# Patient Record
Sex: Female | Born: 1990
Health system: Southern US, Community
[De-identification: ages and names within clinical notes are randomized; demographics above are authoritative.]

## PROBLEM LIST (undated history)

## (undated) DIAGNOSIS — Z9109 Other allergy status, other than to drugs and biological substances: Secondary | ICD-10-CM

## (undated) DIAGNOSIS — D259 Leiomyoma of uterus, unspecified: Secondary | ICD-10-CM

## (undated) DIAGNOSIS — F32A Depression, unspecified: Secondary | ICD-10-CM

## (undated) DIAGNOSIS — M779 Enthesopathy, unspecified: Secondary | ICD-10-CM

## (undated) DIAGNOSIS — F329 Major depressive disorder, single episode, unspecified: Secondary | ICD-10-CM

## (undated) DIAGNOSIS — K429 Umbilical hernia without obstruction or gangrene: Secondary | ICD-10-CM

## (undated) DIAGNOSIS — F419 Anxiety disorder, unspecified: Secondary | ICD-10-CM

## (undated) DIAGNOSIS — T7840XA Allergy, unspecified, initial encounter: Secondary | ICD-10-CM

## (undated) HISTORY — DX: Depression, unspecified: F32.A

## (undated) HISTORY — DX: Leiomyoma of uterus, unspecified: D25.9

## (undated) HISTORY — DX: Anxiety disorder, unspecified: F41.9

## (undated) HISTORY — DX: Other allergy status, other than to drugs and biological substances: Z91.09

## (undated) HISTORY — DX: Enthesopathy, unspecified: M77.9

## (undated) HISTORY — DX: Umbilical hernia without obstruction or gangrene: K42.9

## (undated) HISTORY — DX: Allergy, unspecified, initial encounter: T78.40XA

## (undated) HISTORY — DX: Major depressive disorder, single episode, unspecified: F32.9

---
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
## 2008-07-31 ENCOUNTER — Emergency Department: Payer: Self-pay | Admitting: Emergency Medicine

## 2011-10-09 LAB — HEMOGLOBIN A1C: Hgb A1c MFr Bld: 5 % (ref 4.0–6.0)

## 2012-09-01 ENCOUNTER — Ambulatory Visit: Payer: Self-pay | Admitting: Chiropractic Medicine

## 2012-12-17 ENCOUNTER — Ambulatory Visit: Payer: Self-pay | Admitting: Family Medicine

## 2012-12-17 LAB — HCG, QUANTITATIVE, PREGNANCY: Beta Hcg, Quant.: <1 m[IU]/mL — ABNORMAL LOW

## 2012-12-23 ENCOUNTER — Ambulatory Visit: Payer: Self-pay | Admitting: Family Medicine

## 2013-05-05 HISTORY — PX: MYOMECTOMY: SHX85

## 2013-08-22 ENCOUNTER — Ambulatory Visit: Payer: Self-pay | Admitting: Obstetrics and Gynecology

## 2013-08-22 LAB — CBC WITH DIFFERENTIAL/PLATELET
Basophil #: 0 10*3/uL (ref 0.0–0.1)
Basophil %: 0.3 %
Eosinophil #: 0.1 10*3/uL (ref 0.0–0.7)
Eosinophil %: 1.5 %
HCT: 36.5 % (ref 35.0–47.0)
HGB: 12.3 g/dL (ref 12.0–16.0)
Lymphocyte #: 2.8 10*3/uL (ref 1.0–3.6)
Lymphocyte %: 33.7 %
MCH: 30.5 pg (ref 26.0–34.0)
MCHC: 33.7 g/dL (ref 32.0–36.0)
MCV: 90 fL (ref 80–100)
Monocyte #: 0.4 x10 3/mm (ref 0.2–0.9)
Monocyte %: 5.3 %
Neutrophil #: 4.9 10*3/uL (ref 1.4–6.5)
Neutrophil %: 59.2 %
Platelet: 302 10*3/uL (ref 150–440)
RBC: 4.04 10*6/uL (ref 3.80–5.20)
RDW: 13 % (ref 11.5–14.5)
WBC: 8.3 10*3/uL (ref 3.6–11.0)

## 2013-08-25 LAB — PATHOLOGY REPORT

## 2014-06-05 LAB — HM PAP SMEAR: HM PAP: NORMAL

## 2014-08-26 NOTE — Op Note (Signed)
PATIENT NAME:  Patricia Horn, CRESWELL MR#:  [id number] DATE OF BIRTH:  [birth year]  DATE OF PROCEDURE:  08/22/2013  PREOPERATIVE DIAGNOSES: 1.  Chronic pelvic pain.  2.  Leiomyoma.   POSTOPERATIVE DIAGNOSES: 1.  Chronic pelvic pain.  2.  Leiomyoma.  3.  Possible endometriosis.   PROCEDURES: Laparoscopy with myomectomy x2 and peritoneal biopsies.   SURGEON: Alanda Slim. Jearldean Gutt, M.D.   FIRST ASSISTANT: Dr. Marcelline Mates.   SECOND ASSISTANT: Fredrik Cove, PA-S  ANESTHESIA: General endotracheal.   INDICATIONS: The patient is a 24 year old single African American female, para zero, using Depo-Provera for contraception, who presents for surgical management of chronic pelvic pain. The patient has a long history of severe dysmenorrhea and perimenstrual low backache. Symptoms are improved on Depo-Provera. However, the patient has had worsening symptoms that were associated with defecation with a sense of fullness and pressure on the uterus. Preoperative ultrasound has demonstrated at least 2 fibroids.   FINDINGS AT SURGERY: Revealed a multi-fibroid uterus with a posterior pedunculated 3 cm fibroid and an anterior subserosal fibroid measuring approximately 4 cm in greatest diameter. The peritoneal surface in the cul-de-sac was notable for several white cystic lesions suspicious for endometriosis; left uterosacral ligament also had a white lesion suspicious for endometriosis. There were tubal to posterior cul-de-sac adhesions on the right which were lysed. The ovaries appeared grossly normal. Fallopian tubes appeared grossly normal. The upper abdomen was normal.   DESCRIPTION OF PROCEDURE: The patient was brought to the operating room where she was placed in the supine position. General endotracheal anesthesia was induced without difficulty. She was placed in the dorsal lithotomy position using the bumblebee stirrups. A ChloraPrep and Betadine abdominal, perineal, intravaginal prep and drape was performed in  standard fashion. A red Robinson catheter was used to drain 40 mL of urine from the bladder. A Hulka tenaculum was placed on the cervix to facilitate uterine manipulation. A subumbilical vertical incision 5 mm in length was made. The Optiview laparoscopic trocar was placed into the abdomen under direct visualization without evidence of bowel or vascular injury. A 5 mm port was placed in the suprapubic region above the symphysis pubis and an 11 mm port was placed in the right lower quadrant under direct visualization. The above-noted findings were photo documented. The peritoneal biopsies of the cul-de-sac, left uterosacral ligament, and right tubal adhesions were taken. These were sent to pathology. The Ace Harmonic scalpel was then used to perform myomectomy of the posterior pedunculated fibroid. This was placed in the cul-de-sac for removal. The anterior subserosal fibroid was also excised using a combination of the Ace Harmonic scalpel, the single-tooth grasper instrument as well as a peanut apparatus. Sequentially the serosa of the fibroid was incised with the Ace Harmonic scalpel and this was undermined and dissected in standard fashion. The Harmonic scalpel was used to facilitate cutting and excising of the fibroid from its uterine attachment. This second fibroid was likewise placed in the cul-de-sac for removal. The pelvis was copiously irrigated. The uterine myomectomy sites were assessed and noted to be intact and free of connection with the endometrial cavity. The mild capillary bleeding that was encountered was treated with Kleppinger bipolar forceps. Because of the slow diffuse ooze, decision was made to place Arista along the myomectomy sites, both anterior and posterior. This was followed by placement of Interceed to help prevent adhesion formation. Once satisfied with hemostasis and once the fibroids were extracted using the Endo Catch bag, the procedure was terminated. All instrumentation was removed  from the abdominopelvic cavity. The pneumoperitoneum was released. The incisions in the right lower quadrants were closed using 0 Vicryl on the fascia in a figure-of-eight manner. The skin incisions were closed with subcuticular stitch of 4-0 Vicryl. Dermabond was placed over the incisions. The patient was then awakened, extubated and taken to the recovery room in satisfactory condition.   ESTIMATED BLOOD LOSS: 100 mL.   IV FLUIDS: 800 mL.  URINE OUTPUT: 40 mL.  ____________________________ Alanda Slim. Aqueelah Cotrell, MD mad:sb D: 08/22/2013 12:22:11 ET T: 08/22/2013 13:54:35 ET JOB#: [id number]  cc: Hassell Done A. Daegen Berrocal, MD, <Dictator> Alanda Slim Ekaterini Capitano MD ELECTRONICALLY SIGNED 08/25/2013 11:59

## 2014-10-06 ENCOUNTER — Ambulatory Visit (INDEPENDENT_AMBULATORY_CARE_PROVIDER_SITE_OTHER): Payer: 59 | Admitting: Family Medicine

## 2014-10-06 ENCOUNTER — Encounter: Payer: Self-pay | Admitting: Family Medicine

## 2014-10-06 VITALS — BP 110/84 | HR 100 | Temp 98.4°F | Resp 14 | Ht 62.0 in | Wt 169.6 lb

## 2014-10-06 DIAGNOSIS — Z23 Encounter for immunization: Secondary | ICD-10-CM | POA: Diagnosis not present

## 2014-10-06 DIAGNOSIS — Z113 Encounter for screening for infections with a predominantly sexual mode of transmission: Secondary | ICD-10-CM | POA: Diagnosis not present

## 2014-10-06 DIAGNOSIS — F411 Generalized anxiety disorder: Secondary | ICD-10-CM | POA: Diagnosis not present

## 2014-10-06 DIAGNOSIS — Z8619 Personal history of other infectious and parasitic diseases: Secondary | ICD-10-CM | POA: Insufficient documentation

## 2014-10-06 DIAGNOSIS — R5383 Other fatigue: Secondary | ICD-10-CM | POA: Diagnosis not present

## 2014-10-06 DIAGNOSIS — R768 Other specified abnormal immunological findings in serum: Secondary | ICD-10-CM | POA: Diagnosis not present

## 2014-10-06 DIAGNOSIS — F32A Depression, unspecified: Secondary | ICD-10-CM | POA: Insufficient documentation

## 2014-10-06 DIAGNOSIS — M2550 Pain in unspecified joint: Secondary | ICD-10-CM | POA: Insufficient documentation

## 2014-10-06 DIAGNOSIS — F419 Anxiety disorder, unspecified: Secondary | ICD-10-CM | POA: Insufficient documentation

## 2014-10-06 DIAGNOSIS — Z86018 Personal history of other benign neoplasm: Secondary | ICD-10-CM | POA: Insufficient documentation

## 2014-10-06 NOTE — Progress Notes (Signed)
Name: Patricia Horn   MRN: [id number]    DOB: 25-Sep-1990   Date:10/06/2014       Progress Note  Subjective  Chief Complaint  Chief Complaint  Patient presents with  . Depression    improving with increase dose of medication  . Fatigue    1 month getting worse    HPI  Generalized anxiety disorder: She has been diagnosed with anxiety over the past year and she takes Celexa as prescribed and symptoms are controlled. No panic attacks. Able to work, and goes to school, no affecting her grades and has normal interaction with others. She is happy with medication  Fatigue: She states she has been tired all her life but over the past month she has been feeling tired all the time, and is a different type of tired. No energy, but denies sadness, feels physically tired. No crying, no change in mood. No rashes, occasionally some joint aches. No sob or chest pain Patient Active Problem List   Diagnosis Date Noted  . Anxiety, generalized 10/06/2014  . ANA positive 10/06/2014  . Ache in joint 10/06/2014  . History of uterine fibroid 10/06/2014    Past Surgical History  Procedure Laterality Date  . Hernia repair  1995  . Myomectomy  2015    Family History  Problem Relation Age of Onset  . Hypertension Mother   . Scleroderma Mother   . Pulmonary fibrosis Mother     History   Social History  . Marital Status: Single    Spouse Name: N/A  . Number of Children: N/A  . Years of Education: N/A   Occupational History  . Not on file.   Social History Main Topics  . Smoking status: Never Smoker   . Smokeless tobacco: Never Used  . Alcohol Use: 0.6 oz/week    1 Standard drinks or equivalent per week     Comment: ocassional  . Drug Use: No  . Sexual Activity:    Partners: Male   Other Topics Concern  . Not on file   Social History Narrative  . No narrative on file     Current outpatient prescriptions:  .  citalopram (CELEXA) 20 MG tablet, Take 20 tablets by mouth daily., Disp:  , Rfl:  .  medroxyPROGESTERone (DEPO-PROVERA) 150 MG/ML injection, Inject into the muscle every 3 (three) months., Disp: , Rfl:   No Known Allergies   ROS  Ten systems reviewed and is negative except as mentioned in HPI   Objective  Filed Vitals:   10/06/14 1627  BP: 110/84  Pulse: 100  Temp: 98.4 F (36.9 C)  TempSrc: Oral  Resp: 14  Height: '5\' 2"'  (1.575 m)  Weight: 169 lb 9.6 oz (76.93 kg)  SpO2: 97%    Physical Exam   Constitutional: Patient appears well-developed and well-nourished. No distress.  HENT: Head: Normocephalic and atraumatic. Ears: B TMs ok, no erythema or effusion; Nose: Nose normal. Mouth/Throat: Oropharynx is clear and moist. No oropharyngeal exudate.  Eyes: Conjunctivae and EOM are normal. Pupils are equal, round, and reactive to light. No scleral icterus.  Neck: Normal range of motion. Neck supple. No JVD present. No thyromegaly present.  Cardiovascular: Normal rate, regular rhythm and normal heart sounds.  No murmur heard. No BLE edema. Pulmonary/Chest: Effort normal and breath sounds normal. No respiratory distress. Abdominal: Soft. Bowel sounds are normal, no distension. There is no tenderness. no masses Musculoskeletal: Normal range of motion, no joint effusions. No gross deformities Neurological: he is  alert and oriented to person, place, and time. No cranial nerve deficit. Coordination, balance, strength, speech and gait are normal.  Skin: Skin is warm and dry. No rash noted. No erythema.  Psychiatric: Patient has a normal mood and affect. behavior is normal. Judgment and thought content normal.     Assessment & Plan  1. Anxiety, generalized She states symptoms have been controlled with medication, no sad and feels like anxiety is controlled with Celexa - TSH  2. Other fatigue Worse over the past month, check labs - CBC - Comp Met (CMET)  3. ANA positive Seen by Rheumatologist for joint pains and elevated ANA but work up was neg, we  will recheck today - Antinuclear Antib (ANA)  4. Routine screening for STI (sexually transmitted infection) She wants HIV screen but not genprobe or RPR today - HIV antibody (with reflex)  5. Need for HPV vaccination  - HPV 9-valent vaccine,Recombinat (Gardasil 9) Return in 4 months for HPV #3

## 2014-10-06 NOTE — Patient Instructions (Signed)

## 2014-10-10 LAB — COMPREHENSIVE METABOLIC PANEL
ALBUMIN: 4.2 g/dL (ref 3.5–5.5)
ALK PHOS: 80 IU/L (ref 39–117)
ALT: 11 IU/L (ref 0–32)
AST: 11 IU/L (ref 0–40)
Albumin/Globulin Ratio: 1.7 (ref 1.1–2.5)
BUN/Creatinine Ratio: 16 (ref 8–20)
BUN: 10 mg/dL (ref 6–20)
CALCIUM: 9.6 mg/dL (ref 8.7–10.2)
CO2: 21 mmol/L (ref 18–29)
CREATININE: 0.61 mg/dL (ref 0.57–1.00)
Chloride: 104 mmol/L (ref 97–108)
GFR calc non Af Amer: 126 mL/min/{1.73_m2} (ref >59)
GFR, EST AFRICAN AMERICAN: 145 mL/min/{1.73_m2} (ref >59)
Globulin, Total: 2.5 g/dL (ref 1.5–4.5)
Glucose: 80 mg/dL (ref 65–99)
Potassium: 3.8 mmol/L (ref 3.5–5.2)
Sodium: 142 mmol/L (ref 134–144)
TOTAL PROTEIN: 6.7 g/dL (ref 6.0–8.5)

## 2014-10-10 LAB — CBC
Hematocrit: 39.9 % (ref 34.0–46.6)
Hemoglobin: 13.2 g/dL (ref 11.1–15.9)
MCH: 30.4 pg (ref 26.6–33.0)
MCHC: 33.1 g/dL (ref 31.5–35.7)
MCV: 92 fL (ref 79–97)
PLATELETS: 333 10*3/uL (ref 150–379)
RBC: 4.34 x10E6/uL (ref 3.77–5.28)
RDW: 13 % (ref 12.3–15.4)
WBC: 7.7 10*3/uL (ref 3.4–10.8)

## 2014-10-10 LAB — HIV ANTIBODY (ROUTINE TESTING W REFLEX): HIV SCREEN 4TH GENERATION: NONREACTIVE

## 2014-10-10 LAB — TSH: TSH: 2.48 u[IU]/mL (ref 0.450–4.500)

## 2014-10-10 LAB — ANA: Anti Nuclear Antibody(ANA): NEGATIVE

## 2014-10-30 ENCOUNTER — Telehealth: Payer: Self-pay | Admitting: Family Medicine

## 2014-10-30 NOTE — Telephone Encounter (Signed)
Pt requesting return call for lab test results. [phone]

## 2014-10-30 NOTE — Telephone Encounter (Signed)
Patient notified of lab results

## 2014-10-30 NOTE — Telephone Encounter (Signed)
Ok thank you 

## 2014-11-16 ENCOUNTER — Encounter: Payer: Self-pay | Admitting: Family Medicine

## 2014-11-16 ENCOUNTER — Encounter (INDEPENDENT_AMBULATORY_CARE_PROVIDER_SITE_OTHER): Payer: Self-pay

## 2014-11-16 ENCOUNTER — Ambulatory Visit (INDEPENDENT_AMBULATORY_CARE_PROVIDER_SITE_OTHER): Payer: 59 | Admitting: Family Medicine

## 2014-11-16 VITALS — BP 124/86 | HR 112 | Temp 99.2°F | Resp 14 | Ht 62.0 in | Wt 173.5 lb

## 2014-11-16 DIAGNOSIS — J302 Other seasonal allergic rhinitis: Secondary | ICD-10-CM | POA: Diagnosis not present

## 2014-11-16 DIAGNOSIS — R1033 Periumbilical pain: Secondary | ICD-10-CM

## 2014-11-16 DIAGNOSIS — J3089 Other allergic rhinitis: Secondary | ICD-10-CM | POA: Insufficient documentation

## 2014-11-16 MED ORDER — FLUTICASONE PROPIONATE 50 MCG/ACT NA SUSP: 2.0000 | Freq: Every day | NASAL | Status: DC

## 2014-11-16 MED ORDER — HYDROCODONE-ACETAMINOPHEN 5-325 MG PO TABS: 1.0000 | ORAL_TABLET | Freq: Four times a day (QID) | ORAL | Status: DC | PRN

## 2014-11-16 MED ORDER — NAPROXEN 500 MG PO TABS
500.0000 mg | ORAL_TABLET | Freq: Two times a day (BID) | ORAL | Status: DC
Start: 2014-11-16 — End: 2014-11-24

## 2014-11-16 MED ORDER — LORATADINE 10 MG PO TABS: 10.0000 mg | ORAL_TABLET | Freq: Every day | ORAL | Status: DC

## 2014-11-16 NOTE — Progress Notes (Signed)
Name: Patricia Horn   MRN: [id number]    DOB: 1990-06-17   Date:11/16/2014       Progress Note  Subjective  Chief Complaint  Chief Complaint  Patient presents with  . 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 Hx of umblical hernia as a chils    HPI  Umbilical pain: developed it Sunday am.  She states she worked on an event on Saturday, lifting tables and chairs.  She has a history of umbilical repair surgery at age 35 yo and also had an ovarian cyst removed by laparoscopic surgery last year. The pain was more intense initially, but now is more constant, described as sharp, worse with touch and movement, no nausea, vomiting or diarrhea. No fever, no redness or drainage noticed.   Allergic Rhinitis: she states symptoms are worse this time of the year, having post-nasal drainage, nasal congestion, headaches.   Patient Active Problem List   Diagnosis Date Noted  . Anxiety, generalized 10/06/2014  . ANA positive 10/06/2014  . Ache in joint 10/06/2014  . History of uterine fibroid 10/06/2014    Past Surgical History  Procedure Laterality Date  . Hernia repair  1995  . Myomectomy  2015    Family History  Problem Relation Age of Onset  . Hypertension Mother   . Scleroderma Mother   . Pulmonary fibrosis Mother     History   Social History  . Marital Status: Single    Spouse Name: N/A  . Number of Children: N/A  . Years of Education: N/A   Occupational History  . Not on file.   Social History Main Topics  . Smoking status: Never Smoker   . Smokeless tobacco: Never Used  . Alcohol Use: 0.6 oz/week    1 Standard drinks or equivalent per week     Comment: ocassional  . Drug Use: No  . Sexual Activity:    Partners: Male   Other Topics Concern  . Not on file   Social History Narrative     Current outpatient prescriptions:  .  citalopram  (CELEXA) 20 MG tablet, Take 20 tablets by mouth daily., Disp: , Rfl:  .  medroxyPROGESTERone (DEPO-PROVERA) 150 MG/ML injection, Inject into the muscle every 3 (three) months., Disp: , Rfl:   No Known Allergies   ROS  Ten systems reviewed and is negative except as mentioned in HPI  Objective  Filed Vitals:   11/16/14 1447  BP: 124/86  Pulse: 112  Temp: 99.2 F (37.3 C)  TempSrc: Oral  Resp: 14  Height: '5\' 2"'  (1.575 m)  Weight: 173 lb 8 oz (78.699 kg)  SpO2: 99%    Body mass index is 31.73 kg/(m^2).  Physical Exam Constitutional: Patient appears well-developed and obese. No distress.  Eyes:  No scleral icterus.  Neck: Normal range of motion. Neck supple. Nares : bogie,  Cardiovascular: Normal rate, regular rhythm and normal heart sounds.  No murmur heard. No BLE edema. Pulmonary/Chest: Effort normal and breath sounds normal. No respiratory distress. Abdominal: Soft.  There is tenderness during palpation of umbilical area.  No redness or increase in warmth, no drainage, very tender to touch, scarring felt Psychiatric: Patient has a normal mood and affect. behavior is normal. Judgment and thought content normal.  Recent Results (from the past 2160 hour(s))  CBC     Status: None  Collection Time: 10/09/14  4:04 PM  Result Value Ref Range   WBC 7.7 3.4 - 10.8 x10E3/uL   RBC 4.34 3.77 - 5.28 x10E6/uL   Hemoglobin 13.2 11.1 - 15.9 g/dL   Hematocrit 39.9 34.0 - 46.6 %   MCV 92 79 - 97 fL   MCH 30.4 26.6 - 33.0 pg   MCHC 33.1 31.5 - 35.7 g/dL   RDW 13.0 12.3 - 15.4 %   Platelets 333 150 - 379 x10E3/uL  TSH     Status: None   Collection Time: 10/09/14  4:04 PM  Result Value Ref Range   TSH 2.480 0.450 - 4.500 uIU/mL  Antinuclear Antib (ANA)     Status: None   Collection Time: 10/09/14  4:04 PM  Result Value Ref Range   Anit Nuclear Antibody(ANA) Negative Negative  Comp Met (CMET)     Status: None   Collection Time: 10/09/14  4:04 PM  Result Value Ref Range   Glucose  80 65 - 99 mg/dL   BUN 10 6 - 20 mg/dL   Creatinine, Ser 0.61 0.57 - 1.00 mg/dL   GFR calc non Af Amer 126 >59 mL/min/1.73   GFR calc Af Amer 145 >59 mL/min/1.73   BUN/Creatinine Ratio 16 8 - 20   Sodium 142 134 - 144 mmol/L   Potassium 3.8 3.5 - 5.2 mmol/L   Chloride 104 97 - 108 mmol/L   CO2 21 18 - 29 mmol/L   Calcium 9.6 8.7 - 10.2 mg/dL   Total Protein 6.7 6.0 - 8.5 g/dL   Albumin 4.2 3.5 - 5.5 g/dL   Globulin, Total 2.5 1.5 - 4.5 g/dL   Albumin/Globulin Ratio 1.7 1.1 - 2.5   Bilirubin Total <0.2 0.0 - 1.2 mg/dL   Alkaline Phosphatase 80 39 - 117 IU/L   AST 11 0 - 40 IU/L   ALT 11 0 - 32 IU/L  HIV antibody (with reflex)     Status: None   Collection Time: 10/09/14  4:04 PM  Result Value Ref Range   HIV Screen 4th Generation wRfx Non Reactive Non Reactive     PHQ2/9: Depression screen PHQ 2/9 10/06/2014  Decreased Interest 0  Down, Depressed, Hopeless 0  PHQ - 2 Score 0     Fall Risk: Fall Risk  10/06/2014  Falls in the past year? No     Assessment & Plan  1. Umbilical pain Advised to continue NSAID'S , likely irritation but it may be another umbilical hernia versus scar tissue, refer to surgeon since symptoms are present past 5 days. Spoke to Dr. Jamal Collin, he advised to try pain medication and nsaid's first and if not better see him next week - Ambulatory referral to General Surgery  2. Other seasonal allergic rhinitis  - fluticasone (FLONASE) 50 MCG/ACT nasal spray; Place 2 sprays into both nostrils daily.  Dispense: 16 g; Refill: 5 - loratadine (CLARITIN) 10 MG tablet; Take 1 tablet (10 mg total) by mouth daily.  Dispense: 30 tablet; Refill: 5

## 2014-11-23 ENCOUNTER — Telehealth: Payer: Self-pay | Admitting: Family Medicine

## 2014-11-23 NOTE — Telephone Encounter (Signed)
Mom called for daughter to inform you that pt is still having issues with her belly button. She states she is now having oozing out what seems to be pus. Please advise as to what should be done.

## 2014-11-23 NOTE — Telephone Encounter (Signed)
Pt is going to schedule appt in am with Dr. Nadine Counts

## 2014-11-24 ENCOUNTER — Encounter: Payer: Self-pay | Admitting: Family Medicine

## 2014-11-24 ENCOUNTER — Ambulatory Visit (INDEPENDENT_AMBULATORY_CARE_PROVIDER_SITE_OTHER): Payer: 59

## 2014-11-24 ENCOUNTER — Encounter (INDEPENDENT_AMBULATORY_CARE_PROVIDER_SITE_OTHER): Payer: Self-pay

## 2014-11-24 ENCOUNTER — Ambulatory Visit (INDEPENDENT_AMBULATORY_CARE_PROVIDER_SITE_OTHER): Payer: 59 | Admitting: Family Medicine

## 2014-11-24 VITALS — BP 96/63 | HR 96 | Ht 62.0 in | Wt 173.4 lb

## 2014-11-24 VITALS — BP 116/72 | HR 101 | Temp 98.2°F | Resp 16 | Ht 62.0 in | Wt 172.3 lb

## 2014-11-24 DIAGNOSIS — Z3042 Encounter for surveillance of injectable contraceptive: Secondary | ICD-10-CM

## 2014-11-24 DIAGNOSIS — R1033 Periumbilical pain: Secondary | ICD-10-CM | POA: Diagnosis not present

## 2014-11-24 DIAGNOSIS — J0100 Acute maxillary sinusitis, unspecified: Secondary | ICD-10-CM

## 2014-11-24 MED ORDER — BENZONATATE 200 MG PO CAPS: 200.0000 mg | ORAL_CAPSULE | Freq: Three times a day (TID) | ORAL | Status: DC | PRN

## 2014-11-24 MED ORDER — MEDROXYPROGESTERONE ACETATE 150 MG/ML IM SUSP
150.0000 mg | Freq: Once | INTRAMUSCULAR | Status: AC
  Administered 2014-11-24: 150 mg via INTRAMUSCULAR

## 2014-11-24 MED ORDER — AMOXICILLIN-POT CLAVULANATE 875-125 MG PO TABS: 1.0000 | ORAL_TABLET | Freq: Two times a day (BID) | ORAL | Status: DC

## 2014-11-24 NOTE — Progress Notes (Signed)
Patient ID: Patricia Horn, female   DOB: [birth year], 24 y.o.   MRN: [id number] Pt presents for depo inj only.  No s/e from medication.  Recently  Seen by  pcp dx with umbilical hernia- schedule to see Dr Jamal Collin soon.

## 2014-11-24 NOTE — Progress Notes (Signed)
Name: Patricia Horn   MRN: [id number]    DOB: 1991-04-30   Date:11/24/2014       Progress Note  Subjective  Chief Complaint  Chief Complaint  Patient presents with  . Cyst    Pt saw Dr. Ancil Boozer last week for belly button pain, she was given naproxen and pain med.   Since then she has had a cyst to form and drainage of pus.    HPI  Abdominal Pain: Patient complains of abdominal pain and is here for follow up from her initial visit on 11/16/14 with her PCP Dr. Ancil Boozer for the same concerns. The pain is located in the periumbilical area. The pain is described as aching, and is 3/10 in intensity. Onset was 2 weeks ago maybe related to lifting heavy items and since then she has noticed recent blood drainage from her umbilicus. Symptoms have been unchanged since. Aggravating factors include pressure.  Alleviating factors include NSAIDs and recumbency. Associated symptoms include none. The patient denies anorexia, belching, chills, constipation, diarrhea, dysuria, fever, frequency, headache, hematochezia, hematuria, melena, nausea and vomiting.  At her previous visit she was provided pain medication Norco 5-393m and instructed to use as needed. She was also referred to Dr. SJamal Collingeneral surgery but has not secured an appointment yet as it was going to be based on urgency of need or if her symptoms settled down. Also she notes chronic coughing lately and sinus pain and drainage.   Sinusitis: Patient presents with chronic sinusitis. The patient reports chronic sinus infections for a few weeks.  Her symptoms include nasal congestion, cough, frequent clearing of the throat.  There has not been a history of fevers. There has not been a history of chronic otitis media or pharyngotonsillitis.  Prior antibiotic therapy has included no recent courses. Other medications have included nothing additional.  She has not had allergy testing which was not done.       Patient Active Problem List   Diagnosis Date Noted   . Umbilical pain 095/28/4132 . Acute maxillary sinusitis 11/24/2014  . Other seasonal allergic rhinitis 11/16/2014  . Anxiety, generalized 10/06/2014  . ANA positive 10/06/2014  . Ache in joint 10/06/2014  . History of uterine fibroid 10/06/2014    History  Substance Use Topics  . Smoking status: Never Smoker   . Smokeless tobacco: Never Used  . Alcohol Use: 0.6 oz/week    1 Standard drinks or equivalent per week     Comment: ocassional     Current outpatient prescriptions:  .  amoxicillin-clavulanate (AUGMENTIN) 875-125 MG per tablet, Take 1 tablet by mouth 2 (two) times daily., Disp: 14 tablet, Rfl: 0 .  benzonatate (TESSALON) 200 MG capsule, Take 1 capsule (200 mg total) by mouth 3 (three) times daily as needed for cough., Disp: 30 capsule, Rfl: 0 .  citalopram (CELEXA) 20 MG tablet, Take 20 tablets by mouth daily., Disp: , Rfl:  .  fluticasone (FLONASE) 50 MCG/ACT nasal spray, Place 2 sprays into both nostrils daily., Disp: 16 g, Rfl: 5 .  HYDROcodone-acetaminophen (NORCO/VICODIN) 5-325 MG per tablet, Take 1 tablet by mouth every 6 (six) hours as needed for moderate pain., Disp: 20 tablet, Rfl: 0 .  loratadine (CLARITIN) 10 MG tablet, Take 1 tablet (10 mg total) by mouth daily., Disp: 30 tablet, Rfl: 5 .  medroxyPROGESTERone (DEPO-PROVERA) 150 MG/ML injection, Inject into the muscle every 3 (three) months., Disp: , Rfl:  .  naproxen (NAPROSYN) 500 MG tablet, Take 1 tablet (500 mg  total) by mouth 2 (two) times daily with a meal., Disp: 60 tablet, Rfl: 0  Past Surgical History  Procedure Laterality Date  . Hernia repair  1995  . Myomectomy  2015    Family History  Problem Relation Age of Onset  . Hypertension Mother   . Scleroderma Mother   . Pulmonary fibrosis Mother     No Known Allergies   Review of Systems  CONSTITUTIONAL: No significant weight changes, fever, chills, weakness or fatigue.  SKIN: No rash or itching.  CARDIOVASCULAR: No chest pain, chest  pressure or chest discomfort. No palpitations or edema.  RESPIRATORY: No shortness of breath. Yes Cough.  GASTROINTESTINAL: No anorexia, nausea, vomiting. No changes in bowel habits. Yes abdominal pain. GENITOURINARY: No dysuria. No frequency. No discharge.  NEUROLOGICAL: No headache, dizziness, syncope, paralysis, ataxia, numbness or tingling in the extremities. No memory changes. No change in bowel or bladder control.  MUSCULOSKELETAL: No joint pain. No muscle pain. HEMATOLOGIC: No anemia, bleeding or bruising.  LYMPHATICS: No enlarged lymph nodes.  PSYCHIATRIC: No change in mood. No change in sleep pattern.  ENDOCRINOLOGIC: No reports of sweating, cold or heat intolerance. No polyuria or polydipsia.      Objective  BP 116/72 mmHg  Pulse 101  Temp(Src) 98.2 F (36.8 C) (Oral)  Resp 16  Ht 5' 2" (1.575 m)  Wt 172 lb 4.8 oz (78.155 kg)  BMI 31.51 kg/m2  SpO2 97% Body mass index is 31.51 kg/(m^2).  Physical Exam  Constitutional: Patient appears well-developed and well-nourished. In no distress.  HEENT:  - Head: Normocephalic and atraumatic.  - Ears: Bilateral TMs gray, no erythema or effusion - Nose: Nasal mucosa moist boggy and congested - Mouth/Throat: Oropharynx is clear and moist. No tonsillar hypertrophy or erythema. Positive post nasal drainage.  - Eyes: Conjunctivae clear, EOM movements normal. PERRLA. No scleral icterus.  Neck: Normal range of motion. Neck supple. No JVD present. No thyromegaly present.  Cardiovascular: Normal rate, regular rhythm and normal heart sounds.  No murmur heard.  Pulmonary/Chest: Effort normal and breath sounds normal. No respiratory distress. Abdomen: Soft with normal bowel sounds throughout all quadrants, no guarding, no rebound, negative McBurney's sign negative. On closer exam of the umbilicus she has a 0.3mm raised lesion at the 6 o'clock position of umbilical diameter with deep seeded dark crusted material. Tenderness around this area.  Protrusion not worsened with increased internal pressure.  Musculoskeletal: Normal range of motion bilateral UE and LE, no joint effusions. Peripheral vascular: Bilateral LE no edema. Skin: Skin is warm and dry. No rash noted. No erythema.  Psychiatric: Patient is anxious. Behavior is normal in office today. Judgment and thought content normal in office today.    Recent Results (from the past 2160 hour(s))  CBC     Status: None   Collection Time: 10/09/14  4:04 PM  Result Value Ref Range   WBC 7.7 3.4 - 10.8 x10E3/uL   RBC 4.34 3.77 - 5.28 x10E6/uL   Hemoglobin 13.2 11.1 - 15.9 g/dL   Hematocrit 39.9 34.0 - 46.6 %   MCV 92 79 - 97 fL   MCH 30.4 26.6 - 33.0 pg   MCHC 33.1 31.5 - 35.7 g/dL   RDW 13.0 12.3 - 15.4 %   Platelets 333 150 - 379 x10E3/uL  TSH     Status: None   Collection Time: 10/09/14  4:04 PM  Result Value Ref Range   TSH 2.480 0.450 - 4.500 uIU/mL  Antinuclear Antib (ANA)       Status: None   Collection Time: 10/09/14  4:04 PM  Result Value Ref Range   Anit Nuclear Antibody(ANA) Negative Negative  Comp Met (CMET)     Status: None   Collection Time: 10/09/14  4:04 PM  Result Value Ref Range   Glucose 80 65 - 99 mg/dL   BUN 10 6 - 20 mg/dL   Creatinine, Ser 0.61 0.57 - 1.00 mg/dL   GFR calc non Af Amer 126 >59 mL/min/1.73   GFR calc Af Amer 145 >59 mL/min/1.73   BUN/Creatinine Ratio 16 8 - 20   Sodium 142 134 - 144 mmol/L   Potassium 3.8 3.5 - 5.2 mmol/L   Chloride 104 97 - 108 mmol/L   CO2 21 18 - 29 mmol/L   Calcium 9.6 8.7 - 10.2 mg/dL   Total Protein 6.7 6.0 - 8.5 g/dL   Albumin 4.2 3.5 - 5.5 g/dL   Globulin, Total 2.5 1.5 - 4.5 g/dL   Albumin/Globulin Ratio 1.7 1.1 - 2.5   Bilirubin Total <0.2 0.0 - 1.2 mg/dL   Alkaline Phosphatase 80 39 - 117 IU/L   AST 11 0 - 40 IU/L   ALT 11 0 - 32 IU/L  HIV antibody (with reflex)     Status: None   Collection Time: 10/09/14  4:04 PM  Result Value Ref Range   HIV Screen 4th Generation wRfx Non Reactive Non  Reactive     Assessment & Plan  1. Umbilical pain I cleaned out the umbilicus with alcohol swabs and removed significant debris using tweezers and gentle tugging. A foreign body (Lima bean size) with fibrous hairlike particles suggesting foreign body impaction was removed, likely causing the irritation and bleeding. On further questioning the patient admitted to using Q-tips to clean her umbilicus. I suspect that over time fibers of cotton from the Q-tips had lodged into her umbilicus and had collected secretions and irritated the area causing localized inflammation. I placed the specimen in a container and asked patient to hold onto it and show Dr. Sankar. Area cleaned further and triple antibiotic applied, instructed patient to keep area clean but do not put anything into the umbilicus other than triple abx. I would still like for her to consult with Dr. Sankar regarding the 6 o'clock prominence which may be an recurrent umbilical hernia or just soft tissue swelling/irritation.   2. Acute maxillary sinusitis, recurrence not specified Patient last minute mentioned she had a cough and sinus drainage as well.   - benzonatate (TESSALON) 200 MG capsule; Take 1 capsule (200 mg total) by mouth 3 (three) times daily as needed for cough.  Dispense: 30 capsule; Refill: 0 - amoxicillin-clavulanate (AUGMENTIN) 875-125 MG per tablet; Take 1 tablet by mouth 2 (two) times daily.  Dispense: 14 tablet; Refill: 0   

## 2014-11-29 ENCOUNTER — Ambulatory Visit (INDEPENDENT_AMBULATORY_CARE_PROVIDER_SITE_OTHER): Payer: 59 | Admitting: General Surgery

## 2014-11-29 ENCOUNTER — Encounter: Payer: Self-pay | Admitting: General Surgery

## 2014-11-29 VITALS — BP 118/72 | HR 90 | Resp 12 | Ht 62.0 in | Wt 167.0 lb

## 2014-11-29 DIAGNOSIS — Q898 Other specified congenital malformations: Secondary | ICD-10-CM

## 2014-11-29 DIAGNOSIS — IMO0002 Reserved for concepts with insufficient information to code with codable children: Secondary | ICD-10-CM

## 2014-11-29 NOTE — Progress Notes (Signed)
Patient ID: Patricia Horn, female   DOB: March 05, 1991, 24 y.o.   MRN: [id number]  Chief Complaint  Patient presents with  . Hernia    HPI Patricia Horn is a 24 y.o. female.  Here today for evaluation of an umbilical hernia. Patient noticed this area about two weeks ago. No change in size there was so blood that was coming out of the umbilical area. Saw Dr. Nadine Counts last week and she pulled out a foreign body out of the umbilicus. The patient brought this with her for review. She's had less discomfort since this was removed.    HPI  Past Medical History  Diagnosis Date  . Depression   . Environmental allergies   . Umbilical hernia   . Anxiety     Past Surgical History  Procedure Laterality Date  . Myomectomy  2015  . Hernia repair  3710    umbilical     Family History  Problem Relation Age of Onset  . Hypertension Mother   . Scleroderma Mother   . Pulmonary fibrosis Mother     Social History History  Substance Use Topics  . Smoking status: Never Smoker   . Smokeless tobacco: Never Used  . Alcohol Use: 0.6 oz/week    1 Standard drinks or equivalent per week     Comment: ocassional    No Known Allergies  Current Outpatient Prescriptions  Medication Sig Dispense Refill  . amoxicillin-clavulanate (AUGMENTIN) 875-125 MG per tablet Take 1 tablet by mouth 2 (two) times daily. 14 tablet 0  . benzonatate (TESSALON) 200 MG capsule Take 1 capsule (200 mg total) by mouth 3 (three) times daily as needed for cough. 30 capsule 0  . citalopram (CELEXA) 20 MG tablet Take 20 tablets by mouth daily.    . fluticasone (FLONASE) 50 MCG/ACT nasal spray Place 2 sprays into both nostrils daily. 16 g 5  . loratadine (CLARITIN) 10 MG tablet Take 1 tablet (10 mg total) by mouth daily. 30 tablet 5  . medroxyPROGESTERone (DEPO-PROVERA) 150 MG/ML injection Inject into the muscle every 3 (three) months.     No current facility-administered medications for this visit.    Review of  Systems Review of Systems  Constitutional: Negative.   Respiratory: Negative.   Cardiovascular: Negative.     Blood pressure 118/72, pulse 90, resp. rate 12, height 5\' 2"  (1.575 m), weight 167 lb (75.751 kg).  Physical Exam Physical Exam  Constitutional: She is oriented to person, place, and time. She appears well-developed and well-nourished.  Eyes: Conjunctivae are normal. No scleral icterus.  Neck: Neck supple.  Cardiovascular: Normal rate, regular rhythm and normal heart sounds.   Pulmonary/Chest: Effort normal and breath sounds normal.  Abdominal: Soft. Normal appearance and bowel sounds are normal. There is no hepatomegaly.    Lymphadenopathy:    She has no cervical adenopathy.  Neurological: She is alert and oriented to person, place, and time.  Skin: Skin is warm and dry.    Data Reviewed Material brought with the patient appears to be a keratin plug from the umbilical skin. I don't believe this is from the umbilical cyst at the lower edge of the original incision.  Assessment    Umbilical cyst.    Plan        Patient to return for belly button cyst excision.  PCP:  Frazier Butt 11/30/2014, 7:09 PM

## 2014-11-29 NOTE — Patient Instructions (Addendum)
Patient to return for belly button cyst excision.

## 2014-11-30 DIAGNOSIS — IMO0002 Reserved for concepts with insufficient information to code with codable children: Secondary | ICD-10-CM | POA: Insufficient documentation

## 2014-12-07 ENCOUNTER — Encounter: Payer: Self-pay | Admitting: Family Medicine

## 2014-12-11 ENCOUNTER — Telehealth: Payer: Self-pay | Admitting: Family Medicine

## 2014-12-11 ENCOUNTER — Telehealth: Payer: Self-pay

## 2014-12-11 ENCOUNTER — Encounter: Payer: Self-pay | Admitting: Family Medicine

## 2014-12-11 DIAGNOSIS — J0100 Acute maxillary sinusitis, unspecified: Secondary | ICD-10-CM

## 2014-12-11 MED ORDER — BENZONATATE 200 MG PO CAPS: 200.0000 mg | ORAL_CAPSULE | Freq: Three times a day (TID) | ORAL | Status: DC | PRN

## 2014-12-11 NOTE — Telephone Encounter (Signed)
Tussionex cannot be called in, does she want the tessalon perles? Benzonate that I gave her last time?

## 2014-12-11 NOTE — Telephone Encounter (Signed)
Yes call in [place]

## 2014-12-11 NOTE — Telephone Encounter (Signed)
Pt wants to know if you could call her something in for her cough. She is still dealing with the same cough that you treated her for a few weeks ago. Pt is asking for you to call her in some T[place]x until she can get in to see you. Patricia Horn

## 2014-12-14 ENCOUNTER — Encounter: Payer: Self-pay | Admitting: General Surgery

## 2014-12-14 ENCOUNTER — Ambulatory Visit (INDEPENDENT_AMBULATORY_CARE_PROVIDER_SITE_OTHER): Payer: 59 | Admitting: General Surgery

## 2014-12-14 VITALS — BP 96/64 | HR 88 | Resp 14 | Ht 62.0 in | Wt 177.0 lb

## 2014-12-14 DIAGNOSIS — IMO0002 Reserved for concepts with insufficient information to code with codable children: Secondary | ICD-10-CM

## 2014-12-14 DIAGNOSIS — Q898 Other specified congenital malformations: Secondary | ICD-10-CM

## 2014-12-14 NOTE — Progress Notes (Signed)
Patient ID: Patricia Horn, female   DOB: October 13, 1990, 24 y.o.   MRN: [id number]  Chief Complaint  Patient presents with  . Routine Post Op    excision umbilical cyst    HPI Patricia Horn is a 24 y.o. female here today for a excision umbilical cyst. She feels like the area "is not there any more", and is not sure if anything needs to be done.   HPI  Past Medical History  Diagnosis Date  . Depression   . Environmental allergies   . Umbilical hernia   . Anxiety     Past Surgical History  Procedure Laterality Date  . Myomectomy  2015  . Hernia repair  7846    umbilical     Family History  Problem Relation Age of Onset  . Hypertension Mother   . Scleroderma Mother   . Pulmonary fibrosis Mother     Social History Social History  Substance Use Topics  . Smoking status: Never Smoker   . Smokeless tobacco: Never Used  . Alcohol Use: 0.6 oz/week    1 Standard drinks or equivalent per week     Comment: ocassional    No Known Allergies  Current Outpatient Prescriptions  Medication Sig Dispense Refill  . citalopram (CELEXA) 20 MG tablet Take 20 tablets by mouth daily.    . fluticasone (FLONASE) 50 MCG/ACT nasal spray Place 2 sprays into both nostrils daily. 16 g 5  . loratadine (CLARITIN) 10 MG tablet Take 1 tablet (10 mg total) by mouth daily. 30 tablet 5  . medroxyPROGESTERone (DEPO-PROVERA) 150 MG/ML injection Inject into the muscle every 3 (three) months.     No current facility-administered medications for this visit.    Review of Systems Review of Systems  Constitutional: Negative.   Respiratory: Negative.   Cardiovascular: Negative.     Blood pressure 96/64, pulse 88, resp. rate 14, height 5\' 2"  (1.575 m), weight 177 lb (80.287 kg).  Physical Exam Physical Exam  Constitutional: She is oriented to person, place, and time. She appears well-developed.  Abdominal:    Neurological: She is alert and oriented to person, place, and time.  Skin: Skin is warm  and dry.  Psychiatric: Her behavior is normal.      Assessment    Resolution of umbilical cyst.     Plan    The patient will notify the office if she experiences any more discomfort in the area. Follow up otherwise will be on an as-needed basis.     Follow up as needed or if symptoms return.  PCP:  Frazier Butt 12/14/2014, 10:13 PM

## 2014-12-14 NOTE — Patient Instructions (Signed)
The patient is aware to call back for any questions or concerns.  

## 2014-12-17 ENCOUNTER — Encounter: Payer: Self-pay | Admitting: Family Medicine

## 2014-12-19 ENCOUNTER — Ambulatory Visit (INDEPENDENT_AMBULATORY_CARE_PROVIDER_SITE_OTHER): Payer: 59 | Admitting: Family Medicine

## 2014-12-19 ENCOUNTER — Encounter: Payer: Self-pay | Admitting: Family Medicine

## 2014-12-19 VITALS — BP 108/68 | HR 100 | Temp 99.1°F | Resp 22 | Ht 63.25 in | Wt 175.7 lb

## 2014-12-19 DIAGNOSIS — R05 Cough: Secondary | ICD-10-CM | POA: Diagnosis not present

## 2014-12-19 DIAGNOSIS — R059 Cough, unspecified: Secondary | ICD-10-CM

## 2014-12-19 MED ORDER — AZITHROMYCIN 250 MG PO TABS: 500.0000 mg | ORAL_TABLET | Freq: Once | ORAL | Status: DC

## 2014-12-19 MED ORDER — FLUTICASONE-SALMETEROL 250-50 MCG/DOSE IN AEPB: 1.0000 | INHALATION_SPRAY | Freq: Two times a day (BID) | RESPIRATORY_TRACT | Status: DC

## 2014-12-19 MED ORDER — HYDROCOD POLST-CPM POLST ER 10-8 MG/5ML PO SUER: 5.0000 mL | Freq: Two times a day (BID) | ORAL | Status: DC | PRN

## 2014-12-19 MED ORDER — PREDNISONE 20 MG PO TABS: 20.0000 mg | ORAL_TABLET | Freq: Every day | ORAL | Status: DC

## 2014-12-19 NOTE — Progress Notes (Signed)
Name: Patricia Horn   MRN: [id number]    DOB: 09-14-1990   Date:12/19/2014       Progress Note  Subjective  Chief Complaint  Chief Complaint  Patient presents with  . Cough    Onset 4 weeks, Yellow Mucus, SOB, Runny Nose, Bilateral Ear Pressure, Wheezing, Body Aches. Worsening. Has tried OTC Zytrec, Flonase, Delsym. Dr. Nadine Counts gave patient cough medication and Amoxicillin but symptoms are unchanged.    HPI  Cough: she was sick four weeks, seen by Dr. Nadine Counts took Amoxicillin and Tessalon perles but symptoms are not improving. Cough is dry, barky and constant. She can't rest at night. She states abdominal wall muscles are sore from cough. She states is hard to catch her breast when coughing. Her mother has pulmonary fibrosis and is very concerned about a infectious cause of her cough.  Patient Active Problem List   Diagnosis Date Noted  . Umbilical cyst 03/50/0938  . Other seasonal allergic rhinitis 11/16/2014  . Anxiety, generalized 10/06/2014  . ANA positive 10/06/2014  . Ache in joint 10/06/2014  . History of uterine fibroid 10/06/2014    Past Surgical History  Procedure Laterality Date  . Myomectomy  2015  . Hernia repair  1829    umbilical     Family History  Problem Relation Age of Onset  . Hypertension Mother   . Scleroderma Mother   . Pulmonary fibrosis Mother   . Asthma Mother     Social History   Social History  . Marital Status: Single    Spouse Name: N/A  . Number of Children: N/A  . Years of Education: N/A   Occupational History  . Not on file.   Social History Main Topics  . Smoking status: Never Smoker   . Smokeless tobacco: Never Used  . Alcohol Use: 0.6 oz/week    1 Standard drinks or equivalent per week     Comment: ocassional  . Drug Use: No  . Sexual Activity:    Partners: Male   Other Topics Concern  . Not on file   Social History Narrative     Current outpatient prescriptions:  .  citalopram (CELEXA) 20 MG tablet, Take 20  tablets by mouth daily., Disp: , Rfl:  .  fluticasone (FLONASE) 50 MCG/ACT nasal spray, Place 2 sprays into both nostrils daily., Disp: 16 g, Rfl: 5 .  loratadine (CLARITIN) 10 MG tablet, Take 1 tablet (10 mg total) by mouth daily., Disp: 30 tablet, Rfl: 5 .  medroxyPROGESTERone (DEPO-PROVERA) 150 MG/ML injection, Inject into the muscle every 3 (three) months., Disp: , Rfl:  .  azithromycin (ZITHROMAX) 250 MG tablet, Take 2 tablets (500 mg total) by mouth once. And one daily ( 250 ) for the following 4 days, Disp: 6 each, Rfl: 0 .  chlorpheniramine-HYDROcodone (TUSSIONEX PENNKINETIC ER) 10-8 MG/5ML SUER, Take 5 mLs by mouth every 12 (twelve) hours as needed for cough., Disp: 140 mL, Rfl: 0 .  Fluticasone-Salmeterol (ADVAIR DISKUS) 250-50 MCG/DOSE AEPB, Inhale 1 puff into the lungs 2 (two) times daily., Disp: 1 each, Rfl: 0 .  predniSONE (DELTASONE) 20 MG tablet, Take 1 tablet (20 mg total) by mouth daily with breakfast., Disp: 10 tablet, Rfl: 0  No Known Allergies   ROS  Constitutional: Negative for fever or weight change.  Respiratory: Posterior  for cough negative shortness of breath.   Cardiovascular: Negative for chest pain or palpitations.  Gastrointestinal: Negative for abdominal pain, no bowel changes.  Musculoskeletal: Negative for gait problem  or joint swelling.  Skin: Negative for rash.  Neurological: Negative for dizziness or headache.  No other specific complaints in a complete review of systems (except as listed in HPI above).  Objective  Filed Vitals:   12/19/14 0937  BP: 108/68  Pulse: 100  Temp: 99.1 F (37.3 C)  TempSrc: Oral  Resp: 22  Height: 5' 3.25" (1.607 m)  Weight: 175 lb 11.2 oz (79.697 kg)  SpO2: 99%    Body mass index is 30.86 kg/(m^2).  Physical Exam  Constitutional: Patient appears well-developed and well-nourished. Obese  No distress.  HEENT: head atraumatic, normocephalic, pupils equal and reactive to light, ears normal TM, neck supple, throat  within normal limits Cardiovascular: Normal rate, regular rhythm and normal heart sounds.  No murmur heard. No BLE edema. Pulmonary/Chest: Effort normal and breath sounds normal. No respiratory distress. Dry/barky cough  Abdominal: Soft.  There is no tenderness. Psychiatric: Patient has a normal mood and affect. behavior is normal. Judgment and thought content normal.  Recent Results (from the past 2160 hour(s))  CBC     Status: None   Collection Time: 10/09/14  4:04 PM  Result Value Ref Range   WBC 7.7 3.4 - 10.8 x10E3/uL   RBC 4.34 3.77 - 5.28 x10E6/uL   Hemoglobin 13.2 11.1 - 15.9 g/dL   Hematocrit 39.9 34.0 - 46.6 %   MCV 92 79 - 97 fL   MCH 30.4 26.6 - 33.0 pg   MCHC 33.1 31.5 - 35.7 g/dL   RDW 13.0 12.3 - 15.4 %   Platelets 333 150 - 379 x10E3/uL  TSH     Status: None   Collection Time: 10/09/14  4:04 PM  Result Value Ref Range   TSH 2.480 0.450 - 4.500 uIU/mL  Antinuclear Antib (ANA)     Status: None   Collection Time: 10/09/14  4:04 PM  Result Value Ref Range   Anit Nuclear Antibody(ANA) Negative Negative  Comp Met (CMET)     Status: None   Collection Time: 10/09/14  4:04 PM  Result Value Ref Range   Glucose 80 65 - 99 mg/dL   BUN 10 6 - 20 mg/dL   Creatinine, Ser 0.61 0.57 - 1.00 mg/dL   GFR calc non Af Amer 126 >59 mL/min/1.73   GFR calc Af Amer 145 >59 mL/min/1.73   BUN/Creatinine Ratio 16 8 - 20   Sodium 142 134 - 144 mmol/L   Potassium 3.8 3.5 - 5.2 mmol/L   Chloride 104 97 - 108 mmol/L   CO2 21 18 - 29 mmol/L   Calcium 9.6 8.7 - 10.2 mg/dL   Total Protein 6.7 6.0 - 8.5 g/dL   Albumin 4.2 3.5 - 5.5 g/dL   Globulin, Total 2.5 1.5 - 4.5 g/dL   Albumin/Globulin Ratio 1.7 1.1 - 2.5   Bilirubin Total <0.2 0.0 - 1.2 mg/dL   Alkaline Phosphatase 80 39 - 117 IU/L   AST 11 0 - 40 IU/L   ALT 11 0 - 32 IU/L  HIV antibody (with reflex)     Status: None   Collection Time: 10/09/14  4:04 PM  Result Value Ref Range   HIV Screen 4th Generation wRfx Non Reactive Non  Reactive      PHQ2/9: Depression screen [facility] 2/9 12/19/2014 10/06/2014  Decreased Interest 0 0  Down, Depressed, Hopeless 0 0  PHQ - 2 Score 0 0    Fall Risk: Fall Risk  12/19/2014 10/06/2014  Falls in the past year? No No  Assessment & Plan  1. Cough Tdap is up to date. Discussed differential diagnosis: post-bronchial cough, bronchitis, pertussis and TB. We will start antibiotics, prednisone and cough suppressant, if no improvement in 5 days she will call back for PPD, CXR and CBC, pertussis titer - azithromycin (ZITHROMAX) 250 MG tablet; Take 2 tablets (500 mg total) by mouth once. And one daily ( 250 ) for the following 4 days  Dispense: 6 each; Refill: 0 - predniSONE (DELTASONE) 20 MG tablet; Take 1 tablet (20 mg total) by mouth daily with breakfast.  Dispense: 10 tablet; Refill: 0 - chlorpheniramine-HYDROcodone (TUSSIONEX PENNKINETIC ER) 10-8 MG/5ML SUER; Take 5 mLs by mouth every 12 (twelve) hours as needed for cough.  Dispense: 140 mL; Refill: 0 - Fluticasone-Salmeterol (ADVAIR DISKUS) 250-50 MCG/DOSE AEPB; Inhale 1 puff into the lungs 2 (two) times daily.  Dispense: 1 each; Refill: 0

## 2014-12-20 ENCOUNTER — Encounter: Payer: Self-pay | Admitting: Family Medicine

## 2014-12-21 ENCOUNTER — Ambulatory Visit: Payer: 59 | Admitting: Family Medicine

## 2015-01-01 ENCOUNTER — Encounter: Payer: Self-pay | Admitting: Family Medicine

## 2015-01-05 ENCOUNTER — Encounter: Payer: Self-pay | Admitting: Family Medicine

## 2015-01-05 ENCOUNTER — Ambulatory Visit (INDEPENDENT_AMBULATORY_CARE_PROVIDER_SITE_OTHER): Payer: 59 | Admitting: Family Medicine

## 2015-01-05 VITALS — BP 104/68 | HR 104 | Temp 98.8°F | Resp 16 | Ht 63.0 in | Wt 177.6 lb

## 2015-01-05 DIAGNOSIS — E669 Obesity, unspecified: Secondary | ICD-10-CM | POA: Diagnosis not present

## 2015-01-05 DIAGNOSIS — F411 Generalized anxiety disorder: Secondary | ICD-10-CM | POA: Diagnosis not present

## 2015-01-05 DIAGNOSIS — Z23 Encounter for immunization: Secondary | ICD-10-CM | POA: Diagnosis not present

## 2015-01-05 MED ORDER — CITALOPRAM HYDROBROMIDE 20 MG PO TABS: 20.0000 mg | ORAL_TABLET | Freq: Every day | ORAL | Status: DC

## 2015-01-05 NOTE — Progress Notes (Signed)
Name: Patricia Horn   MRN: [id number]    DOB: 25-Nov-1990   Date:01/05/2015       Progress Note  Subjective  Chief Complaint  Chief Complaint  Patient presents with  . Medication Refill    3 month F/U  . Anxiety    Well Controlled    HPI  Anxiety: she is still has a lot on her plate. 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 She denies panic attacks, she states occasionally has difficulty staying asleep.   Obesity: she just started to prepared her meals one week in advance. Working out three days weekly for at least 30 minutes. Drinking water or Crystal light only.   AR: having some nasal congestion, but cough from recent bronchitis has resolved. Rhinorrhea intermittently. Also has intermittent hoarseness.   Patient Active Problem List   Diagnosis Date Noted  . Umbilical cyst 93/81/8299  . Perennial allergic rhinitis 11/16/2014  . Anxiety, generalized 10/06/2014  . ANA positive 10/06/2014  . Ache in joint 10/06/2014  . History of uterine fibroid 10/06/2014    Past Surgical History  Procedure Laterality Date  . Myomectomy  2015  . Hernia repair  3716    umbilical     Family History  Problem Relation Age of Onset  . Hypertension Mother   . Scleroderma Mother   . Pulmonary fibrosis Mother   . Asthma Mother     Social History   Social History  . Marital Status: Single    Spouse Name: N/A  . Number of Children: N/A  . Years of Education: N/A   Occupational History  . Not on file.   Social History Main Topics  . Smoking status: Never Smoker   . Smokeless tobacco: Never Used  . Alcohol Use: 0.6 oz/week    1 Standard drinks or equivalent per week     Comment: ocassional  . Drug Use: No  . Sexual Activity:    Partners: Male    Birth Control/ Protection:  Injection   Other Topics Concern  . Not on file   Social History Narrative     Current outpatient prescriptions:  .  citalopram (CELEXA) 20 MG tablet, Take 20 tablets by mouth daily., Disp: , Rfl:  .  fluticasone (FLONASE) 50 MCG/ACT nasal spray, Place 2 sprays into both nostrils daily., Disp: 16 g, Rfl: 5 .  Fluticasone-Salmeterol (ADVAIR DISKUS) 250-50 MCG/DOSE AEPB, Inhale 1 puff into the lungs 2 (two) times daily., Disp: 1 each, Rfl: 0 .  loratadine (CLARITIN) 10 MG tablet, Take 1 tablet (10 mg total) by mouth daily., Disp: 30 tablet, Rfl: 5 .  medroxyPROGESTERone (DEPO-PROVERA) 150 MG/ML injection, Inject into the muscle every 3 (three) months., Disp: , Rfl:   No Known Allergies   ROS  Constitutional: Negative for fever or weight change.  Respiratory: Negative for cough and shortness of breath.   Cardiovascular: Negative for chest pain or palpitations.  Gastrointestinal: Negative for abdominal pain, no bowel changes.  Musculoskeletal: Negative for gait problem or joint swelling.  Skin: Negative for rash.  Neurological: Negative for dizziness or headache.  No other specific complaints in a complete review of systems (except as listed in HPI above).  Objective  Filed Vitals:   01/05/15 1408  BP: 104/68  Pulse: 104  Temp:  98.8 F (37.1 C)  TempSrc: Oral  Resp: 16  Height: '5\' 3"'  (1.6 m)  Weight: 177 lb 9.6 oz (80.559 kg)  SpO2: 96%    Body mass index is 31.47 kg/(m^2).  Physical Exam  Constitutional: Patient appears well-developed and well-nourished. Obese  No distress.  HEENT: head atraumatic, normocephalic, pupils equal and reactive to light,  neck supple, throat within normal limits Cardiovascular: Normal rate, regular rhythm and normal heart sounds.  No murmur heard. No BLE edema. Pulmonary/Chest: Effort normal and breath sounds normal. No respiratory distress. Abdominal: Soft.  There is no tenderness. Psychiatric: Patient has a normal mood and affect.  behavior is normal. Judgment and thought content normal.  Recent Results (from the past 2160 hour(s))  CBC     Status: None   Collection Time: 10/09/14  4:04 PM  Result Value Ref Range   WBC 7.7 3.4 - 10.8 x10E3/uL   RBC 4.34 3.77 - 5.28 x10E6/uL   Hemoglobin 13.2 11.1 - 15.9 g/dL   Hematocrit 39.9 34.0 - 46.6 %   MCV 92 79 - 97 fL   MCH 30.4 26.6 - 33.0 pg   MCHC 33.1 31.5 - 35.7 g/dL   RDW 13.0 12.3 - 15.4 %   Platelets 333 150 - 379 x10E3/uL  TSH     Status: None   Collection Time: 10/09/14  4:04 PM  Result Value Ref Range   TSH 2.480 0.450 - 4.500 uIU/mL  Antinuclear Antib (ANA)     Status: None   Collection Time: 10/09/14  4:04 PM  Result Value Ref Range   Anit Nuclear Antibody(ANA) Negative Negative  Comp Met (CMET)     Status: None   Collection Time: 10/09/14  4:04 PM  Result Value Ref Range   Glucose 80 65 - 99 mg/dL   BUN 10 6 - 20 mg/dL   Creatinine, Ser 0.61 0.57 - 1.00 mg/dL   GFR calc non Af Amer 126 >59 mL/min/1.73   GFR calc Af Amer 145 >59 mL/min/1.73   BUN/Creatinine Ratio 16 8 - 20   Sodium 142 134 - 144 mmol/L   Potassium 3.8 3.5 - 5.2 mmol/L   Chloride 104 97 - 108 mmol/L   CO2 21 18 - 29 mmol/L   Calcium 9.6 8.7 - 10.2 mg/dL   Total Protein 6.7 6.0 - 8.5 g/dL   Albumin 4.2 3.5 - 5.5 g/dL   Globulin, Total 2.5 1.5 - 4.5 g/dL   Albumin/Globulin Ratio 1.7 1.1 - 2.5   Bilirubin Total <0.2 0.0 - 1.2 mg/dL   Alkaline Phosphatase 80 39 - 117 IU/L   AST 11 0 - 40 IU/L   ALT 11 0 - 32 IU/L  HIV antibody (with reflex)     Status: None   Collection Time: 10/09/14  4:04 PM  Result Value Ref Range   HIV Screen 4th Generation wRfx Non Reactive Non Reactive     PHQ2/9: Depression screen [facility] 2/9 12/19/2014 10/06/2014  Decreased Interest 0 0  Down, Depressed, Hopeless 0 0  PHQ - 2 Score 0 0     Fall Risk: Fall Risk  12/19/2014 10/06/2014  Falls in the past year? No No     Assessment & Plan  1. Anxiety, generalized Doing well  - citalopram (CELEXA)  20 MG tablet; Take 1 tablet (20 mg total) by mouth daily.  Dispense: 90 tablet; Refill: 1  2. Needs flu shot  - Flu Vaccine QUAD 36+ mos PF IM (Fluarix & Fluzone Quad PF)  3. Obese Discussed with the patient the risk posed by an increased BMI. Discussed importance of portion control, calorie counting and at least 150 minutes of physical activity weekly. Avoid sweet beverages and drink more water. Eat at least 6 servings of fruit and vegetables daily

## 2015-01-11 ENCOUNTER — Encounter: Payer: Self-pay | Admitting: General Surgery

## 2015-01-17 ENCOUNTER — Encounter: Payer: Self-pay | Admitting: General Surgery

## 2015-02-05 ENCOUNTER — Ambulatory Visit (INDEPENDENT_AMBULATORY_CARE_PROVIDER_SITE_OTHER): Payer: 59

## 2015-02-05 DIAGNOSIS — Z23 Encounter for immunization: Secondary | ICD-10-CM

## 2015-02-09 ENCOUNTER — Ambulatory Visit (INDEPENDENT_AMBULATORY_CARE_PROVIDER_SITE_OTHER): Payer: 59

## 2015-02-09 VITALS — BP 112/76 | HR 108 | Ht 62.0 in | Wt 185.6 lb

## 2015-02-09 DIAGNOSIS — Z3042 Encounter for surveillance of injectable contraceptive: Secondary | ICD-10-CM | POA: Diagnosis not present

## 2015-02-09 MED ORDER — MEDROXYPROGESTERONE ACETATE 150 MG/ML IM SUSP
150.0000 mg | Freq: Once | INTRAMUSCULAR | Status: AC
  Administered 2015-02-09: 150 mg via INTRAMUSCULAR

## 2015-02-09 NOTE — Progress Notes (Signed)
Patient ID: Patricia Horn, female   DOB: December 17, 1990, 24 y.o.   MRN: [id number] Pt presents for depo inj. No s/e.

## 2015-04-06 ENCOUNTER — Ambulatory Visit: Payer: 59 | Admitting: Family Medicine

## 2015-04-06 ENCOUNTER — Ambulatory Visit (INDEPENDENT_AMBULATORY_CARE_PROVIDER_SITE_OTHER): Payer: 59 | Admitting: Family Medicine

## 2015-04-06 ENCOUNTER — Encounter: Payer: Self-pay | Admitting: Family Medicine

## 2015-04-06 VITALS — BP 120/82 | HR 102 | Temp 99.4°F | Resp 14 | Wt 187.6 lb

## 2015-04-06 DIAGNOSIS — E669 Obesity, unspecified: Secondary | ICD-10-CM | POA: Diagnosis not present

## 2015-04-06 DIAGNOSIS — R635 Abnormal weight gain: Secondary | ICD-10-CM | POA: Diagnosis not present

## 2015-04-06 DIAGNOSIS — N945 Secondary dysmenorrhea: Secondary | ICD-10-CM | POA: Diagnosis not present

## 2015-04-06 DIAGNOSIS — F411 Generalized anxiety disorder: Secondary | ICD-10-CM

## 2015-04-06 MED ORDER — DULOXETINE HCL 30 MG PO CPEP: 30.0000 mg | ORAL_CAPSULE | Freq: Every day | ORAL | Status: DC

## 2015-04-06 NOTE — Progress Notes (Signed)
Name: Patricia Horn   MRN: [id number]    DOB: Feb 03, 1991   Date:04/06/2015       Progress Note  Subjective  Chief Complaint  Chief Complaint  Patient presents with  . Weight Gain    abnormal has gained 18pds since june  . Fatigue    HPI  Obesity and weight gain: she has noticed significant weight gain and fatigue over the past year.  Symptoms worse over the past 6 months. Last TSH was done in June and it was normal. She has been on Depo for years, but Citalopram only for the past year. She also states she stopped going to the gym because she was finishing her master's degree and did not have time.  She eats yogurt for breakfast, has a salad and [place] for lunch but with a lot of dressing. Dinner on the go but sometimes at home. She usually drinking unsweetened beverages. Sparkling water or water. She avoid sweets.   Dysmenorrhea: had myomectomy done, but is on Depo, afraid of having severe cramps again, explained that Depo can cause weight gain, we will refer her back to Dr. Enzo Bi  GAD: it was severe last year, with irritability, chest tightness, problems sleeping. She was started on Celexa and symptoms improved, however also has gained a lot of weight which is making her upset.     Patient Active Problem List   Diagnosis Date Noted  . 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 Obese 01/05/2015  . Umbilical cyst [id number]  . Perennial allergic rhinitis 11/16/2014  . Anxiety, generalized 10/06/2014  . ANA positive 10/06/2014  . Ache in joint 10/06/2014  . History of uterine fibroid 10/06/2014    Past Surgical History  Procedure Laterality Date  . Myomectomy  2015  . Hernia repair  [id number]    umbilical     Family History  Problem Relation Age of Onset  . Hypertension Mother   . Scleroderma Mother   . Pulmonary fibrosis Mother   . Asthma Mother     Social History   Social History  . Marital Status: Single    Spouse Name: N/A  . Number of Children: N/A  . Years of  Education: N/A   Occupational History  . Not on file.   Social History Main Topics  . Smoking status: Never Smoker   . Smokeless tobacco: Never Used  . Alcohol Use: 0.6 oz/week    1 Standard drinks or equivalent per week     Comment: ocassional  . Drug Use: No  . Sexual Activity:    Partners: Male    Birth Control/ Protection: Injection   Other Topics Concern  . Not on file   Social History Narrative     Current outpatient prescriptions:  .  DULoxetine (CYMBALTA) 30 MG capsule, Take 1-2 capsules (30-60 mg total) by mouth daily. First week only one capsule after that 2 daily, Disp: 60 capsule, Rfl: 0 .  fluticasone (FLONASE) 50 MCG/ACT nasal spray, Place 2 sprays into both nostrils daily., Disp: 16 g, Rfl: 5 .  loratadine (CLARITIN) 10 MG tablet, Take 1 tablet (10 mg total) by mouth daily., Disp: 30 tablet, Rfl: 5 .  medroxyPROGESTERone (DEPO-PROVERA) 150 MG/ML injection, Inject into the muscle every 3 (three) months., Disp: , Rfl:   No Known Allergies   ROS  Constitutional: Negative for fever , positive for weight change.  Respiratory: Negative for cough and shortness of breath.   Cardiovascular: Negative for chest pain or palpitations.  Gastrointestinal: Negative for  abdominal pain, no bowel changes.  Musculoskeletal: Negative for gait problem or joint swelling.  Skin: Negative for rash.  Neurological: Negative for dizziness or headache.  No other specific complaints in a complete review of systems (except as listed in HPI above).  Objective  Filed Vitals:   04/06/15 1225  BP: 120/82  Pulse: 102  Temp: 99.4 F (37.4 C)  TempSrc: Oral  Resp: 14  Weight: 187 lb 9.6 oz (85.095 kg)  SpO2: 97%    Body mass index is 34.3 kg/(m^2).  Physical Exam  Constitutional: Patient appears well-developed and well-nourished. Obese No distress.  HEENT: head atraumatic, normocephalic, pupils equal and reactive to light, neck supple, throat within normal  limits Cardiovascular: Normal rate, regular rhythm and normal heart sounds.  No murmur heard. No BLE edema. Pulmonary/Chest: Effort normal and breath sounds normal. No respiratory distress. Abdominal: Soft.  There is no tenderness. Psychiatric: Patient has a normal mood and affect. behavior is normal. Judgment and thought content normal.   PHQ2/9: Depression screen [facility] 2/9 04/06/2015 12/19/2014 10/06/2014  Decreased Interest 0 0 0  Down, Depressed, Hopeless 0 0 0  PHQ - 2 Score 0 0 0    Fall Risk: Fall Risk  04/06/2015 12/19/2014 10/06/2014  Falls in the past year? No No No    Functional Status Survey: Is the patient deaf or have difficulty hearing?: No Does the patient have difficulty seeing, even when wearing glasses/contacts?: No Does the patient have difficulty concentrating, remembering, or making decisions?: No Does the patient have difficulty walking or climbing stairs?: No Does the patient have difficulty dressing or bathing?: No Does the patient have difficulty doing errands alone such as visiting a doctor's office or shopping?: No    Assessment & Plan  1. Anxiety, generalized  Change medication because of weight gain  - DULoxetine (CYMBALTA) 30 MG capsule; Take 1-2 capsules (30-60 mg total) by mouth daily. First week only one capsule after that 2 daily  Dispense: 60 capsule; Refill: 0  2. Weight gain  Discussed changing from Depo to IUD and also from Citalopram to Duloxetine  3. Obese  Discussed with the patient the risk posed by an increased BMI. Discussed importance of portion control, calorie counting and at least 60 minutes of physical activity daily  Avoid sweet beverages and drink more water. Eat at least 6 servings of fruit and vegetables daily   4. Secondary dysmenorrhea  - Ambulatory referral to Obstetrics / Gynecology

## 2015-04-23 IMAGING — US TRANSABDOMINAL ULTRASOUND OF PELVIS
1 series · 13 of 25 positions shown · non-contrast
Comparison: none

REASON FOR EXAM: abnormal CT
COMMENTS:

[Series 1: transabdominal ultrasound of pelvis · 0.26mm/px · 13 of 81 slices shown]
[im 1/81]
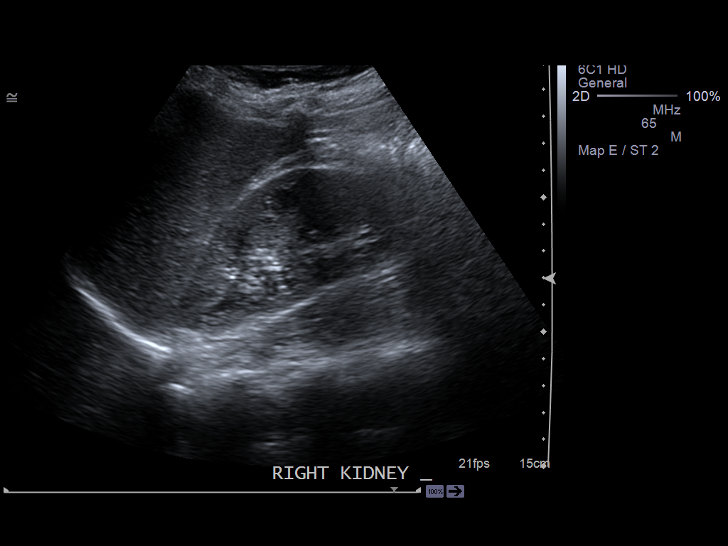
[im 7/81]
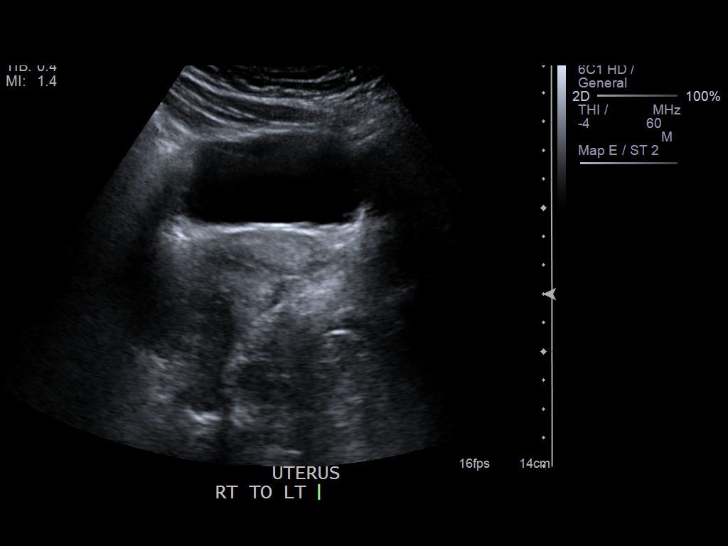
[im 14/81]
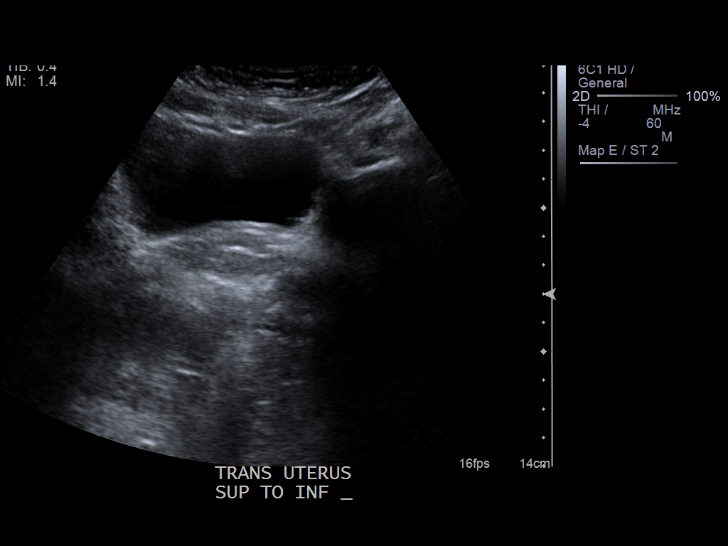
[im 21/81]
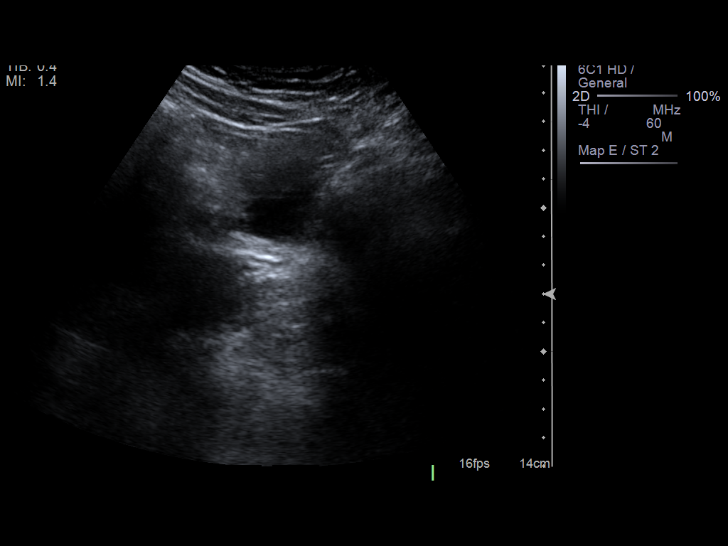
[im 27/81]
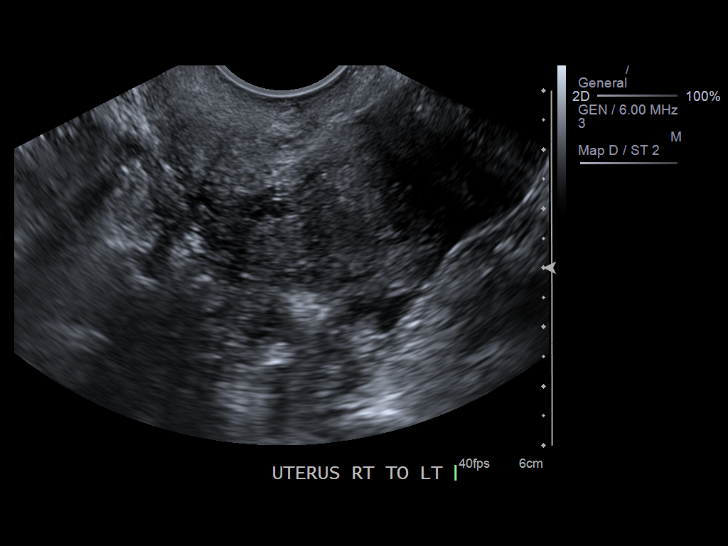
[im 34/81]
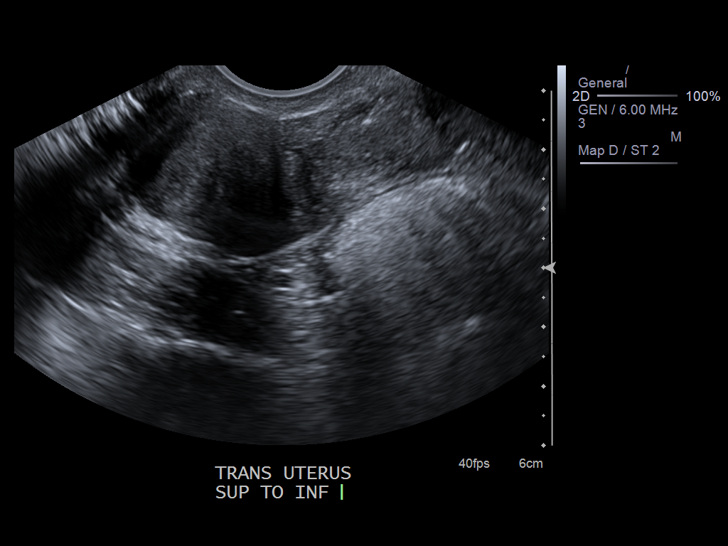
[im 41/81]
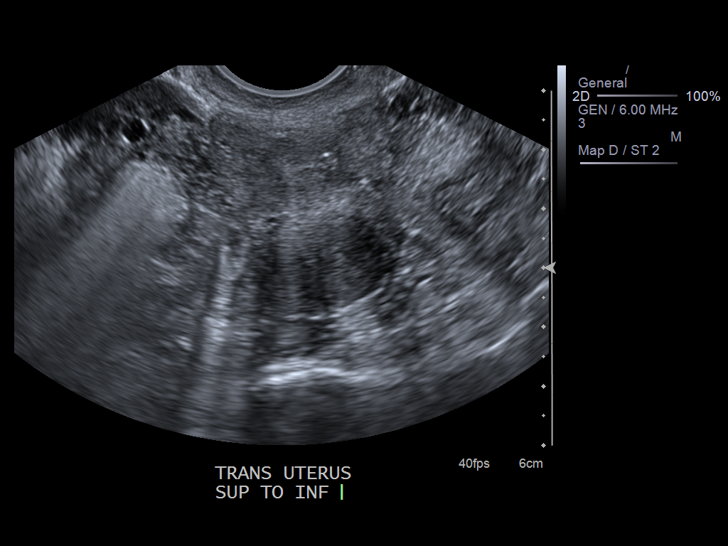
[im 47/81]
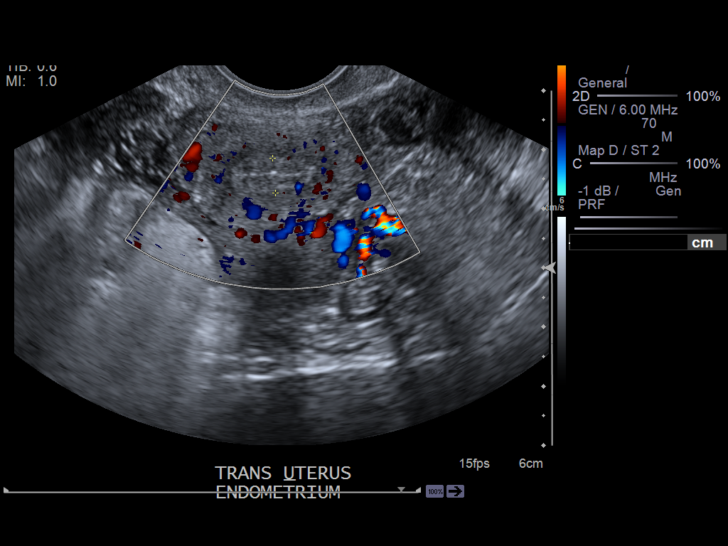
[im 54/81]
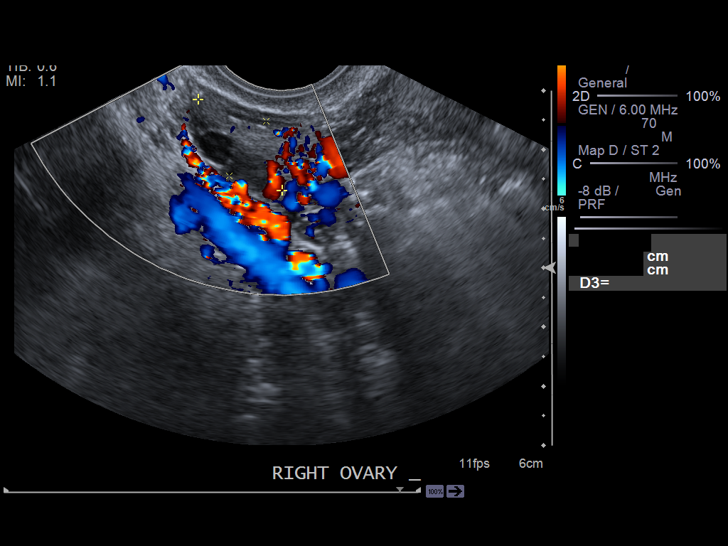
[im 61/81]
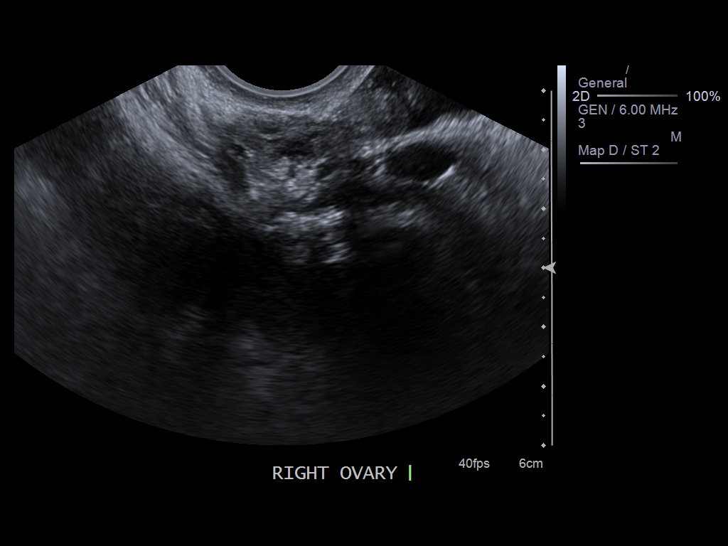
[im 67/81]
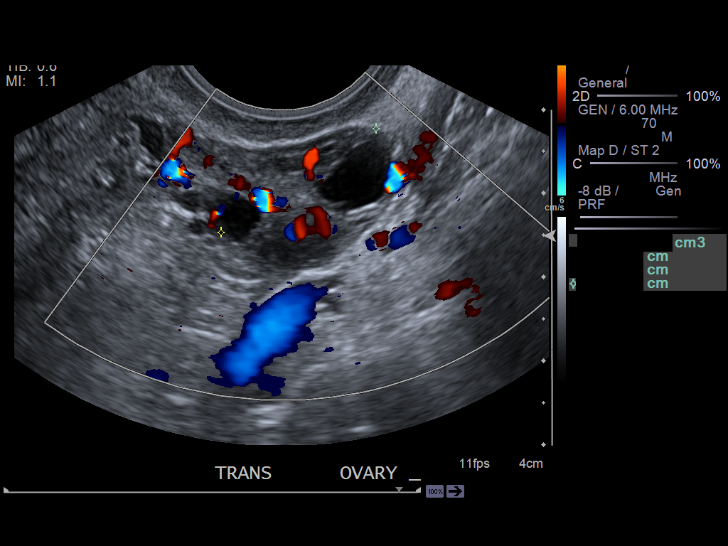
[im 74/81]
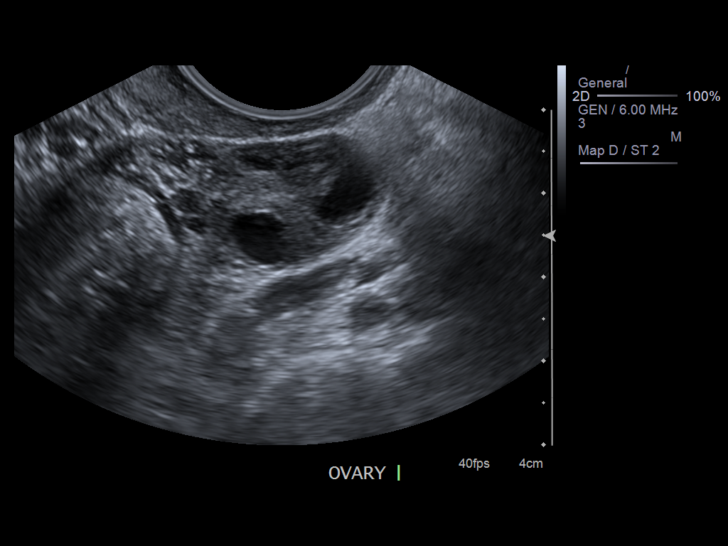
[im 81/81]
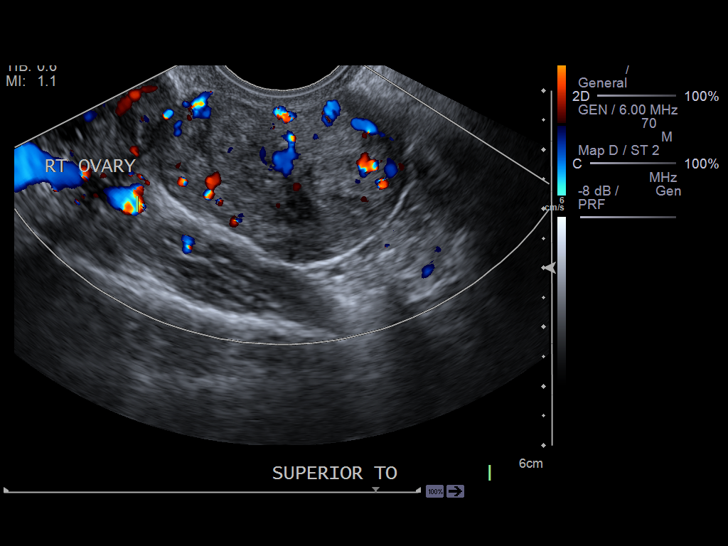

[13 of 25 positions shown; findings below may reference images not displayed]

PROCEDURE:     JIM - JIM PELVIS NON-OB W/TRANSVAGINAL  - December 23, 2012  [DATE]

RESULT:     Transabdominal and endovaginal pelvic ultrasound is correlated
with a CT scan dated 12/17/2012. The uterus measures 7.32 x 3.75 x 4.56 cm.
The endometrial stripe thickness is 5.9 mm. There is a fibroid in the
superior portion of the uterus measuring 1.97 x 1.36 x 2.00 cm. The right
ovary measures 2.09 x 1.12 x 1.70 cm. The left ovary measures 2.23 x 1.57 x
2.23 cm. The kidneys show no obstruction.

There is a solid appearing mass measuring 4.19 x 2.33 x 4.01 cm posterior to
the uterus. The uterus appears to be somewhat retroverted. On the CT scan
and on some of the ultrasound images the mass appears to abut the posterior
portion of the retroverted uterus. The enhancement pattern on CT with
different within the uterus but was heterogeneous. Correlation for pelvic
mass either arising from or adjacent to the uterus is suggested. This does
not appear to arise from the right ovary. Gynecologic followup is
recommended.
IMPRESSION: Pelvic mass near the retroverted uterus and rectum. The
etiology is uncertain. Gynecologic followup is recommended.

[REDACTED]

## 2015-04-27 ENCOUNTER — Ambulatory Visit (INDEPENDENT_AMBULATORY_CARE_PROVIDER_SITE_OTHER): Payer: 59 | Admitting: Obstetrics and Gynecology

## 2015-04-27 VITALS — BP 93/68 | HR 90 | Ht 63.0 in | Wt 187.1 lb

## 2015-04-27 DIAGNOSIS — Z3042 Encounter for surveillance of injectable contraceptive: Secondary | ICD-10-CM | POA: Diagnosis not present

## 2015-04-27 MED ORDER — MEDROXYPROGESTERONE ACETATE 150 MG/ML IM SUSP
150.0000 mg | Freq: Once | INTRAMUSCULAR | Status: AC
  Administered 2015-04-27: 150 mg via INTRAMUSCULAR

## 2015-04-27 NOTE — Patient Instructions (Signed)
Pt to return as scheduled.

## 2015-04-27 NOTE — Progress Notes (Signed)
Pt presents for Depo-Provera injection. Pt denies complaints or side effects. Pt does note that her PCP recommends that she think about a new form of contraception due to the length of time on Depo (74yrs). Pt states she is considering a Mirena, and will discuss it at her annual in February. Pt given 98mL injection of Depo-Provera in RT deltoid. Pt tolerated well. Pt to return for next depo within the window of March 10-24th. Pt scheduled accordingly.

## 2015-05-10 ENCOUNTER — Ambulatory Visit (INDEPENDENT_AMBULATORY_CARE_PROVIDER_SITE_OTHER): Payer: 59 | Admitting: Family Medicine

## 2015-05-10 ENCOUNTER — Encounter: Payer: Self-pay | Admitting: Family Medicine

## 2015-05-10 VITALS — BP 108/70 | HR 107 | Temp 99.0°F | Resp 18 | Wt 188.5 lb

## 2015-05-10 DIAGNOSIS — F411 Generalized anxiety disorder: Secondary | ICD-10-CM

## 2015-05-10 MED ORDER — DULOXETINE HCL 30 MG PO CPEP: 30.0000 mg | ORAL_CAPSULE | Freq: Every day | ORAL | Status: DC

## 2015-05-10 NOTE — Progress Notes (Signed)
Name: Patricia Horn   MRN: [id number]    DOB: 05-02-1991   Date:05/10/2015       Progress Note  Subjective  Chief Complaint  Chief Complaint  Patient presents with  . Anxiety    patient is here for her 66-month follow-up. patient was suppose to try the new med prescribed but her mother did not approve.    HPI  GAD: it was severe last year, with irritability, chest tightness, problems sleeping. She was started on Celexa and symptoms improved, however also has gained a lot of weight which is making her upset. We sent rx for Cymbalta on her last visit, but her mother did not want her to continue taking medication - so she tried to stop Celexa and her mother noticed that she was very edgy and now she agrees on letting her take the medication. Anarosa would like another refill to be sent to pharmacy so she can pick it up today   Patient Active Problem List   Diagnosis Date Noted  . 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 Obese 01/05/2015  . Umbilical cyst [id number]  . Perennial allergic rhinitis 11/16/2014  . Anxiety, generalized 10/06/2014  . ANA positive 10/06/2014  . History of uterine fibroid 10/06/2014    Past Surgical History  Procedure Laterality Date  . Myomectomy  2015  . Hernia repair  [id number]    umbilical     Family History  Problem Relation Age of Onset  . Hypertension Mother   . Scleroderma Mother   . Pulmonary fibrosis Mother   . Asthma Mother     Social History   Social History  . Marital Status: Single    Spouse Name: N/A  . Number of Children: N/A  . Years of Education: N/A   Occupational History  . Not on file.   Social History Main Topics  . Smoking status: Never Smoker   . Smokeless tobacco: Never Used  . Alcohol Use: 0.6 oz/week    1 Standard drinks or equivalent per week     Comment: ocassional  . Drug Use: No  . Sexual Activity:    Partners: Male    Birth Control/ Protection: Injection   Other Topics Concern  . Not on file   Social History  Narrative     Current outpatient prescriptions:  .  fluticasone (FLONASE) 50 MCG/ACT nasal spray, Place 2 sprays into both nostrils daily., Disp: 16 g, Rfl: 5 .  medroxyPROGESTERone (DEPO-PROVERA) 150 MG/ML injection, Inject into the muscle every 3 (three) months., Disp: , Rfl:  .  DULoxetine (CYMBALTA) 30 MG capsule, Take 1-2 capsules (30-60 mg total) by mouth daily. First week only one capsule after that 2 daily, Disp: 60 capsule, Rfl: 0 .  loratadine (CLARITIN) 10 MG tablet, Take 1 tablet (10 mg total) by mouth daily. (Patient not taking: Reported on 05/10/2015), Disp: 30 tablet, Rfl: 5  No Known Allergies   ROS  Ten systems reviewed and is negative except as mentioned in HPI   Objective  Filed Vitals:   05/10/15 1508  BP: 108/70  Pulse: 107  Temp: 99 F (37.2 C)  TempSrc: Oral  Resp: 18  Weight: 188 lb 8 oz (85.503 kg)  SpO2: 96%    Body mass index is 33.4 kg/(m^2).  Physical Exam  Constitutional: Patient appears well-developed and well-nourished. Obese  No distress.  HEENT: head atraumatic, normocephalic, pupils equal and reactive to light,  neck supple, throat within normal limits Cardiovascular: Normal rate, regular rhythm and normal  heart sounds.  No murmur heard. No BLE edema. Pulmonary/Chest: Effort normal and breath sounds normal. No respiratory distress. Abdominal: Soft.  There is no tenderness. Psychiatric: Patient has a normal mood and affect. behavior is normal. Judgment and thought content normal.  PHQ2/9: Depression screen [facility] 2/9 05/10/2015 04/06/2015 12/19/2014 10/06/2014  Decreased Interest 0 0 0 0  Down, Depressed, Hopeless 0 0 0 0  PHQ - 2 Score 0 0 0 0    Fall Risk: Fall Risk  05/10/2015 04/06/2015 12/19/2014 10/06/2014  Falls in the past year? No No No No    Functional Status Survey: Is the patient deaf or have difficulty hearing?: No Does the patient have difficulty seeing, even when wearing glasses/contacts?: No Does the patient have difficulty  concentrating, remembering, or making decisions?: No Does the patient have difficulty walking or climbing stairs?: No Does the patient have difficulty dressing or bathing?: No Does the patient have difficulty doing errands alone such as visiting a doctor's office or shopping?: No    Assessment & Plan  1. Anxiety, generalized  Discussed possible side effects gain  - DULoxetine (CYMBALTA) 30 MG capsule; Take 1-2 capsules (30-60 mg total) by mouth daily. First week only one capsule after that 2 daily  Dispense: 60 capsule; Refill: 0

## 2015-05-10 NOTE — Patient Instructions (Signed)
Take half of Citalopram and one capsule of Cymbalta ( Duloxetine ) 30 mg for the first week, after that stop Citalopram and take 2 caps of Duloxetine daily, call back when almost out and I will change to a 60 mg capsule.

## 2015-05-21 ENCOUNTER — Other Ambulatory Visit: Payer: Self-pay

## 2015-05-21 MED ORDER — DULOXETINE HCL 60 MG PO CPEP: 60.0000 mg | ORAL_CAPSULE | Freq: Every day | ORAL | Status: DC

## 2015-05-21 NOTE — Telephone Encounter (Signed)
[facility] faxed [place] a paper stating her Insurance will not cover Duloxetine 30 mg bid, but will cover 60 mg qd. Can you please switch? [facility] gave patient a 7 capsules for the 1 a day part until we can fixed her prescription.

## 2015-06-21 ENCOUNTER — Ambulatory Visit (INDEPENDENT_AMBULATORY_CARE_PROVIDER_SITE_OTHER): Payer: 59 | Admitting: Family Medicine

## 2015-06-21 ENCOUNTER — Encounter: Payer: Self-pay | Admitting: Family Medicine

## 2015-06-21 VITALS — BP 100/60 | HR 102 | Temp 99.4°F | Resp 14 | Ht 63.0 in | Wt 189.4 lb

## 2015-06-21 DIAGNOSIS — M25531 Pain in right wrist: Secondary | ICD-10-CM

## 2015-06-21 DIAGNOSIS — E669 Obesity, unspecified: Secondary | ICD-10-CM | POA: Diagnosis not present

## 2015-06-21 DIAGNOSIS — F411 Generalized anxiety disorder: Secondary | ICD-10-CM | POA: Diagnosis not present

## 2015-06-21 DIAGNOSIS — G4700 Insomnia, unspecified: Secondary | ICD-10-CM

## 2015-06-21 MED ORDER — ALPRAZOLAM 0.5 MG PO TABS: 0.5000 mg | ORAL_TABLET | Freq: Every evening | ORAL | Status: DC | PRN

## 2015-06-21 NOTE — Progress Notes (Signed)
Name: Patricia Horn   MRN: [id number]    DOB: January 14, 1991   Date:06/21/2015       Progress Note  Subjective  Chief Complaint  Chief Complaint  Patient presents with  . Anxiety    patient is here for her 6-week f/u   . Wrist Pain    patient has had right wrist pain for about 2 wks since workout. patient has tried ice, ace bandage & otc advil    HPI    Obesity : she has noticed significant weight gain and fatigue over the past 18 months. Symptoms worse over the past 9 months. Last TSH was done in June and it was normal. She has been on Depo for years, but Citalopram only for the past year, also coincided with time that she started [place] school and was less physically active. She is off Citalopram now. Going to the gym three times weekly, eating healthy, but still not losing weight. She will see gyn next week to discuss IUD and stop Depo.   Dysmenorrhea: had myomectomy done, but is on Depo, afraid of having severe cramps again, explained that Depo can cause weight gain,she has a follow up with Dr. Enzo Bi  GAD: it was severe last year, with irritability, chest tightness, problems sleeping. She was started on Celexa and symptoms improved, however also has gained a lot of weight so in January we stopped Citalopram and she is now on Cymbalta. She states symptoms of anxiety are controlled during the day, but still wakes through the night and feels tired during the day.  Wrist pain: she noticed pain on right medial wrist over the past two weeks, she was seen by Ortho in the past and has some braces at home. She types on her phone a lot.   Patient Active Problem List   Diagnosis Date Noted  . 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 Obese 01/05/2015  . Umbilical cyst Q000111Q  . Perennial allergic rhinitis 11/16/2014  . Anxiety, generalized 10/06/2014  . ANA positive 10/06/2014  . History of uterine fibroid 10/06/2014    Past Surgical History  Procedure Laterality Date  . Myomectomy   2015  . Hernia repair  [id number]    umbilical     Family History  Problem Relation Age of Onset  . Hypertension Mother   . Scleroderma Mother   . Pulmonary fibrosis Mother   . Asthma Mother     Social History   Social History  . Marital Status: Single    Spouse Name: N/A  . Number of Children: N/A  . Years of Education: N/A   Occupational History  . Not on file.   Social History Main Topics  . Smoking status: Never Smoker   . Smokeless tobacco: Never Used  . Alcohol Use: 0.6 oz/week    1 Standard drinks or equivalent per week     Comment: ocassional  . Drug Use: No  . Sexual Activity:    Partners: Male    Birth Control/ Protection: Injection   Other Topics Concern  . Not on file   Social History Narrative     Current outpatient prescriptions:  .  DULoxetine (CYMBALTA) 60 MG capsule, Take 1 capsule (60 mg total) by mouth daily., Disp: 90 capsule, Rfl: 0 .  fluticasone (FLONASE) 50 MCG/ACT nasal spray, Place 2 sprays into both nostrils daily., Disp: 16 g, Rfl: 5 .  loratadine (CLARITIN) 10 MG tablet, Take 1 tablet (10 mg total) by mouth daily., Disp: 30 tablet, Rfl: 5 .  medroxyPROGESTERone (DEPO-PROVERA) 150 MG/ML injection, Inject into the muscle every 3 (three) months., Disp: , Rfl:  .  ALPRAZolam (XANAX) 0.5 MG tablet, Take 1 tablet (0.5 mg total) by mouth at bedtime as needed for anxiety. And sleep, Disp: 30 tablet, Rfl: 0  No Known Allergies   ROS  Constitutional: Negative for fever or weight change.  Respiratory: Negative for cough and shortness of breath.   Cardiovascular: Negative for chest pain or palpitations.  Gastrointestinal: Negative for abdominal pain, no bowel changes.  Musculoskeletal: Negative for gait problem or joint swelling.  Skin: Negative for rash.  Neurological: Negative for dizziness or headache.  No other specific complaints in a complete review of systems (except as listed in HPI above).  Objective  Filed Vitals:   06/21/15 1503   BP: 100/60  Pulse: 102  Temp: 99.4 F (37.4 C)  TempSrc: Oral  Resp: 14  Height: 5\' 3"  (1.6 m)  Weight: 189 lb 6.4 oz (85.911 kg)  SpO2: 98%    Body mass index is 33.56 kg/(m^2).  Physical Exam   Constitutional: Patient appears well-developed and well-nourished. Obese  No distress.  HEENT: head atraumatic, normocephalic, pupils equal and reactive to light, neck supple, throat within normal limits Cardiovascular: Normal rate, regular rhythm and normal heart sounds.  No murmur heard. No BLE edema. Pulmonary/Chest: Effort normal and breath sounds normal. No respiratory distress. Abdominal: Soft.  There is no tenderness. Psychiatric: Patient has a normal mood and affect. behavior is normal. Judgment and thought content normal. Muscular Skeletal: pain during flexion of right thumb and lateral bending of wrist , no swelling today  PHQ2/9: Depression screen [facility] 2/9 06/21/2015 05/10/2015 04/06/2015 12/19/2014 10/06/2014  Decreased Interest 0 0 0 0 0  Down, Depressed, Hopeless 0 0 0 0 0  PHQ - 2 Score 0 0 0 0 0     Fall Risk: Fall Risk  06/21/2015 05/10/2015 04/06/2015 12/19/2014 10/06/2014  Falls in the past year? No No No No No     Functional Status Survey: Is the patient deaf or have difficulty hearing?: No Does the patient have difficulty seeing, even when wearing glasses/contacts?: No Does the patient have difficulty concentrating, remembering, or making decisions?: No Does the patient have difficulty walking or climbing stairs?: No Does the patient have difficulty dressing or bathing?: No Does the patient have difficulty doing errands alone such as visiting a doctor's office or shopping?: No    Assessment & Plan  1. Anxiety, generalized  Continue Cymbalta during the day, add Xanax at night - discussed possible side effects - ALPRAZolam (XANAX) 0.5 MG tablet; Take 1 tablet (0.5 mg total) by mouth at bedtime as needed for anxiety. And sleep  Dispense: 30 tablet; Refill: 0  2.  Insomnia  - ALPRAZolam (XANAX) 0.5 MG tablet; Take 1 tablet (0.5 mg total) by mouth at bedtime as needed for anxiety. And sleep  Dispense: 30 tablet; Refill: 0  3. Obese  Discussed with the patient the risk posed by an increased BMI. Discussed importance of portion control, calorie counting and at least 150 minutes of physical activity weekly. Avoid sweet beverages and drink more water. Eat at least 6 servings of fruit and vegetables daily   4. Wrist pain, right  Likely DeQuervain's tenosynovitis, advised to resume thumb spica brace

## 2015-06-26 ENCOUNTER — Ambulatory Visit (INDEPENDENT_AMBULATORY_CARE_PROVIDER_SITE_OTHER): Payer: 59 | Admitting: Obstetrics and Gynecology

## 2015-06-26 ENCOUNTER — Encounter: Payer: Self-pay | Admitting: Obstetrics and Gynecology

## 2015-06-26 VITALS — BP 139/73 | HR 89 | Ht 62.0 in | Wt 187.3 lb

## 2015-06-26 DIAGNOSIS — R638 Other symptoms and signs concerning food and fluid intake: Secondary | ICD-10-CM | POA: Diagnosis not present

## 2015-06-26 DIAGNOSIS — D259 Leiomyoma of uterus, unspecified: Secondary | ICD-10-CM | POA: Diagnosis not present

## 2015-06-26 DIAGNOSIS — N946 Dysmenorrhea, unspecified: Secondary | ICD-10-CM

## 2015-06-26 NOTE — Patient Instructions (Signed)
1. Ultrasound is scheduled to assess uterine fibroids 2. TSH is to be obtained to assess for hypothyroidism 3. Return in 1-2 weeks for Mirena IUD insertion

## 2015-06-26 NOTE — Progress Notes (Signed)
Chief complaint: 1. Chronic pelvic pain/severe dysmenorrhea 2. History of uterine fibroids, status post myomectomies laparoscopically 3. Weight gain on Depo-Provera  Patient is a 25 year old African-American female, para 0, on Depo-Provera for approximately 6 years, with total weight gain greater than 27 pounds while on Depo-Provera, presents now for assessment regarding ongoing medical management of severe dysmenorrhea.  Chart review demonstrated the patient to have no evidence of biopsy verified endometriosis at the time of laparoscopy. However, she has been treated successfully with Depo-Provera with resolution of severe dysmenorrhea. However weight gain issues as noted. Previous use of birth control pills were suboptimally successful in controlling dysmenorrhea.  Patient has had thyroid studies drawn approximately year ago for assessment of her weight gain; studies were normal. 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 It demonstrates less than 5000 steps daily per her history.  Past medical history, past surgical history, problem list, medications, and allergies are reviewed  Review of systems: Per history of present illness  OBJECTIVE: BP 139/73 mmHg  Pulse 89  Ht 5\' 2"  (1.575 m)  Wt 187 lb 4.8 oz (84.959 kg)  BMI 34.25 kg/m2 Pleasant well-appearing female in no acute distress. She is alert and oriented. Neck: No thyromegaly Abdomen: Soft, nontender, without organomegaly Pelvic exam: External genitalia normal BUS-normal Vagina-good support; normal estrogen effect Cervix-no lesions; no cervical motion tenderness, normal Uterus-mid plane, top normal size, mobile, nontender Adnexa-nonpalpable nontender Rectovaginal-normal external exam  ASSESSMENT: 1. Long history of severe dysmenorrhea, suboptimally controlled with birth control pills, better controlled with  Depo-Provera 2. Excessive weight gain on Depo-Provera through the years 3. Sedentary lifestyle recently appears to have contributed partially to weight gain 4. No recent assessment of thyroid status 5. History of fibroids, status post laparoscopic myomectomies  PLAN: 1. Pelvic ultrasound to assess for uterine fibroids 2. Return in 1 week for Mirena IUD insertion barring contraindications from ultrasound 3. TSH  A total of 15 minutes were spent face-to-face with the patient during this encounter and over half of that time dealt with counseling and coordination of care.  Brayton Mars, MD  Note: This dictation was prepared with Dragon dictation along with smaller phrase technology. Any transcriptional errors that result from this process are unintentional.

## 2015-06-29 ENCOUNTER — Ambulatory Visit (INDEPENDENT_AMBULATORY_CARE_PROVIDER_SITE_OTHER): Payer: 59

## 2015-06-29 DIAGNOSIS — D259 Leiomyoma of uterus, unspecified: Secondary | ICD-10-CM

## 2015-07-05 ENCOUNTER — Ambulatory Visit: Payer: 59 | Admitting: Family Medicine

## 2015-07-05 ENCOUNTER — Encounter: Payer: Self-pay | Admitting: Obstetrics and Gynecology

## 2015-07-05 ENCOUNTER — Encounter (INDEPENDENT_AMBULATORY_CARE_PROVIDER_SITE_OTHER): Payer: 59 | Admitting: Obstetrics and Gynecology

## 2015-07-05 VITALS — BP 112/76 | HR 120 | Resp 16 | Ht 62.0 in | Wt 189.0 lb

## 2015-07-05 DIAGNOSIS — Z3040 Encounter for surveillance of contraceptives, unspecified: Secondary | ICD-10-CM

## 2015-07-05 LAB — POCT URINE PREGNANCY: Preg Test, Ur: NEGATIVE

## 2015-07-09 NOTE — Progress Notes (Signed)
This encounter was created in error - please disregard. This encounter was created in error - please disregard. This encounter was created in error - please disregard. 

## 2015-07-11 ENCOUNTER — Ambulatory Visit (INDEPENDENT_AMBULATORY_CARE_PROVIDER_SITE_OTHER): Payer: 59 | Admitting: Obstetrics and Gynecology

## 2015-07-11 ENCOUNTER — Encounter: Payer: Self-pay | Admitting: Obstetrics and Gynecology

## 2015-07-11 VITALS — BP 104/72 | HR 98 | Wt 189.0 lb

## 2015-07-11 DIAGNOSIS — Z30014 Encounter for initial prescription of intrauterine contraceptive device: Secondary | ICD-10-CM | POA: Diagnosis not present

## 2015-07-11 DIAGNOSIS — N946 Dysmenorrhea, unspecified: Secondary | ICD-10-CM

## 2015-07-11 LAB — POCT URINE PREGNANCY: PREG TEST UR: NEGATIVE

## 2015-07-11 MED ORDER — LEVONORGESTREL 20 MCG/24HR IU IUD: 1.0000 | INTRAUTERINE_SYSTEM | Freq: Once | INTRAUTERINE | Status: DC

## 2015-07-11 NOTE — Patient Instructions (Signed)
1. Advil 600 mg as needed for cramps 2. Return in 4 weeks for IUD string check  Intrauterine Device Insertion, Care After Refer to this sheet in the next few weeks. These instructions provide you with information on caring for yourself after your procedure. Your health care provider may also give you more specific instructions. Your treatment has been planned according to current medical practices, but problems sometimes occur. Call your health care provider if you have any problems or questions after your procedure. WHAT TO EXPECT AFTER THE PROCEDURE Insertion of the IUD may cause some discomfort, such as cramping. The cramping should improve after the IUD is in place. You may have bleeding after the procedure. This is normal. It varies from light spotting for a few days to menstrual-like bleeding. When the IUD is in place, a string will extend past the cervix into the vagina for 1-2 inches. The strings should not bother you or your partner. If they do, talk to your health care provider.  HOME CARE INSTRUCTIONS   Check your intrauterine device (IUD) to make sure it is in place before you resume sexual activity. You should be able to feel the strings. If you cannot feel the strings, something may be wrong. The IUD may have fallen out of the uterus, or the uterus may have been punctured (perforated) during placement. Also, if the strings are getting longer, it may mean that the IUD is being forced out of the uterus. You no longer have full protection from pregnancy if any of these problems occur.  You may resume sexual intercourse if you are not having problems with the IUD. The copper IUD is considered immediately effective, and the hormone IUD works right away if inserted within 7 days of your period starting. You will need to use a backup method of birth control for 7 days if the IUD in inserted at any other time in your cycle.  Continue to check that the IUD is still in place by feeling for the  strings after every menstrual period.  You may need to take pain medicine such as acetaminophen or ibuprofen. Only take medicines as directed by your health care provider. SEEK MEDICAL CARE IF:   You have bleeding that is heavier or lasts longer than a normal menstrual cycle.  You have a fever.  You have increasing cramps or abdominal pain not relieved with medicine.  You have abdominal pain that does not seem to be related to the same area of earlier cramping and pain.  You are lightheaded, unusually weak, or faint.  You have abnormal vaginal discharge or smells.  You have pain during sexual intercourse.  You cannot feel the IUD strings, or the IUD string has gotten longer.  You feel the IUD at the opening of the cervix in the vagina.  You think you are pregnant, or you miss your menstrual period.  The IUD string is hurting your sex partner. MAKE SURE YOU:  Understand these instructions.  Will watch your condition.  Will get help right away if you are not doing well or get worse.   This information is not intended to replace advice given to you by your health care provider. Make sure you discuss any questions you have with your health care provider.   Document Released: 12/18/2010 Document Revised: 02/09/2013 Document Reviewed: 10/10/2012 Elsevier Interactive Patient Education [employer].

## 2015-07-11 NOTE — Progress Notes (Signed)
IUD Insertion Procedure Note  Pre-operative Diagnosis: Contraception; dysmenorrhea  Post-operative Diagnosis: same  Indications: contraception  Procedure Details  Urine pregnancy test was done and result was Negative.  The risks (including infection, bleeding, pain, and uterine perforation) and benefits of the procedure were explained to the patient and Verbal informed consent was obtained.    Cervix cleansed with Betadine. Uterus sounded to 8 cm. IUD inserted without difficulty. String visible and trimmed. Patient tolerated procedure well.  IUD Information: Mirena, Lot # [id number], Expiration date 05/19.  Condition: Stable  Complications: None  Plan:  The patient was advised to call for any fever or for prolonged or severe pain or bleeding. She was advised to use OTC ibuprofen as needed for mild to moderate pain.   Attending Physician Documentation: Brayton Mars, MD  Note: This dictation was prepared with Dragon dictation along with smaller phrase technology. Any transcriptional errors that result from this process are unintentional.

## 2015-08-08 ENCOUNTER — Encounter: Payer: Self-pay | Admitting: Obstetrics and Gynecology

## 2015-08-08 ENCOUNTER — Ambulatory Visit (INDEPENDENT_AMBULATORY_CARE_PROVIDER_SITE_OTHER): Payer: 59 | Admitting: Obstetrics and Gynecology

## 2015-08-08 VITALS — BP 109/67 | HR 103 | Ht 62.0 in | Wt 193.2 lb

## 2015-08-08 DIAGNOSIS — Z30431 Encounter for routine checking of intrauterine contraceptive device: Secondary | ICD-10-CM | POA: Diagnosis not present

## 2015-08-08 NOTE — Progress Notes (Signed)
Chief complaint: 1. IUD string check  Patient presents for weeks status post Mirena IUD insertion. She had moderate to severe cramps the first 1-2 days associated with some bleeding. Cramps have resolved. Patient is not having any active bleeding.  Past medical history, past surgical history, problem list, medications, and allergies are reviewed.  OBJECTIVE: BP 109/67 mmHg  Pulse 103  Ht 5\' 2"  (1.575 m)  Wt 193 lb 3.2 oz (87.635 kg)  BMI 35.33 kg/m2 Pelvic exam: External genitalia normal BUS-normal Vagina-normal Cervix-normal; IUD string 2 cm Bimanual-deferred  ASSESSMENT: 1. 4 weeks status post Mirena IUD insertion 2. Normal IUD string check  PLAN: 1. Return in 6 months for annual exam  Brayton Mars, MD  Note: This dictation was prepared with Dragon dictation along with smaller phrase technology. Any transcriptional errors that result from this process are unintentional.

## 2015-08-08 NOTE — Patient Instructions (Signed)
1. 1. Return in 6 months for annual exam

## 2015-09-18 ENCOUNTER — Encounter: Payer: Self-pay | Admitting: Family Medicine

## 2015-09-18 ENCOUNTER — Ambulatory Visit (INDEPENDENT_AMBULATORY_CARE_PROVIDER_SITE_OTHER): Payer: 59 | Admitting: Family Medicine

## 2015-09-18 VITALS — BP 116/82 | HR 88 | Temp 98.1°F | Resp 18 | Ht 62.0 in | Wt 195.0 lb

## 2015-09-18 DIAGNOSIS — G4700 Insomnia, unspecified: Secondary | ICD-10-CM | POA: Diagnosis not present

## 2015-09-18 DIAGNOSIS — E669 Obesity, unspecified: Secondary | ICD-10-CM

## 2015-09-18 DIAGNOSIS — M25531 Pain in right wrist: Secondary | ICD-10-CM | POA: Diagnosis not present

## 2015-09-18 DIAGNOSIS — J069 Acute upper respiratory infection, unspecified: Secondary | ICD-10-CM

## 2015-09-18 DIAGNOSIS — F411 Generalized anxiety disorder: Secondary | ICD-10-CM | POA: Diagnosis not present

## 2015-09-18 MED ORDER — NALTREXONE-BUPROPION HCL ER 8-90 MG PO TB12: 2.0000 | ORAL_TABLET | Freq: Two times a day (BID) | ORAL | Status: DC

## 2015-09-18 NOTE — Progress Notes (Signed)
Name: Patricia Horn   MRN: [id number]    DOB: 20-Sep-1990   Date:09/18/2015       Progress Note  Subjective  Chief Complaint  Chief Complaint  Patient presents with  . Anxiety    patient is here for a 35-month f/u  . Insomnia  . Obesity  . Medication Management    patient has been having some issues while on the cymbalta  . Wrist Pain    right wrist pain persists    HPI  Insomnia: resolved, no longer taking medication for sleep. Able to fall asleep and stay asleep, getting about 8 hours per night  GAD: she is frustrated about her weight and thinks it is secondary to SNRI. She was on Celexa for many years prior to switching to Cymbalta, but unable to lose weight .  She states she has been exercising and thinks that it will help deal with anxiety. No panic attack. Done with school, work is stable.   Obesity: she has gained weight since started graduate school back in 2015 . She gained 40 lbs over the past 2 years. Around the time that she started taking Citalopram. She has been changing her diet, snacking more often, healthy meals and snack, and also exercising at least 3 days a week for a minimum of 30 minutes.   DeQuervain's tenosynovitis: pain on right medial wrist has been going on for the past 4 months. She tried ice, nsaid's and thumb spica and pain is getting worse.   URI: symptoms started about 2 weeks ago, rhinorrhea, post-nasal drainage, no fever or chills. Feeling better now with otc medication   Patient Active Problem List   Diagnosis Date Noted  . 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 Obese 01/05/2015  . Umbilical cyst Q000111Q  . Perennial allergic rhinitis 11/16/2014  . Anxiety, generalized 10/06/2014  . ANA positive 10/06/2014  . History of uterine fibroid 10/06/2014    Past Surgical History  Procedure Laterality Date  . Myomectomy  2015  . Hernia repair  [id number]    umbilical     Family History  Problem Relation Age of Onset  . Hypertension Mother   .  Scleroderma Mother   . Pulmonary fibrosis Mother   . Asthma Mother     Social History   Social History  . Marital Status: Single    Spouse Name: N/A  . Number of Children: N/A  . Years of Education: N/A   Occupational History  . Not on file.   Social History Main Topics  . Smoking status: Never Smoker   . Smokeless tobacco: Never Used  . Alcohol Use: 0.6 oz/week    1 Standard drinks or equivalent per week     Comment: ocassional  . Drug Use: No  . Sexual Activity:    Partners: Male    Patent examiner Protection: IUD   Other Topics Concern  . Not on file   Social History Narrative     Current outpatient prescriptions:  .  fluticasone (FLONASE) 50 MCG/ACT nasal spray, Place 2 sprays into both nostrils daily., Disp: 16 g, Rfl: 5 .  levonorgestrel (MIRENA) 20 MCG/24HR IUD, 1 Intra Uterine Device (1 each total) by Intrauterine route once., Disp: 1 each, Rfl: 0 .  loratadine (CLARITIN) 10 MG tablet, Take 1 tablet (10 mg total) by mouth daily., Disp: 30 tablet, Rfl: 5 .  Naltrexone-Bupropion HCl ER (CONTRAVE) 8-90 MG TB12, Take 2 tablets by mouth 2 (two) times daily., Disp: 120 tablet, Rfl: 0  No Known Allergies   ROS  Constitutional: Negative for fever or weight change.  Respiratory: Negative for cough and shortness of breath.   Cardiovascular: Negative for chest pain or palpitations.  Gastrointestinal: Negative for abdominal pain, no bowel changes.  Musculoskeletal: Negative for gait problem or joint swelling.  Skin: Negative for rash.  Neurological: Negative for dizziness or headache.  No other specific complaints in a complete review of systems (except as listed in HPI above).  Objective  Filed Vitals:   09/18/15 1514 09/18/15 1610  BP: 116/82   Pulse: 118 88  Temp: 98.1 F (36.7 C)   TempSrc: Oral   Resp: 18   Height: 5\' 2"  (1.575 m)   Weight: 195 lb (88.451 kg)   SpO2: 97%     Body mass index is 35.66 kg/(m^2).  Physical Exam  Constitutional:  Patient appears well-developed and well-nourished. Obese  No distress.  HEENT: head atraumatic, normocephalic, pupils equal and reactive to light, ears normal TM bilaterally  neck supple, throat within normal limits Cardiovascular: Normal rate, regular rhythm and normal heart sounds.  No murmur heard. No BLE edema. Pulmonary/Chest: Effort normal and breath sounds normal. No respiratory distress. Abdominal: Soft.  There is no tenderness. Psychiatric: Patient has a normal mood and affect. behavior is normal. Judgment and thought content normal.  Recent Results (from the past 2160 hour(s))  POCT urine pregnancy     Status: None   Collection Time: 07/05/15  3:07 PM  Result Value Ref Range   Preg Test, Ur Negative Negative  POCT urine pregnancy     Status: None   Collection Time: 07/11/15  4:23 PM  Result Value Ref Range   Preg Test, Ur Negative Negative     PHQ2/9: Depression screen [facility] 2/9 09/18/2015 06/21/2015 05/10/2015 04/06/2015 12/19/2014  Decreased Interest 0 0 0 0 0  Down, Depressed, Hopeless 0 0 0 0 0  PHQ - 2 Score 0 0 0 0 0     Fall Risk: Fall Risk  09/18/2015 06/21/2015 05/10/2015 04/06/2015 12/19/2014  Falls in the past year? No No No No No     Functional Status Survey: Is the patient deaf or have difficulty hearing?: No Does the patient have difficulty seeing, even when wearing glasses/contacts?: No Does the patient have difficulty concentrating, remembering, or making decisions?: No Does the patient have difficulty walking or climbing stairs?: No Does the patient have difficulty dressing or bathing?: No Does the patient have difficulty doing errands alone such as visiting a doctor's office or shopping?: No    Assessment & Plan  1. Anxiety, generalized  We will try weaning off Cymbalta, may need therapy to control symptoms. Try exercise, breathing paper bag  2. Insomnia  Doing well since started exercising  3. Obese  - Amb ref to Medical Nutrition Therapy-MNT -  Naltrexone-Bupropion HCl ER (CONTRAVE) 8-90 MG TB12; Take 2 tablets by mouth 2 (two) times daily.  Dispense: 120 tablet; Refill: 0 Discussed possible side effects  4. Wrist pain, right  - Ambulatory referral to Orthopedic Surgery  5. Upper respiratory infection  Continue otc medication

## 2015-09-18 NOTE — Patient Instructions (Signed)
Take half amount of beads inside of cymbalta capsule for one week after that that one quarter for one week and stop

## 2015-09-19 ENCOUNTER — Encounter: Payer: Self-pay | Admitting: Family Medicine

## 2015-10-10 ENCOUNTER — Encounter: Payer: Self-pay | Admitting: Family Medicine

## 2015-11-09 ENCOUNTER — Encounter: Payer: Self-pay | Admitting: Family Medicine

## 2015-11-09 ENCOUNTER — Ambulatory Visit (INDEPENDENT_AMBULATORY_CARE_PROVIDER_SITE_OTHER): Payer: 59 | Admitting: Family Medicine

## 2015-11-09 VITALS — BP 126/78 | HR 96 | Temp 98.8°F | Resp 18 | Wt 192.6 lb

## 2015-11-09 DIAGNOSIS — T783XXD Angioneurotic edema, subsequent encounter: Secondary | ICD-10-CM | POA: Diagnosis not present

## 2015-11-09 DIAGNOSIS — L509 Urticaria, unspecified: Secondary | ICD-10-CM | POA: Diagnosis not present

## 2015-11-09 DIAGNOSIS — E669 Obesity, unspecified: Secondary | ICD-10-CM

## 2015-11-09 MED ORDER — NALTREXONE-BUPROPION HCL ER 8-90 MG PO TB12: 2.0000 | ORAL_TABLET | Freq: Two times a day (BID) | ORAL | Status: DC

## 2015-11-09 NOTE — Progress Notes (Signed)
Name: Patricia Horn   MRN: [id number]    DOB: 08/06/1990   Date:11/09/2015       Progress Note  Subjective  Chief Complaint  Chief Complaint  Patient presents with  . Follow-up    patient is here for her 6 weeks f/u  . Anxiety  . Insomnia  . Obesity    HPI  Hives: developed symptoms of generalized hives on arms, legs, face and trunk. She also had swelling of hands and feet, she went to Urgent Care on 06/27 and was given prednisone taper and hydroxyzine and symptoms resolved after one day of prednisone. She has seen ENT since and lab test for allergies was negative. She thinks secondary to mushrooms since symptoms started after eating it for 3 days in a row. Advised referral to Allergist if symptoms recurs  Obesity: started on Contrave on 09/18/2015 at 195 lbs , she was up to 3 pills daily but stopped once diagnosed with hives, she does not think it is related, only lost a few lbs since started medication, but she wants to continue medication, still not on full dose and has noticed decrease in appetite. She is also exercising.   Patient Active Problem List   Diagnosis Date Noted  . Urticaria 11/09/2015  . 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 Obese 01/05/2015  . Umbilical cyst Q000111Q  . Perennial allergic rhinitis 11/16/2014  . Anxiety, generalized 10/06/2014  . ANA positive 10/06/2014  . History of uterine fibroid 10/06/2014    Past Surgical History  Procedure Laterality Date  . Myomectomy  2015  . Hernia repair  [id number]    umbilical     Family History  Problem Relation Age of Onset  . Hypertension Mother   . Scleroderma Mother   . Pulmonary fibrosis Mother   . Asthma Mother     Social History   Social History  . Marital Status: Single    Spouse Name: N/A  . Number of Children: N/A  . Years of Education: N/A   Occupational History  . Not on file.   Social History Main Topics  . Smoking status: Never Smoker   . Smokeless tobacco: Never Used  . Alcohol Use:  0.6 oz/week    1 Standard drinks or equivalent per week     Comment: ocassional  . Drug Use: No  . Sexual Activity:    Partners: Male    Patent examiner Protection: IUD   Other Topics Concern  . Not on file   Social History Narrative     Current outpatient prescriptions:  .  fluticasone (FLONASE) 50 MCG/ACT nasal spray, Place 2 sprays into both nostrils daily., Disp: 16 g, Rfl: 5 .  levonorgestrel (MIRENA) 20 MCG/24HR IUD, 1 Intra Uterine Device (1 each total) by Intrauterine route once., Disp: 1 each, Rfl: 0 .  loratadine (CLARITIN) 10 MG tablet, Take 1 tablet (10 mg total) by mouth daily., Disp: 30 tablet, Rfl: 5 .  Naltrexone-Bupropion HCl ER (CONTRAVE) 8-90 MG TB12, Take 2 tablets by mouth 2 (two) times daily., Disp: 120 tablet, Rfl: 0  No Known Allergies   ROS  Ten systems reviewed and is negative except as mentioned in HPI   Objective  Filed Vitals:   11/09/15 1349 11/09/15 1418  BP: 126/78   Pulse: 115 96  Temp: 98.8 F (37.1 C)   TempSrc: Oral   Resp: 18   Weight: 195 lb 9.6 oz (88.724 kg)   SpO2: 96%     Body mass index is  35.77 kg/(m^2).  Physical Exam  Constitutional: Patient appears well-developed and well-nourished. Obese  No distress.  HEENT: head atraumatic, normocephalic, pupils equal and reactive to light,  neck supple, throat within normal limits Cardiovascular: Normal rate, regular rhythm and normal heart sounds.  No murmur heard. No BLE edema. Pulmonary/Chest: Effort normal and breath sounds normal. No respiratory distress. Abdominal: Soft.  There is no tenderness. Psychiatric: Patient has a normal mood and affect. behavior is normal. Judgment and thought content normal. Skin: no hives or angioedema at this time  PHQ2/9: Depression screen [facility] 2/9 11/09/2015 09/18/2015 06/21/2015 05/10/2015 04/06/2015  Decreased Interest 0 0 0 0 0  Down, Depressed, Hopeless 0 0 0 0 0  PHQ - 2 Score 0 0 0 0 0     Fall Risk: Fall Risk  11/09/2015 09/18/2015  06/21/2015 05/10/2015 04/06/2015  Falls in the past year? No No No No No     Functional Status Survey: Is the patient deaf or have difficulty hearing?: No Does the patient have difficulty seeing, even when wearing glasses/contacts?: No Does the patient have difficulty concentrating, remembering, or making decisions?: No Does the patient have difficulty walking or climbing stairs?: No Does the patient have difficulty dressing or bathing?: No Does the patient have difficulty doing errands alone such as visiting a doctor's office or shopping?: No    Assessment & Plan   1. Urticaria  Resolved now  2. Angioedema, subsequent encounter  Resolved, needs to see Allergist if recurrence of symptoms  3. Obese  Resume medication and titrate up slowly again, she seems to be responding to it - Naltrexone-Bupropion HCl ER (CONTRAVE) 8-90 MG TB12; Take 2 tablets by mouth 2 (two) times daily.  Dispense: 120 tablet; Refill: 2

## 2015-12-12 ENCOUNTER — Encounter: Payer: Self-pay | Admitting: Family Medicine

## 2016-01-30 ENCOUNTER — Encounter: Payer: Self-pay | Admitting: Family Medicine

## 2016-02-06 ENCOUNTER — Encounter: Payer: 59 | Admitting: Obstetrics and Gynecology

## 2016-02-11 ENCOUNTER — Ambulatory Visit: Payer: 59 | Admitting: Family Medicine

## 2016-02-25 NOTE — Progress Notes (Signed)
ANNUAL PREVENTATIVE CARE GYN  ENCOUNTER NOTE  Subjective:       Patricia Horn is a 25 y.o. G0P0000 female here for a routine annual gynecologic exam.  Current complaints: 1.  Increase d/c with iud- thick green- no odor; Occasional discharge  Amenorrhea on IUD. No pelvic pain. Not sexually active. Bowel and bladder function are normal   Gynecologic History No LMP recorded. Patient is not currently having periods (Reason: IUD). Contraception: IUD Last Pap: 06/2014 neg. Results were: normal Last mammogram: n/a. Results were:n/a  Obstetric History OB History  Gravida Para Term Preterm AB Living  0 0 0 0 0 0  SAB TAB Ectopic Multiple Live Births  0 0 0 0        Obstetric Comments  1st Menstrual Cycle:  8    Past Medical History:  Diagnosis Date  . Anxiety   . Depression   . Environmental allergies   . Tendinitis   . Umbilical hernia     Past Surgical History:  Procedure Laterality Date  . HERNIA REPAIR  Q000111Q   umbilical   . MYOMECTOMY  2015    Current Outpatient Prescriptions on File Prior to Visit  Medication Sig Dispense Refill  . fluticasone (FLONASE) 50 MCG/ACT nasal spray Place 2 sprays into both nostrils daily. 16 g 5  . levonorgestrel (MIRENA) 20 MCG/24HR IUD 1 Intra Uterine Device (1 each total) by Intrauterine route once. 1 each 0  . loratadine (CLARITIN) 10 MG tablet Take 1 tablet (10 mg total) by mouth daily. 30 tablet 5  . Naltrexone-Bupropion HCl ER (CONTRAVE) 8-90 MG TB12 Take 2 tablets by mouth 2 (two) times daily. 120 tablet 2   No current facility-administered medications on file prior to visit.     No Known Allergies  Social History   Social History  . Marital status: Single    Spouse name: N/A  . Number of children: N/A  . Years of education: N/A   Occupational History  . Not on file.   Social History Main Topics  . Smoking status: Never Smoker  . Smokeless tobacco: Never Used  . Alcohol use 0.6 oz/week    1 Standard drinks or  equivalent per week     Comment: ocassional  . Drug use: No  . Sexual activity: Not Currently    Partners: Male    Birth control/ protection: IUD   Other Topics Concern  . Not on file   Social History Narrative  . No narrative on file    Family History  Problem Relation Age of Onset  . Hypertension Mother   . Scleroderma Mother   . Pulmonary fibrosis Mother   . Asthma Mother     The following portions of the patient's history were reviewed and updated as appropriate: allergies, current medications, past family history, past medical history, past social history, past surgical history and problem list.  Review of Systems ROS Review of Systems - General ROS: negative for - chills, fatigue, fever, hot flashes, night sweats, weight gain or weight loss Psychological ROS: negative for - anxiety, decreased libido, depression, mood swings, physical abuse or sexual abuse Ophthalmic ROS: negative for - blurry vision, eye pain or loss of vision ENT ROS: negative for - headaches, hearing change, visual changes or vocal changes Allergy and Immunology ROS: negative for - hives, itchy/watery eyes or seasonal allergies Hematological and Lymphatic ROS: negative for - bleeding problems, bruising, swollen lymph nodes or weight loss Endocrine ROS: negative for - galactorrhea, hair  pattern changes, hot flashes, malaise/lethargy, mood swings, palpitations, polydipsia/polyuria, skin changes, temperature intolerance or unexpected weight changes Breast ROS: negative for - new or changing breast lumps or nipple discharge Respiratory ROS: negative for - cough or shortness of breath Cardiovascular ROS: negative for - chest pain, irregular heartbeat, palpitations or shortness of breath Gastrointestinal ROS: no abdominal pain, change in bowel habits, or black or bloody stools Genito-Urinary ROS: no dysuria, trouble voiding, or hematuria Musculoskeletal ROS: negative for - joint pain or joint  stiffness Neurological ROS: negative for - bowel and bladder control changes Dermatological ROS: negative for rash and skin lesion changes   Objective:   BP 101/68   Pulse 82   Ht 5\' 2"  (1.575 m)   Wt 189 lb 4.8 oz (85.9 kg)   LMP  (LMP Unknown)   BMI 34.62 kg/m  CONSTITUTIONAL: Well-developed, well-nourished female in no acute distress.  PSYCHIATRIC: Normal mood and affect. Normal behavior. Normal judgment and thought content. [place]: Alert and oriented to person, place, and time. Normal muscle tone coordination. No cranial nerve deficit noted. HENT:  Normocephalic, atraumatic, External right and left ear normal. Oropharynx is clear and moist EYES: Conjunctivae and EOM are normal. Pupils are equal, round, and reactive to light. No scleral icterus.  NECK: Normal range of motion, supple, no masses.  Normal thyroid.  SKIN: Skin is warm and dry. No rash noted. Not diaphoretic. No erythema. No pallor. CARDIOVASCULAR: Normal heart rate noted, regular rhythm, no murmur. RESPIRATORY: Clear to auscultation bilaterally. Effort and breath sounds normal, no problems with respiration noted. BREASTS: Symmetric in size. No masses, skin changes, nipple drainage, or lymphadenopathy. ABDOMEN: Soft, normal bowel sounds, no distention noted.  No tenderness, rebound or guarding.  BLADDER: Normal PELVIC:  External Genitalia: Normal  BUS: Normal  Vagina: Normal  Cervix: Normal; IUD strings-2 cm  Uterus: Normal; midplane, normal size and shape, mobile, nontender  Adnexa: Normal  RV: External Exam NormaI  MUSCULOSKELETAL: Normal range of motion. No tenderness.  No cyanosis, clubbing, or edema.  2+ distal pulses. LYMPHATIC: No Axillary, Supraclavicular, or Inguinal Adenopathy.    Assessment:   Annual gynecologic examination 25 y.o. Contraception: IUD Mirena [id number] Problem List Items Addressed This Visit    None    Visit Diagnoses    Well woman exam with routine gynecological exam    -   Primary   Uterine leiomyoma, unspecified location       Increased body mass index          Plan:  Pap: Pap, Reflex if ASCUS Mammogram: not indicated Stool Guaiac Testing:  Not Indicated Labs: thru pcp Routine preventative health maintenance measures emphasized: Exercise/Diet/Weight control, Tobacco Warnings, Alcohol/Substance use risks and Safe Sex Return to [place], CMA  Brayton Mars, MD  Note: This dictation was prepared with Dragon dictation along with smaller phrase technology. Any transcriptional errors that result from this process are unintentional.

## 2016-02-27 ENCOUNTER — Encounter: Payer: Self-pay | Admitting: Obstetrics and Gynecology

## 2016-02-27 ENCOUNTER — Ambulatory Visit (INDEPENDENT_AMBULATORY_CARE_PROVIDER_SITE_OTHER): Payer: 59 | Admitting: Obstetrics and Gynecology

## 2016-02-27 VITALS — BP 101/68 | HR 82 | Ht 62.0 in | Wt 189.3 lb

## 2016-02-27 DIAGNOSIS — Z01419 Encounter for gynecological examination (general) (routine) without abnormal findings: Secondary | ICD-10-CM | POA: Diagnosis not present

## 2016-02-27 DIAGNOSIS — R638 Other symptoms and signs concerning food and fluid intake: Secondary | ICD-10-CM | POA: Diagnosis not present

## 2016-02-27 DIAGNOSIS — D259 Leiomyoma of uterus, unspecified: Secondary | ICD-10-CM | POA: Diagnosis not present

## 2016-02-27 DIAGNOSIS — Z975 Presence of (intrauterine) contraceptive device: Secondary | ICD-10-CM

## 2016-02-27 NOTE — Patient Instructions (Signed)
1. Pap smear is done 2. Self breast awareness is encouraged 3. Continue with healthy eating and exercise and continued slow steady weight loss 4. Screening labs will be done through primary care 5. Contraception-IUD 6. Safe sex practices-condoms encouraged 7. Return in 1 year  Health Maintenance, Female Adopting a healthy lifestyle and getting preventive care can go a long way to promote health and wellness. Talk with your health care provider about what schedule of regular examinations is right for you. This is a good chance for you to check in with your provider about disease prevention and staying healthy. In between checkups, there are plenty of things you can do on your own. Experts have done a lot of research about which lifestyle changes and preventive measures are most likely to keep you healthy. Ask your health care provider for more information. WEIGHT AND DIET  Eat a healthy diet  Be sure to include plenty of vegetables, fruits, low-fat dairy products, and lean protein.  Do not eat a lot of foods high in solid fats, added sugars, or salt.  Get regular exercise. This is one of the most important things you can do for your health.  Most adults should exercise for at least 150 minutes each week. The exercise should increase your heart rate and make you sweat (moderate-intensity exercise).  Most adults should also do strengthening exercises at least twice a week. This is in addition to the moderate-intensity exercise.  Maintain a healthy weight  Body mass index (BMI) is a measurement that can be used to identify possible weight problems. It estimates body fat based on height and weight. Your health care provider can help determine your BMI and help you achieve or maintain a healthy weight.  For females 82 years of age and older:   A BMI below 18.5 is considered underweight.  A BMI of 18.5 to 24.9 is normal.  A BMI of 25 to 29.9 is considered overweight.  A BMI of 30 and  above is considered obese.  Watch levels of cholesterol and blood lipids  You should start having your blood tested for lipids and cholesterol at 27 years of age, then have this test every 5 years.  You may need to have your cholesterol levels checked more often if:  Your lipid or cholesterol levels are high.  You are older than 27 years of age.  You are at high risk for heart disease.  CANCER SCREENING   Lung Cancer  Lung cancer screening is recommended for adults 55-43 years old who are at high risk for lung cancer because of a history of smoking.  A yearly low-dose CT scan of the lungs is recommended for people who:  Currently smoke.  Have quit within the past 15 years.  Have at least a 30-pack-year history of smoking. A pack year is smoking an average of one pack of cigarettes a day for 1 year.  Yearly screening should continue until it has been 15 years since you quit.  Yearly screening should stop if you develop a health problem that would prevent you from having lung cancer treatment.  Breast Cancer  Practice breast self-awareness. This means understanding how your breasts normally appear and feel.  It also means doing regular breast self-exams. Let your health care provider know about any changes, no matter how small.  If you are in your 20s or 30s, you should have a clinical breast exam (CBE) by a health care provider every 1-3 years as part of a  regular health exam.  If you are 40 or older, have a CBE every year. Also consider having a breast X-ray (mammogram) every year.  If you have a family history of breast cancer, talk to your health care provider about genetic screening.  If you are at high risk for breast cancer, talk to your health care provider about having an MRI and a mammogram every year.  Breast cancer gene (BRCA) assessment is recommended for women who have family members with BRCA-related cancers. BRCA-related cancers  include:  Breast.  Ovarian.  Tubal.  Peritoneal cancers.  Results of the assessment will determine the need for genetic counseling and BRCA1 and BRCA2 testing. Cervical Cancer Your health care provider may recommend that you be screened regularly for cancer of the pelvic organs (ovaries, uterus, and vagina). This screening involves a pelvic examination, including checking for microscopic changes to the surface of your cervix (Pap test). You may be encouraged to have this screening done every 3 years, beginning at age 21.  For women ages 30-65, health care providers may recommend pelvic exams and Pap testing every 3 years, or they may recommend the Pap and pelvic exam, combined with testing for human papilloma virus (HPV), every 5 years. Some types of HPV increase your risk of cervical cancer. Testing for HPV may also be done on women of any age with unclear Pap test results.  Other health care providers may not recommend any screening for nonpregnant women who are considered low risk for pelvic cancer and who do not have symptoms. Ask your health care provider if a screening pelvic exam is right for you.  If you have had past treatment for cervical cancer or a condition that could lead to cancer, you need Pap tests and screening for cancer for at least 20 years after your treatment. If Pap tests have been discontinued, your risk factors (such as having a new sexual partner) need to be reassessed to determine if screening should resume. Some women have medical problems that increase the chance of getting cervical cancer. In these cases, your health care provider may recommend more frequent screening and Pap tests. Colorectal Cancer  This type of cancer can be detected and often prevented.  Routine colorectal cancer screening usually begins at 27 years of age and continues through 27 years of age.  Your health care provider may recommend screening at an earlier age if you have risk factors for  colon cancer.  Your health care provider may also recommend using home test kits to check for hidden blood in the stool.  A small camera at the end of a tube can be used to examine your colon directly (sigmoidoscopy or colonoscopy). This is done to check for the earliest forms of colorectal cancer.  Routine screening usually begins at age 50.  Direct examination of the colon should be repeated every 5-10 years through 27 years of age. However, you may need to be screened more often if early forms of precancerous polyps or small growths are found. Skin Cancer  Check your skin from head to toe regularly.  Tell your health care provider about any new moles or changes in moles, especially if there is a change in a mole's shape or color.  Also tell your health care provider if you have a mole that is larger than the size of a pencil eraser.  Always use sunscreen. Apply sunscreen liberally and repeatedly throughout the day.  Protect yourself by wearing long sleeves, pants, a wide-brimmed hat,   and sunglasses whenever you are outside. HEART DISEASE, DIABETES, AND HIGH BLOOD PRESSURE   High blood pressure causes heart disease and increases the risk of stroke. High blood pressure is more likely to develop in:  People who have blood pressure in the high end of the normal range (130-139/85-89 mm Hg).  People who are overweight or obese.  People who are African American.  If you are 73-98 years of age, have your blood pressure checked every 3-5 years. If you are 1 years of age or older, have your blood pressure checked every year. You should have your blood pressure measured twice--once when you are at a hospital or clinic, and once when you are not at a hospital or clinic. Record the average of the two measurements. To check your blood pressure when you are not at a hospital or clinic, you can use:  An automated blood pressure machine at a pharmacy.  A home blood pressure monitor.  If you  are between 55 years and 40 years old, ask your health care provider if you should take aspirin to prevent strokes.  Have regular diabetes screenings. This involves taking a blood sample to check your fasting blood sugar level.  If you are at a normal weight and have a low risk for diabetes, have this test once every three years after 27 years of age.  If you are overweight and have a high risk for diabetes, consider being tested at a younger age or more often. PREVENTING INFECTION  Hepatitis B  If you have a higher risk for hepatitis B, you should be screened for this virus. You are considered at high risk for hepatitis B if:  You were born in a country where hepatitis B is common. Ask your health care provider which countries are considered high risk.  Your parents were born in a high-risk country, and you have not been immunized against hepatitis B (hepatitis B vaccine).  You have HIV or AIDS.  You use needles to inject street drugs.  You live with someone who has hepatitis B.  You have had sex with someone who has hepatitis B.  You get hemodialysis treatment.  You take certain medicines for conditions, including cancer, organ transplantation, and autoimmune conditions. Hepatitis C  Blood testing is recommended for:  Everyone born from [birth year] through 1967.  Anyone with known risk factors for hepatitis C. Sexually transmitted infections (STIs)  You should be screened for sexually transmitted infections (STIs) including gonorrhea and chlamydia if:  You are sexually active and are younger than 27 years of age.  You are older than 27 years of age and your health care provider tells you that you are at risk for this type of infection.  Your sexual activity has changed since you were last screened and you are at an increased risk for chlamydia or gonorrhea. Ask your health care provider if you are at risk.  If you do not have HIV, but are at risk, it may be recommended that you  take a prescription medicine daily to prevent HIV infection. This is called pre-exposure prophylaxis (PrEP). You are considered at risk if:  You are sexually active and do not regularly use condoms or know the HIV status of your partner(s).  You take drugs by injection.  You are sexually active with a partner who has HIV. Talk with your health care provider about whether you are at high risk of being infected with HIV. If you choose to begin PrEP, you should  first be tested for HIV. You should then be tested every 3 months for as long as you are taking PrEP.  PREGNANCY   If you are premenopausal and you may become pregnant, ask your health care provider about preconception counseling.  If you may become pregnant, take 400 to 800 micrograms (mcg) of folic acid every day.  If you want to prevent pregnancy, talk to your health care provider about birth control (contraception). OSTEOPOROSIS AND MENOPAUSE   Osteoporosis is a disease in which the bones lose minerals and strength with aging. This can result in serious bone fractures. Your risk for osteoporosis can be identified using a bone density scan.  If you are 42 years of age or older, or if you are at risk for osteoporosis and fractures, ask your health care provider if you should be screened.  Ask your health care provider whether you should take a calcium or vitamin D supplement to lower your risk for osteoporosis.  Menopause may have certain physical symptoms and risks.  Hormone replacement therapy may reduce some of these symptoms and risks. Talk to your health care provider about whether hormone replacement therapy is right for you.  HOME CARE INSTRUCTIONS   Schedule regular health, dental, and eye exams.  Stay current with your immunizations.   Do not use any tobacco products including cigarettes, chewing tobacco, or electronic cigarettes.  If you are pregnant, do not drink alcohol.  If you are breastfeeding, limit how  much and how often you drink alcohol.  Limit alcohol intake to no more than 1 drink per day for nonpregnant women. One drink equals 12 ounces of beer, 5 ounces of wine, or 1 ounces of hard liquor.  Do not use street drugs.  Do not share needles.  Ask your health care provider for help if you need support or information about quitting drugs.  Tell your health care provider if you often feel depressed.  Tell your health care provider if you have ever been abused or do not feel safe at home.   This information is not intended to replace advice given to you by your health care provider. Make sure you discuss any questions you have with your health care provider.   Document Released: 11/04/2010 Document Revised: 05/12/2014 Document Reviewed: 03/23/2013 Elsevier Interactive Patient Education [employer].

## 2016-03-03 LAB — PAP IG W/ RFLX HPV ASCU: PAP Smear Comment: 0

## 2016-06-26 ENCOUNTER — Ambulatory Visit (INDEPENDENT_AMBULATORY_CARE_PROVIDER_SITE_OTHER): Payer: 59 | Admitting: Family Medicine

## 2016-06-26 ENCOUNTER — Encounter: Payer: Self-pay | Admitting: Family Medicine

## 2016-06-26 VITALS — BP 110/62 | HR 78 | Temp 98.3°F | Resp 16 | Wt 179.2 lb

## 2016-06-26 DIAGNOSIS — J069 Acute upper respiratory infection, unspecified: Secondary | ICD-10-CM

## 2016-06-26 DIAGNOSIS — J0100 Acute maxillary sinusitis, unspecified: Secondary | ICD-10-CM

## 2016-06-26 DIAGNOSIS — J302 Other seasonal allergic rhinitis: Secondary | ICD-10-CM | POA: Diagnosis not present

## 2016-06-26 MED ORDER — FLUTICASONE PROPIONATE 50 MCG/ACT NA SUSP: 2.0000 | Freq: Every day | NASAL | 11 refills | Status: DC

## 2016-06-26 MED ORDER — AMOXICILLIN-POT CLAVULANATE 875-125 MG PO TABS: 1.0000 | ORAL_TABLET | Freq: Two times a day (BID) | ORAL | 0 refills | Status: DC

## 2016-06-26 MED ORDER — PREDNISONE 10 MG PO TABS: 30.0000 mg | ORAL_TABLET | Freq: Every day | ORAL | 0 refills | Status: DC

## 2016-06-26 NOTE — Patient Instructions (Signed)
Please do eat yogurt daily or take a probiotic daily for the next month or two We want to replace the healthy germs in the gut If you notice foul, watery diarrhea in the next two months, schedule an appointment RIGHT AWAY Try vitamin C (orange juice if not diabetic or vitamin C tablets) and drink green tea to help your immune system during your illness Get plenty of rest and hydration

## 2016-06-26 NOTE — Progress Notes (Signed)
BP 110/62   Pulse 78   Temp 98.3 F (36.8 C) (Oral)   Resp 16   Wt 179 lb 4 oz (81.3 kg)   SpO2 98%   BMI 32.79 kg/m    Subjective:    Patient ID: Patricia Horn, female    DOB: [birth year], 26 y.o.   MRN: [id number]  HPI: Patricia Horn is a 26 y.o. female  Chief Complaint  Patient presents with  . URI    Congestion, both ears are clogged. reoccurring. 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 Anxiety   . Depression   . Environmental allergies   . Tendinitis   . Umbilical hernia    Family History  Problem Relation Age of Onset  . Hypertension Mother   . Scleroderma Mother   . Pulmonary fibrosis Mother   . Asthma Mother   . Cancer Neg Hx   MD note: her mother is getting ready for a lung transplant at [place] History  Substance Use Topics  . Smoking status: Never Smoker  . Smokeless tobacco: Never Used  . Alcohol use 0.6 oz/week    1 Standard drinks or equivalent per week     Comment: ocassional   Interim medical history since last visit reviewed. Allergies and medications reviewed  Review of Systems Per HPI unless specifically indicated above     Objective:    BP 110/62   Pulse 78   Temp 98.3 F (36.8 C) (Oral)   Resp 16   Wt 179 lb  4 oz (81.3 kg)   SpO2 98%   BMI 32.79 kg/m   Wt Readings from Last 3 Encounters:  06/26/16 179 lb 4 oz (81.3 kg)  02/27/16 189 lb 4.8 oz (85.9 kg)  11/09/15 192 lb 9.6 oz (87.4 kg)    Physical Exam  Constitutional: She appears well-developed and well-nourished. No distress.  HENT:  Right Ear: Hearing, tympanic membrane, external ear and ear canal normal. No drainage. Tympanic membrane is not erythematous. No middle ear effusion.  Left Ear: Hearing, tympanic membrane, external ear and ear canal normal. No drainage. Tympanic membrane is not erythematous.  No middle ear effusion.  Nose: Mucosal edema and rhinorrhea present.  Mouth/Throat: Mucous membranes are normal. Posterior oropharyngeal erythema (very mild injection) present. No oropharyngeal exudate or posterior oropharyngeal edema.  White drainage on both sides of nasal passages, right worse than left; turbinates are enlarged bilaterally  Eyes: EOM are normal. No scleral icterus.  Neck: No thyromegaly present.  Cardiovascular: Normal rate, regular rhythm and normal heart sounds.   No murmur heard. Pulmonary/Chest: Effort normal and breath sounds normal. No respiratory distress. She has no decreased  breath sounds. She has no wheezes. She has no rhonchi.  Abdominal: She exhibits no distension.  Lymphadenopathy:    She has no cervical adenopathy.  Skin: No pallor.  Psychiatric: Her behavior is normal. Judgment and thought content normal. Her mood appears not anxious. She does not exhibit a depressed mood.      Assessment & Plan:   Problem List Items Addressed This Visit    None    Visit Diagnoses    Acute maxillary sinusitis, recurrence not specified    -  Primary   would not recommend she start antibiotics right away; discussed risk of ABX, can lead to C diff; if worsening sx, purulence, etc then start   Relevant Medications   predniSONE (DELTASONE) 10 MG tablet   amoxicillin-clavulanate (AUGMENTIN) 875-125 MG tablet    fluticasone (FLONASE) 50 MCG/ACT nasal spray   Other seasonal allergic rhinitis       Relevant Medications   fluticasone (FLONASE) 50 MCG/ACT nasal spray   Upper respiratory tract infection, unspecified type       suspect this may be viral; explained symptomatic care; green tea, vitamin C, contagious      Follow up plan: No Follow-up on file.  An after-visit summary was printed and given to the patient at [facility].  Please see the patient instructions which may contain other information and recommendations beyond what is mentioned above in the assessment and plan.  Meds ordered this encounter  Medications  . predniSONE (DELTASONE) 10 MG tablet    Sig: Take 3 tablets (30 mg total) by mouth daily with breakfast.    Dispense:  15 tablet    Refill:  0  . amoxicillin-clavulanate (AUGMENTIN) 875-125 MG tablet    Sig: Take 1 tablet by mouth 2 (two) times daily.    Dispense:  20 tablet    Refill:  0  . fluticasone (FLONASE) 50 MCG/ACT nasal spray    Sig: Place 2 sprays into both nostrils daily.    Dispense:  16 g    Refill:  11    No orders of the defined types were placed in this encounter.

## 2016-08-15 ENCOUNTER — Ambulatory Visit (INDEPENDENT_AMBULATORY_CARE_PROVIDER_SITE_OTHER): Payer: 59 | Admitting: Family Medicine

## 2016-08-15 ENCOUNTER — Encounter: Payer: Self-pay | Admitting: Family Medicine

## 2016-08-15 VITALS — BP 114/60 | HR 107 | Temp 98.5°F | Resp 16 | Ht 62.0 in | Wt 176.4 lb

## 2016-08-15 DIAGNOSIS — R350 Frequency of micturition: Secondary | ICD-10-CM

## 2016-08-15 DIAGNOSIS — N760 Acute vaginitis: Secondary | ICD-10-CM | POA: Diagnosis not present

## 2016-08-15 MED ORDER — FLUCONAZOLE 150 MG PO TABS: 150.0000 mg | ORAL_TABLET | ORAL | 0 refills | Status: DC

## 2016-08-15 NOTE — Progress Notes (Addendum)
Name: Patricia Horn   MRN: [id number]    DOB: May 15, 1990   Date:08/15/2016       Progress Note  Subjective  Chief Complaint  Chief Complaint  Patient presents with  . Vaginitis    some itching and discharge since she had taken antibiotic for sinus infection      HPI   Vaginitis: she took Augmentin at the end of Feb and shortly after developed vaginal itching, followed by discharge. The discharge is described as watery after she voids. No dysuria but she has urinary frequency. She tried otc anti-fungal medication without improvement. Denies fever, chills or back pain  Patient Active Problem List   Diagnosis Date Noted  . Urticaria 11/09/2015  . 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 Obese 01/05/2015  . Umbilical cyst 93/71/6967  . Perennial allergic rhinitis 11/16/2014  . Anxiety, generalized 10/06/2014  . ANA positive 10/06/2014  . History of uterine fibroid 10/06/2014    Past Surgical History:  Procedure Laterality Date  . HERNIA REPAIR  8938   umbilical   . MYOMECTOMY  2015    Family History  Problem Relation Age of Onset  . Hypertension Mother   . Scleroderma Mother   . Pulmonary fibrosis Mother   . Asthma Mother   . Cancer Neg Hx     Social History   Social History  . Marital status: Single    Spouse name: N/A  . Number of children: N/A  . Years of education: N/A   Occupational History  . Not on file.   Social History Main Topics  . Smoking status: Never Smoker  . Smokeless tobacco: Never Used  . Alcohol use 0.6 oz/week    1 Standard drinks or equivalent per week     Comment: ocassional  . Drug use: No  . Sexual activity: Not Currently    Partners: Male    Birth control/ protection: IUD   Other Topics Concern  . Not on file   Social History Narrative  . No narrative on file     Current Outpatient Prescriptions:  .  fluticasone (FLONASE) 50 MCG/ACT nasal spray, Place 2 sprays into both nostrils daily., Disp: 16 g, Rfl: 11 .  levonorgestrel  (MIRENA) 20 MCG/24HR IUD, 1 Intra Uterine Device (1 each total) by Intrauterine route once., Disp: 1 each, Rfl: 0 .  loratadine (CLARITIN) 10 MG tablet, Take 1 tablet (10 mg total) by mouth daily., Disp: 30 tablet, Rfl: 5 .  fluconazole (DIFLUCAN) 150 MG tablet, Take 1 tablet (150 mg total) by mouth every other day., Disp: 3 tablet, Rfl: 0  No Known Allergies   ROS  Ten systems reviewed and is negative except as mentioned in HPI   Objective  Vitals:   08/15/16 1506  BP: 114/60  Pulse: (!) 107  Resp: 16  Temp: 98.5 F (36.9 C)  SpO2: 99%  Weight: 176 lb 6 oz (80 kg)  Height: 5\' 2"  (1.575 m)    Body mass index is 32.26 kg/m.  Physical Exam  Constitutional: Patient appears well-developed and well-nourished. Obese  No distress.  HEENT: head atraumatic, normocephalic, pupils equal and reactive to light, neck supple, throat within normal limits Cardiovascular: Normal rate, regular rhythm and normal heart sounds.  No murmur heard. No BLE edema. Pulmonary/Chest: Effort normal and breath sounds normal. No respiratory distress. Abdominal: Soft.  There is no tenderness. Psychiatric: Patient has a normal mood and affect. behavior is normal. Judgment and thought content normal. Gyn:   PHQ2/9: Depression screen  [facility] 2/9 06/26/2016 11/09/2015 09/18/2015 06/21/2015 05/10/2015  Decreased Interest 0 0 0 0 0  Down, Depressed, Hopeless 0 0 0 0 0  PHQ - 2 Score 0 0 0 0 0     Fall Risk: Fall Risk  06/26/2016 11/09/2015 09/18/2015 06/21/2015 05/10/2015  Falls in the past year? No No No No No    Assessment & Plan  1. Vaginitis and vulvovaginitis  - Wet prep, genital - fluconazole (DIFLUCAN) 150 MG tablet; Take 1 tablet (150 mg total) by mouth every other day.  Dispense: 3 tablet; Refill: 0  2. Urinary frequency  - Urine Culture

## 2016-08-15 NOTE — Addendum Note (Signed)
Addended by: Inda Coke on: 08/15/2016 03:46 PM   Modules accepted: Orders

## 2016-08-16 LAB — WET PREP BY MOLECULAR PROBE
Candida species: DETECTED — AB
GARDNERELLA VAGINALIS: NOT DETECTED
TRICHOMONAS VAG: NOT DETECTED

## 2016-08-17 LAB — URINE CULTURE

## 2016-08-18 ENCOUNTER — Encounter: Payer: Self-pay | Admitting: Family Medicine

## 2016-10-27 ENCOUNTER — Encounter: Payer: Self-pay | Admitting: Family Medicine

## 2016-11-06 DIAGNOSIS — G5601 Carpal tunnel syndrome, right upper limb: Secondary | ICD-10-CM | POA: Diagnosis not present

## 2016-11-10 ENCOUNTER — Encounter: Payer: Self-pay | Admitting: Family Medicine

## 2016-11-10 DIAGNOSIS — G5601 Carpal tunnel syndrome, right upper limb: Secondary | ICD-10-CM | POA: Insufficient documentation

## 2017-01-06 ENCOUNTER — Ambulatory Visit (INDEPENDENT_AMBULATORY_CARE_PROVIDER_SITE_OTHER): Payer: 59 | Admitting: Obstetrics and Gynecology

## 2017-01-06 ENCOUNTER — Encounter: Payer: Self-pay | Admitting: Obstetrics and Gynecology

## 2017-01-06 VITALS — BP 103/67 | HR 99 | Ht 62.0 in | Wt 175.3 lb

## 2017-01-06 DIAGNOSIS — L748 Other eccrine sweat disorders: Secondary | ICD-10-CM

## 2017-01-06 DIAGNOSIS — Z975 Presence of (intrauterine) contraceptive device: Secondary | ICD-10-CM | POA: Diagnosis not present

## 2017-01-06 DIAGNOSIS — L74519 Primary focal hyperhidrosis, unspecified: Secondary | ICD-10-CM | POA: Diagnosis not present

## 2017-01-06 DIAGNOSIS — D259 Leiomyoma of uterus, unspecified: Secondary | ICD-10-CM | POA: Diagnosis not present

## 2017-01-06 DIAGNOSIS — R61 Generalized hyperhidrosis: Secondary | ICD-10-CM

## 2017-01-06 DIAGNOSIS — N898 Other specified noninflammatory disorders of vagina: Secondary | ICD-10-CM

## 2017-01-06 DIAGNOSIS — L750 Bromhidrosis: Secondary | ICD-10-CM

## 2017-01-06 MED ORDER — ALUMINUM CHLORIDE 20 % EX SOLN: Freq: Every day | CUTANEOUS | 3 refills | Status: DC

## 2017-01-06 NOTE — Patient Instructions (Addendum)
1. Wet prep today did not reveal infectious source of odor 2. Nu swab is performed to rule out bacterial vaginosis 3. Begin prescription strength anti-perspiration every night to see how this impacts on perspiration and odor 4. Return in 4 weeks for annual physical as scheduled

## 2017-01-06 NOTE — Progress Notes (Signed)
Chief complaint: 1. Increased body odor 2. Increased sweating 3. Vaginal discharge  Patient has noted over the past 6 months increase in vaginal discharge; it is characterized as clear, thick, occasionally green in color, and occasionally with malodor. She denies itching.  Patient also is complaining of increased perspiration requiring her to use an antiperspirant 2 or 3 times a day. She also has noted increased body odor at the same time and frequent bathing does not seem to eliminate the symptoms. She has no unusual food intake. She has no newly identified diagnoses. She has not used any prescription strength antiperspirants.  Past medical history, past surgical history, problem list, medications, and allergies are reviewed  OBJECTIVE: BP 103/67   Pulse 99   Ht 5\' 2"  (1.575 m)   Wt 175 lb 4.8 oz (79.5 kg)   LMP  (LMP Unknown)   BMI 32.06 kg/m  Pleasant female in no acute distress. Alert and oriented. No apparent malodor. Abdomen: Soft, nontender; no organomegaly Pelvic exam: External genitalia-normal BUS-normal Vagina-yellow white secretions present, average amount, without odor; no lesions Cervix-IUD string not visualized; no cervical motion tenderness Uterus-midplane, normal size and shape, mobile, nontender Adnexa-nonpalpable nontender Rectovaginal-normal external exam  PROCEDURE: Wet prep KOH-negative for yeast Normal saline-small number of white blood cells; no clue cells; no Trichomonas  ASSESSMENT: 1. Hyperhidrosis 2. Increased body odor, unclear etiology 3. Chronic vaginal discharge since IUD insertion; wet prep-negative for abnormality 4. Amenorrhea state; previously performed ultrasound confirms IUD location within the uterus 5. History of uterine fibroid  PLAN: 1. Nu swab to rule out bacterial vaginosis 2. Trial of Drysol antiperspirant (prescription strength) 3. Return in 1 month for annual exam as scheduled in follow-up on symptomatology; consider getting an  ultrasound after annual exam to confirm IUD location and to assess uterine fibroid size.  A total of 15 minutes were spent face-to-face with the patient during this encounter and over half of that time dealt with counseling and coordination of care.  Brayton Mars, MD  Note: This dictation was prepared with Dragon dictation along with smaller phrase technology. Any transcriptional errors that result from this process are unintentional.

## 2017-01-10 LAB — NUSWAB BV AND CANDIDA, NAA
Candida albicans, NAA: NEGATIVE
Candida glabrata, NAA: POSITIVE — AB

## 2017-01-12 ENCOUNTER — Telehealth: Payer: Self-pay

## 2017-01-12 MED ORDER — TERCONAZOLE 0.4 % VA CREA: 1.0000 | TOPICAL_CREAM | Freq: Every day | VAGINAL | 0 refills | Status: DC

## 2017-01-12 NOTE — Telephone Encounter (Signed)
-----   Message from Brayton Mars, MD sent at 01/12/2017 10:04 AM EDT ----- Please notify - Abnormal Labs Please call in Terazol 7

## 2017-01-28 ENCOUNTER — Ambulatory Visit (INDEPENDENT_AMBULATORY_CARE_PROVIDER_SITE_OTHER): Payer: 59 | Admitting: Family Medicine

## 2017-01-28 ENCOUNTER — Encounter: Payer: Self-pay | Admitting: Family Medicine

## 2017-01-28 VITALS — BP 96/70 | HR 88 | Temp 98.7°F | Resp 12 | Wt 174.8 lb

## 2017-01-28 DIAGNOSIS — N760 Acute vaginitis: Secondary | ICD-10-CM

## 2017-01-28 DIAGNOSIS — Z23 Encounter for immunization: Secondary | ICD-10-CM

## 2017-01-28 DIAGNOSIS — R350 Frequency of micturition: Secondary | ICD-10-CM | POA: Diagnosis not present

## 2017-01-28 DIAGNOSIS — H538 Other visual disturbances: Secondary | ICD-10-CM | POA: Diagnosis not present

## 2017-01-28 LAB — POCT URINALYSIS DIPSTICK
Bilirubin, UA: NEGATIVE
Blood, UA: NEGATIVE
GLUCOSE UA: NEGATIVE
Ketones, UA: NEGATIVE
LEUKOCYTES UA: NEGATIVE
NITRITE UA: NEGATIVE
PH UA: 8.5 — AB (ref 5.0–8.0)
Protein, UA: NEGATIVE
Spec Grav, UA: 1.01 (ref 1.010–1.025)
Urobilinogen, UA: NEGATIVE E.U./dL — AB

## 2017-01-28 LAB — POCT GLYCOSYLATED HEMOGLOBIN (HGB A1C): HEMOGLOBIN A1C: 5.3

## 2017-01-28 MED ORDER — FLUCONAZOLE 150 MG PO TABS: 150.0000 mg | ORAL_TABLET | ORAL | 0 refills | Status: DC

## 2017-01-28 MED ORDER — CIPROFLOXACIN HCL 250 MG PO TABS: 250.0000 mg | ORAL_TABLET | Freq: Two times a day (BID) | ORAL | 0 refills | Status: DC

## 2017-01-28 NOTE — Progress Notes (Signed)
Name: Patricia Horn   MRN: [id number]    DOB: [birth year]   Date:01/28/2017       Progress Note  Subjective  Chief Complaint  Chief Complaint  Patient presents with  . urinary symptoms    HPI  Vaginal discharge: recently seen by Dr. Enzo Bi, used topical medication but still has some discharge, but no longer has itching. Not sexually active  Urinary frequency: she states that two days ago she noticed blurred vision, urinary frequency, mild dizziness. She checked glucose at home last night and it was 150 . There is family history of diabetes. Her weight is stable. She denies dysuria or blood in urine. She has noticed nocturia, and having to void every 10-15 minutes yesterday, with some hesitancy, a little less frequent now.    Patient Active Problem List   Diagnosis Date Noted  . Right carpal tunnel syndrome 11/10/2016  . Urticaria 11/09/2015  . 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 Obese 01/05/2015  . Umbilical cyst 28/31/5176  . Perennial allergic rhinitis 11/16/2014  . Anxiety, generalized 10/06/2014  . ANA positive 10/06/2014  . History of uterine fibroid 10/06/2014    Past Surgical History:  Procedure Laterality Date  . HERNIA REPAIR  1607   umbilical   . MYOMECTOMY  2015    Family History  Problem Relation Age of Onset  . Hypertension Mother   . Scleroderma Mother   . Pulmonary fibrosis Mother   . Asthma Mother   . Cancer Neg Hx     Social History   Social History  . Marital status: Single    Spouse name: N/A  . Number of children: N/A  . Years of education: N/A   Occupational History  . Not on file.   Social History Main Topics  . Smoking status: Never Smoker  . Smokeless tobacco: Never Used  . Alcohol use 0.6 oz/week    1 Standard drinks or equivalent per week     Comment: ocassional  . Drug use: No  . Sexual activity: Not Currently    Partners: Male    Birth control/ protection: IUD   Other Topics Concern  . Not on file   Social History  Narrative  . No narrative on file     Current Outpatient Prescriptions:  .  aluminum chloride (DRYSOL) 20 % external solution, Apply topically at bedtime., Disp: 35 mL, Rfl: 3 .  fluticasone (FLONASE) 50 MCG/ACT nasal spray, Place 2 sprays into both nostrils daily., Disp: 16 g, Rfl: 11 .  levonorgestrel (MIRENA) 20 MCG/24HR IUD, 1 Intra Uterine Device (1 each total) by Intrauterine route once., Disp: 1 each, Rfl: 0 .  loratadine (CLARITIN) 10 MG tablet, Take 1 tablet (10 mg total) by mouth daily., Disp: 30 tablet, Rfl: 5 .  fluconazole (DIFLUCAN) 150 MG tablet, Take 1 tablet (150 mg total) by mouth every other day., Disp: 2 tablet, Rfl: 0  No Known Allergies   ROS  Constitutional: Negative for fever or weight change.  Respiratory: Negative for cough and shortness of breath.   Cardiovascular: Negative for chest pain or palpitations.  Gastrointestinal: Negative for abdominal pain, no bowel changes.  Musculoskeletal: Negative for gait problem or joint swelling.  Skin: Negative for rash.  Neurological: Negative for dizziness or headache.  No other specific complaints in a complete review of systems (except as listed in HPI above).  Objective  Vitals:   01/28/17 1533  BP: 96/70  Pulse: 88  Resp: 12  Temp: 98.7 F (37.1 C)  TempSrc: Oral  SpO2: 99%  Weight: 174 lb 12.8 oz (79.3 kg)    Body mass index is 31.97 kg/m.  Physical Exam  Constitutional: Patient appears well-developed and well-nourished. Obese No distress.  HEENT: head atraumatic, normocephalic, pupils equal and reactive to light, neck supple, throat within normal limits Cardiovascular: Normal rate, regular rhythm and normal heart sounds.  No murmur heard. No BLE edema. Pulmonary/Chest: Effort normal and breath sounds normal. No respiratory distress. Abdominal: Soft.  There is no tenderness. Negative CVA tenderness Psychiatric: Patient has a normal mood and affect. behavior is normal. Judgment and thought content  normal.  Recent Results (from the past 2160 hour(s))  NuSwab BV and Candida, NAA     Status: Abnormal   Collection Time: 01/06/17  3:18 PM  Result Value Ref Range   Atopobium vaginae Low - 0 Score   BVAB 2 Low - 0 Score   Megasphaera 1 Low - 0 Score    Comment: Calculate total score by adding the 3 individual bacterial vaginosis (BV) marker scores together.  Total score is interpreted as follows: Total score 0-1: Indicates the absence of BV. Total score   2: Indeterminate for BV. Additional clinical                  data should be evaluated to establish a                  diagnosis. Total score 3-6: Indicates the presence of BV. This test was developed and its performance characteristics determined by [facility].  It has not been cleared or approved by the Food and Drug Administration.  The FDA has determined that such clearance or approval is not necessary.    Candida albicans, NAA Negative Negative   Candida glabrata, NAA Positive (A) Negative    Comment: This test was developed and its performance characteristics determined by [facility].  It has not been cleared or approved by the Food and Drug Administration.  The FDA has determined that such clearance or approval is not necessary. Published data demonstrate that up to 65% of Candida glabrata identified in cases of vaginal candidiasis have decreased susceptibility to fluconazole.   POCT Urinalysis Dipstick     Status: Normal   Collection Time: 01/28/17  3:35 PM  Result Value Ref Range   Color, UA yellow    Clarity, UA clear    Glucose, UA neg    Bilirubin, UA neg    Ketones, UA neg    Spec Grav, UA 1.010 1.010 - 1.025   Blood, UA neg    pH, UA 8.5 (A) 5.0 - 8.0   Protein, UA neg    Urobilinogen, UA negative (A) 0.2 or 1.0 E.U./dL   Nitrite, UA negative    Leukocytes, UA Negative Negative      PHQ2/9: Depression screen [facility] 2/9 06/26/2016 11/09/2015 09/18/2015 06/21/2015 05/10/2015  Decreased Interest 0 0 0 0 0  Down,  Depressed, Hopeless 0 0 0 0 0  PHQ - 2 Score 0 0 0 0 0     Fall Risk: Fall Risk  06/26/2016 11/09/2015 09/18/2015 06/21/2015 05/10/2015  Falls in the past year? No No No No No    Assessment & Plan  1. Urinary frequency  - POCT Urinalysis Dipstick - Urine Culture - ciprofloxacin (CIPRO) 250 MG tablet; Take 1 tablet (250 mg total) by mouth 2 (two) times daily.  Dispense: 6 tablet; Refill: 0  2. Flu vaccine need  Flu shot  3. Vaginitis and  vulvovaginitis  - fluconazole (DIFLUCAN) 150 MG tablet; Take 1 tablet (150 mg total) by mouth every other day.  Dispense: 2 tablet; Refill: 0  4. Blurred vision  - POCT HgB A1C 5.3%

## 2017-01-29 ENCOUNTER — Encounter: Payer: Self-pay | Admitting: Family Medicine

## 2017-01-29 LAB — URINE CULTURE
MICRO NUMBER:: 81067181
RESULT: NO GROWTH
SPECIMEN QUALITY:: ADEQUATE

## 2017-02-24 ENCOUNTER — Telehealth: Payer: Self-pay | Admitting: Obstetrics and Gynecology

## 2017-02-26 ENCOUNTER — Encounter: Payer: Self-pay | Admitting: Obstetrics and Gynecology

## 2017-02-26 NOTE — Progress Notes (Signed)
ANNUAL PREVENTATIVE CARE GYN  ENCOUNTER NOTE  Subjective:       Patricia Horn is a 26 y.o. G0P0000 female here for a routine annual gynecologic exam.  Current complaints: 1. none  Amenorrhea on IUD. No pelvic pain. Not sexually active. Bowel and bladder function are normal Past history of severe dysmenorrhea. Past history of uterine leiomyoma requiring laparoscopic myomectomy-posterior fibroid was a pedunculated fibroid; anterior fibroid was subserosal. Last pelvic ultrasound demonstrated a 0.5 cm intramural fibroid.   Gynecologic History No LMP recorded. Patient is not currently having periods (Reason: IUD). Contraception: IUD Last Pap: 02/27/2016 neg. Results were: normal Last mammogram: n/a. Results were:n/a  Obstetric History OB History  Gravida Para Term Preterm AB Living  0 0 0 0 0 0  SAB TAB Ectopic Multiple Live Births  0 0 0 0        Obstetric Comments  1st Menstrual Cycle:  8    Past Medical History:  Diagnosis Date  . Anxiety   . Depression   . Environmental allergies   . Tendinitis   . Umbilical hernia     Past Surgical History:  Procedure Laterality Date  . HERNIA REPAIR  7026   umbilical   . MYOMECTOMY  2015    Current Outpatient Prescriptions on File Prior to Visit  Medication Sig Dispense Refill  . aluminum chloride (DRYSOL) 20 % external solution Apply topically at bedtime. 35 mL 3  . ciprofloxacin (CIPRO) 250 MG tablet Take 1 tablet (250 mg total) by mouth 2 (two) times daily. 6 tablet 0  . fluconazole (DIFLUCAN) 150 MG tablet Take 1 tablet (150 mg total) by mouth every other day. 2 tablet 0  . fluticasone (FLONASE) 50 MCG/ACT nasal spray Place 2 sprays into both nostrils daily. 16 g 11  . levonorgestrel (MIRENA) 20 MCG/24HR IUD 1 Intra Uterine Device (1 each total) by Intrauterine route once. 1 each 0  . loratadine (CLARITIN) 10 MG tablet Take 1 tablet (10 mg total) by mouth daily. 30 tablet 5   No current facility-administered medications  on file prior to visit.     No Known Allergies  Social History   Social History  . Marital status: Single    Spouse name: N/A  . Number of children: N/A  . Years of education: N/A   Occupational History  . Not on file.   Social History Main Topics  . Smoking status: Never Smoker  . Smokeless tobacco: Never Used  . Alcohol use 0.6 oz/week    1 Standard drinks or equivalent per week     Comment: ocassional  . Drug use: No  . Sexual activity: Not Currently    Partners: Male    Birth control/ protection: IUD   Other Topics Concern  . Not on file   Social History Narrative  . No narrative on file    Family History  Problem Relation Age of Onset  . Hypertension Mother   . Scleroderma Mother   . Pulmonary fibrosis Mother   . Asthma Mother   . Cancer Neg Hx     The following portions of the patient's history were reviewed and updated as appropriate: allergies, current medications, past family history, past medical history, past social history, past surgical history and problem list.  Review of Systems Review of Systems  Genitourinary:       Amenorrhea with IUD  All other systems reviewed and are negative.    Objective:   BP 103/66   Pulse 80  Ht 5\' 2"  (1.575 m)   Wt 175 lb 6.4 oz (79.6 kg)   LMP  (LMP Unknown)   BMI 32.08 kg/m  CONSTITUTIONAL: Well-developed, well-nourished female in no acute distress.  PSYCHIATRIC: Normal mood and affect. Normal behavior. Normal judgment and thought content. [place]: Alert and oriented to person, place, and time. Normal muscle tone coordination. No cranial nerve deficit noted. HENT:  Normocephalic, atraumatic, External right and left ear normal. Oropharynx is clear and moist EYES: Conjunctivae and EOM are normal. . No scleral icterus.  NECK: Normal range of motion, supple, no masses.  Normal thyroid.  SKIN: Skin is warm and dry. No rash noted. Not diaphoretic. No erythema. No pallor. CARDIOVASCULAR: Normal heart rate  noted, regular rhythm, no murmur. RESPIRATORY: Clear to auscultation bilaterally. Effort and breath sounds normal, no problems with respiration noted. BREASTS: Symmetric in size. No masses, skin changes, nipple drainage, or lymphadenopathy. ABDOMEN: Soft, normal bowel sounds, no distention noted.  No tenderness, rebound or guarding.  BLADDER: Normal PELVIC:  External Genitalia: Normal  BUS: Normal  Vagina: Normal  Cervix: Normal; IUD strings-0.5 cm  Uterus: Normal; midplane to retroverted, normal size and shape, mobile, 1/4 tender  Adnexa: Normal; nonpalpable and nontender  RV: External Exam NormaI  MUSCULOSKELETAL: Normal range of motion. No tenderness.  No cyanosis, clubbing, or edema.  2+ distal pulses. LYMPHATIC: No Axillary, Supraclavicular, or Inguinal Adenopathy.    Assessment:   Annual gynecologic examination 26 y.o. Contraception: IUD Mirena [id number] Problem List Items Addressed This Visit    None    Visit Diagnoses    Well woman exam with routine gynecological exam    -  Primary   Increased body mass index       Uterine leiomyoma, unspecified location       Contraception, device intrauterine         With past history of fibroids, status post laparoscopic myomectomy x2 Past history of severe dysmenorrhea, asymptomatic with IUD in place at this time Plan:  Pap: Pap, Reflex if ASCUS Mammogram: not indicated Stool Guaiac Testing:  Not Indicated Labs: thru pcp Routine preventative health maintenance measures emphasized: Exercise/Diet/Weight control, Tobacco Warnings, Alcohol/Substance use risks and Safe Sex Return to Clinic - [place], CMA  Brayton Mars, MD  Note: This dictation was prepared with Dragon dictation along with smaller phrase technology. Any transcriptional errors that result from this process are unintentional.

## 2017-03-03 ENCOUNTER — Ambulatory Visit (INDEPENDENT_AMBULATORY_CARE_PROVIDER_SITE_OTHER): Payer: 59 | Admitting: Obstetrics and Gynecology

## 2017-03-03 ENCOUNTER — Encounter: Payer: Self-pay | Admitting: Obstetrics and Gynecology

## 2017-03-03 VITALS — BP 103/66 | HR 80 | Ht 62.0 in | Wt 175.4 lb

## 2017-03-03 DIAGNOSIS — D259 Leiomyoma of uterus, unspecified: Secondary | ICD-10-CM

## 2017-03-03 DIAGNOSIS — Z975 Presence of (intrauterine) contraceptive device: Secondary | ICD-10-CM | POA: Diagnosis not present

## 2017-03-03 DIAGNOSIS — Z86018 Personal history of other benign neoplasm: Secondary | ICD-10-CM | POA: Diagnosis not present

## 2017-03-03 DIAGNOSIS — Z8742 Personal history of other diseases of the female genital tract: Secondary | ICD-10-CM | POA: Diagnosis not present

## 2017-03-03 DIAGNOSIS — R638 Other symptoms and signs concerning food and fluid intake: Secondary | ICD-10-CM

## 2017-03-03 DIAGNOSIS — Z01419 Encounter for gynecological examination (general) (routine) without abnormal findings: Secondary | ICD-10-CM

## 2017-03-03 NOTE — Patient Instructions (Signed)
1.  Pap smear is done 2.  Self breast awareness is encouraged 3.  Screening labs are to be obtained through primary care 4.  Continue with healthy eating, exercise, and control weight loss 5.  Contraception-IUD 6.  Safe sex practices are encouraged 7.  Return in 1 year for annual exam  Health Maintenance, Female Adopting a healthy lifestyle and getting preventive care can go a long way to promote health and wellness. Talk with your health care provider about what schedule of regular examinations is right for you. This is a good chance for you to check in with your provider about disease prevention and staying healthy. In between checkups, there are plenty of things you can do on your own. Experts have done a lot of research about which lifestyle changes and preventive measures are most likely to keep you healthy. Ask your health care provider for more information. Weight and diet Eat a healthy diet  Be sure to include plenty of vegetables, fruits, low-fat dairy products, and lean protein.  Do not eat a lot of foods high in solid fats, added sugars, or salt.  Get regular exercise. This is one of the most important things you can do for your health. ? Most adults should exercise for at least 150 minutes each week. The exercise should increase your heart rate and make you sweat (moderate-intensity exercise). ? Most adults should also do strengthening exercises at least twice a week. This is in addition to the moderate-intensity exercise.  Maintain a healthy weight  Body mass index (BMI) is a measurement that can be used to identify possible weight problems. It estimates body fat based on height and weight. Your health care provider can help determine your BMI and help you achieve or maintain a healthy weight.  For females 9 years of age and older: ? A BMI below 18.5 is considered underweight. ? A BMI of 18.5 to 24.9 is normal. ? A BMI of 25 to 29.9 is considered overweight. ? A BMI of 30  and above is considered obese.  Watch levels of cholesterol and blood lipids  You should start having your blood tested for lipids and cholesterol at 28 years of age, then have this test every 5 years.  You may need to have your cholesterol levels checked more often if: ? Your lipid or cholesterol levels are high. ? You are older than 28 years of age. ? You are at high risk for heart disease.  Cancer screening Lung Cancer  Lung cancer screening is recommended for adults 47-36 years old who are at high risk for lung cancer because of a history of smoking.  A yearly low-dose CT scan of the lungs is recommended for people who: ? Currently smoke. ? Have quit within the past 15 years. ? Have at least a 30-pack-year history of smoking. A pack year is smoking an average of one pack of cigarettes a day for 1 year.  Yearly screening should continue until it has been 15 years since you quit.  Yearly screening should stop if you develop a health problem that would prevent you from having lung cancer treatment.  Breast Cancer  Practice breast self-awareness. This means understanding how your breasts normally appear and feel.  It also means doing regular breast self-exams. Let your health care provider know about any changes, no matter how small.  If you are in your 20s or 30s, you should have a clinical breast exam (CBE) by a health care provider every 1-3  years as part of a regular health exam.  If you are 27 or older, have a CBE every year. Also consider having a breast X-ray (mammogram) every year.  If you have a family history of breast cancer, talk to your health care provider about genetic screening.  If you are at high risk for breast cancer, talk to your health care provider about having an MRI and a mammogram every year.  Breast cancer gene (BRCA) assessment is recommended for women who have family members with BRCA-related cancers. BRCA-related cancers  include: ? Breast. ? Ovarian. ? Tubal. ? Peritoneal cancers.  Results of the assessment will determine the need for genetic counseling and BRCA1 and BRCA2 testing.  Cervical Cancer Your health care provider may recommend that you be screened regularly for cancer of the pelvic organs (ovaries, uterus, and vagina). This screening involves a pelvic examination, including checking for microscopic changes to the surface of your cervix (Pap test). You may be encouraged to have this screening done every 3 years, beginning at age 57.  For women ages 42-65, health care providers may recommend pelvic exams and Pap testing every 3 years, or they may recommend the Pap and pelvic exam, combined with testing for human papilloma virus (HPV), every 5 years. Some types of HPV increase your risk of cervical cancer. Testing for HPV may also be done on women of any age with unclear Pap test results.  Other health care providers may not recommend any screening for nonpregnant women who are considered low risk for pelvic cancer and who do not have symptoms. Ask your health care provider if a screening pelvic exam is right for you.  If you have had past treatment for cervical cancer or a condition that could lead to cancer, you need Pap tests and screening for cancer for at least 20 years after your treatment. If Pap tests have been discontinued, your risk factors (such as having a new sexual partner) need to be reassessed to determine if screening should resume. Some women have medical problems that increase the chance of getting cervical cancer. In these cases, your health care provider may recommend more frequent screening and Pap tests.  Colorectal Cancer  This type of cancer can be detected and often prevented.  Routine colorectal cancer screening usually begins at 28 years of age and continues through 28 years of age.  Your health care provider may recommend screening at an earlier age if you have risk factors  for colon cancer.  Your health care provider may also recommend using home test kits to check for hidden blood in the stool.  A small camera at the end of a tube can be used to examine your colon directly (sigmoidoscopy or colonoscopy). This is done to check for the earliest forms of colorectal cancer.  Routine screening usually begins at age 81.  Direct examination of the colon should be repeated every 5-10 years through 28 years of age. However, you may need to be screened more often if early forms of precancerous polyps or small growths are found.  Skin Cancer  Check your skin from head to toe regularly.  Tell your health care provider about any new moles or changes in moles, especially if there is a change in a mole's shape or color.  Also tell your health care provider if you have a mole that is larger than the size of a pencil eraser.  Always use sunscreen. Apply sunscreen liberally and repeatedly throughout the day.  Protect yourself  by wearing long sleeves, pants, a wide-brimmed hat, and sunglasses whenever you are outside.  Heart disease, diabetes, and high blood pressure  High blood pressure causes heart disease and increases the risk of stroke. High blood pressure is more likely to develop in: ? People who have blood pressure in the high end of the normal range (130-139/85-89 mm Hg). ? People who are overweight or obese. ? People who are African American.  If you are 18-39 years of age, have your blood pressure checked every 3-5 years. If you are 40 years of age or older, have your blood pressure checked every year. You should have your blood pressure measured twice-once when you are at a hospital or clinic, and once when you are not at a hospital or clinic. Record the average of the two measurements. To check your blood pressure when you are not at a hospital or clinic, you can use: ? An automated blood pressure machine at a pharmacy. ? A home blood pressure monitor.  If  you are between 55 years and 79 years old, ask your health care provider if you should take aspirin to prevent strokes.  Have regular diabetes screenings. This involves taking a blood sample to check your fasting blood sugar level. ? If you are at a normal weight and have a low risk for diabetes, have this test once every three years after 28 years of age. ? If you are overweight and have a high risk for diabetes, consider being tested at a younger age or more often. Preventing infection Hepatitis B  If you have a higher risk for hepatitis B, you should be screened for this virus. You are considered at high risk for hepatitis B if: ? You were born in a country where hepatitis B is common. Ask your health care provider which countries are considered high risk. ? Your parents were born in a high-risk country, and you have not been immunized against hepatitis B (hepatitis B vaccine). ? You have HIV or AIDS. ? You use needles to inject street drugs. ? You live with someone who has hepatitis B. ? You have had sex with someone who has hepatitis B. ? You get hemodialysis treatment. ? You take certain medicines for conditions, including cancer, organ transplantation, and autoimmune conditions.  Hepatitis C  Blood testing is recommended for: ? Everyone born from 1945 through 1965. ? Anyone with known risk factors for hepatitis C.  Sexually transmitted infections (STIs)  You should be screened for sexually transmitted infections (STIs) including gonorrhea and chlamydia if: ? You are sexually active and are younger than 28 years of age. ? You are older than 28 years of age and your health care provider tells you that you are at risk for this type of infection. ? Your sexual activity has changed since you were last screened and you are at an increased risk for chlamydia or gonorrhea. Ask your health care provider if you are at risk.  If you do not have HIV, but are at risk, it may be recommended  that you take a prescription medicine daily to prevent HIV infection. This is called pre-exposure prophylaxis (PrEP). You are considered at risk if: ? You are sexually active and do not regularly use condoms or know the HIV status of your partner(s). ? You take drugs by injection. ? You are sexually active with a partner who has HIV.  Talk with your health care provider about whether you are at high risk of being infected   with HIV. If you choose to begin PrEP, you should first be tested for HIV. You should then be tested every 3 months for as long as you are taking PrEP. Pregnancy  If you are premenopausal and you may become pregnant, ask your health care provider about preconception counseling.  If you may become pregnant, take 400 to 800 micrograms (mcg) of folic acid every day.  If you want to prevent pregnancy, talk to your health care provider about birth control (contraception). Osteoporosis and menopause  Osteoporosis is a disease in which the bones lose minerals and strength with aging. This can result in serious bone fractures. Your risk for osteoporosis can be identified using a bone density scan.  If you are 65 years of age or older, or if you are at risk for osteoporosis and fractures, ask your health care provider if you should be screened.  Ask your health care provider whether you should take a calcium or vitamin D supplement to lower your risk for osteoporosis.  Menopause may have certain physical symptoms and risks.  Hormone replacement therapy may reduce some of these symptoms and risks. Talk to your health care provider about whether hormone replacement therapy is right for you. Follow these instructions at home:  Schedule regular health, dental, and eye exams.  Stay current with your immunizations.  Do not use any tobacco products including cigarettes, chewing tobacco, or electronic cigarettes.  If you are pregnant, do not drink alcohol.  If you are  breastfeeding, limit how much and how often you drink alcohol.  Limit alcohol intake to no more than 1 drink per day for nonpregnant women. One drink equals 12 ounces of beer, 5 ounces of wine, or 1 ounces of hard liquor.  Do not use street drugs.  Do not share needles.  Ask your health care provider for help if you need support or information about quitting drugs.  Tell your health care provider if you often feel depressed.  Tell your health care provider if you have ever been abused or do not feel safe at home. This information is not intended to replace advice given to you by your health care provider. Make sure you discuss any questions you have with your health care provider. Document Released: 11/04/2010 Document Revised: 09/27/2015 Document Reviewed: 01/23/2015 Elsevier Interactive Patient Education  2018 [employer]  

## 2017-03-04 LAB — PAP IG W/ RFLX HPV ASCU: PAP Smear Comment: 0

## 2017-03-12 ENCOUNTER — Encounter: Payer: Self-pay | Admitting: Family Medicine

## 2017-03-31 DIAGNOSIS — G5601 Carpal tunnel syndrome, right upper limb: Secondary | ICD-10-CM | POA: Diagnosis not present

## 2017-04-08 DIAGNOSIS — G5601 Carpal tunnel syndrome, right upper limb: Secondary | ICD-10-CM | POA: Diagnosis not present

## 2017-04-09 NOTE — Telephone Encounter (Signed)
error 

## 2017-04-14 DIAGNOSIS — G5601 Carpal tunnel syndrome, right upper limb: Secondary | ICD-10-CM | POA: Diagnosis not present

## 2017-04-20 ENCOUNTER — Encounter: Payer: Self-pay | Admitting: Family Medicine

## 2017-06-26 ENCOUNTER — Encounter: Payer: Self-pay | Admitting: Family Medicine

## 2017-06-26 ENCOUNTER — Ambulatory Visit: Payer: 59 | Admitting: Family Medicine

## 2017-06-26 VITALS — BP 118/72 | HR 96 | Temp 98.2°F | Resp 16 | Ht 62.0 in | Wt 171.2 lb

## 2017-06-26 DIAGNOSIS — J069 Acute upper respiratory infection, unspecified: Secondary | ICD-10-CM

## 2017-06-26 DIAGNOSIS — J3089 Other allergic rhinitis: Secondary | ICD-10-CM

## 2017-06-26 MED ORDER — AZELASTINE-FLUTICASONE 137-50 MCG/ACT NA SUSP: 2.0000 | Freq: Every day | NASAL | 11 refills | Status: DC

## 2017-06-26 MED ORDER — LEVOCETIRIZINE DIHYDROCHLORIDE 5 MG PO TABS: 5.0000 mg | ORAL_TABLET | Freq: Every evening | ORAL | 1 refills | Status: DC

## 2017-06-26 NOTE — Patient Instructions (Signed)
Upper Respiratory Infection, Adult Most upper respiratory infections (URIs) are caused by a virus. A URI affects the nose, throat, and upper air passages. The most common type of URI is often called "the common cold." Follow these instructions at home:  Take medicines only as told by your doctor.  Gargle warm saltwater or take cough drops to comfort your throat as told by your doctor.  Use a warm mist humidifier or inhale steam from a shower to increase air moisture. This may make it easier to breathe.  Drink enough fluid to keep your pee (urine) clear or pale yellow.  Eat soups and other clear broths.  Have a healthy diet.  Rest as needed.  Go back to work when your fever is gone or your doctor says it is okay. ? You may need to stay home longer to avoid giving your URI to others. ? You can also wear a face mask and wash your hands often to prevent spread of the virus.  Use your inhaler more if you have asthma.  Do not use any tobacco products, including cigarettes, chewing tobacco, or electronic cigarettes. If you need help quitting, ask your doctor. Contact a doctor if:  You are getting worse, not better.  Your symptoms are not helped by medicine.  You have chills.  You are getting more short of breath.  You have brown or red mucus.  You have yellow or brown discharge from your nose.  You have pain in your face, especially when you bend forward.  You have a fever.  You have puffy (swollen) neck glands.  You have pain while swallowing.  You have white areas in the back of your throat. Get help right away if:  You have very bad or constant: ? Headache. ? Ear pain. ? Pain in your forehead, behind your eyes, and over your cheekbones (sinus pain). ? Chest pain.  You have long-lasting (chronic) lung disease and any of the following: ? Wheezing. ? Long-lasting cough. ? Coughing up blood. ? A change in your usual mucus.  You have a stiff neck.  You have  changes in your: ? Vision. ? Hearing. ? Thinking. ? Mood. This information is not intended to replace advice given to you by your health care provider. Make sure you discuss any questions you have with your health care provider. Document Released: 10/08/2007 Document Revised: 12/23/2015 Document Reviewed: 07/27/2013 Elsevier Interactive Patient Education  2018 [employer]  Cool Mist Vaporizer A cool mist vaporizer is a device that releases a cool mist into the air. If you have a cough or a cold, using a vaporizer may help relieve your symptoms. The mist adds moisture to the air, which may help thin your mucus and make it less sticky. When your mucus is thin and less sticky, it easier for you to breathe and to cough up secretions. Do not use a vaporizer if you are allergic to mold. Follow these instructions at home:  Follow the instructions that come with the vaporizer.  Do not use anything other than distilled water in the vaporizer.  Do not run the vaporizer all of the time. Doing that can cause mold or bacteria to grow in the vaporizer.  Clean the vaporizer after each time that you use it.  Clean and dry the vaporizer well before storing it.  Stop using the vaporizer if your breathing symptoms get worse. This information is not intended to replace advice given to you by your health care provider. Make sure   you discuss any questions you have with your health care provider. Document Released: 01/17/2004 Document Revised: 11/09/2015 Document Reviewed: 07/21/2015 Elsevier Interactive Patient Education  2018 [employer] Inc[employer]   

## 2017-06-26 NOTE — Progress Notes (Signed)
Name: Patricia Horn   MRN: [id number]    DOB: 11/18/1990   Date:06/26/2017       Progress Note  Subjective  Chief Complaint  Chief Complaint  Patient presents with  . URI    nasal drainage, hoarness, chest congestion for 6 days  . Otalgia    HPI  Pt presents with concern for about 6 days of nasal congestion, chest congestion, hoarse voice, post-nasal drip, bilateral ear pressure without pain.  Denies sore throat, cough, NVD, chest pain, shortness of breath, fevers/chills, fatigue.  She has been using her flonase and claritin daily without any relief of symptoms.  Her mother had double lung transplant a year ago and is immunocompromised, so patient wants to ensure that she is being careful around her mother.  Patient Active Problem List   Diagnosis Date Noted  . Right carpal tunnel syndrome 11/10/2016  . Urticaria 11/09/2015  . 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 Obese 01/05/2015  . Umbilical cyst 67/34/1937  . Perennial allergic rhinitis 11/16/2014  . Anxiety, generalized 10/06/2014  . ANA positive 10/06/2014  . History of dysmenorrhea 10/06/2014    Social History   Tobacco Use  . Smoking status: Never Smoker  . Smokeless tobacco: Never Used  Substance Use Topics  . Alcohol use: Yes    Alcohol/week: 0.6 oz    Types: 1 Standard drinks or equivalent per week    Comment: ocassional     Current Outpatient Medications:  .  levonorgestrel (MIRENA) 20 MCG/24HR IUD, 1 Intra Uterine Device (1 each total) by Intrauterine route once., Disp: 1 each, Rfl: 0 .  aluminum chloride (DRYSOL) 20 % external solution, Apply topically at bedtime. (Patient not taking: Reported on 06/26/2017), Disp: 35 mL, Rfl: 3 .  fluticasone (FLONASE) 50 MCG/ACT nasal spray, Place 2 sprays into both nostrils daily. (Patient not taking: Reported on 06/26/2017), Disp: 16 g, Rfl: 11 .  loratadine (CLARITIN) 10 MG tablet, Take 1 tablet (10 mg total) by mouth daily. (Patient not taking: Reported on 06/26/2017),  Disp: 30 tablet, Rfl: 5  No Known Allergies  ROS  Constitutional: Negative for fever or weight change.  Respiratory: Negative for cough and shortness of breath.   Cardiovascular: Negative for chest pain or palpitations.  Gastrointestinal: Negative for abdominal pain, no bowel changes.  Musculoskeletal: Negative for gait problem or joint swelling.  Skin: Negative for rash.  Neurological: Negative for dizziness or headache.  No other specific complaints in a complete review of systems (except as listed in HPI above).  Objective  Vitals:   06/26/17 1059  BP: 118/72  Pulse: 96  Resp: 16  Temp: 98.2 F (36.8 C)  TempSrc: Oral  SpO2: 98%  Weight: 171 lb 3.2 oz (77.7 kg)  Height: 5\' 2"  (1.575 m)   Body mass index is 31.31 kg/m.  Nursing Note and Vital Signs reviewed.  Physical Exam  Constitutional: Patient appears well-developed and well-nourished.  No distress.  HEENT: head atraumatic, normocephalic, pupils equal and reactive to light, EOM's intact, TM's without erythema or bulging, no maxillary or frontal sinus tenderness , neck supple without lymphadenopathy, oropharynx moderately erythematous and moist without exudate Cardiovascular: Normal rate, regular rhythm, S1/S2 present.  No murmur or rub heard. No BLE edema. Pulmonary/Chest: Effort normal and breath sounds clear. No respiratory distress or retractions. Psychiatric: Patient has a normal mood and affect. behavior is normal. Judgment and thought content normal.  No results found for this or any previous visit (from the past 72 hour(s)).  Assessment &  Plan  1. Upper respiratory tract infection, unspecified type - Azelastine-Fluticasone 137-50 MCG/ACT SUSP; Place 2 sprays into the nose daily.  Dispense: 23 g; Refill: 11 - levocetirizine (XYZAL) 5 MG tablet; Take 1 tablet (5 mg total) by mouth every evening.  Dispense: 90 tablet; Refill: 1  2. Perennial allergic rhinitis - Azelastine-Fluticasone 137-50 MCG/ACT SUSP;  Place 2 sprays into the nose daily.  Dispense: 23 g; Refill: 11 - levocetirizine (XYZAL) 5 MG tablet; Take 1 tablet (5 mg total) by mouth every evening.  Dispense: 90 tablet; Refill: 1  -Red flags and when to present for emergency care or RTC including fever >101.26F, chest pain, shortness of breath, new/worsening/un-resolving symptoms, reviewed with patient at time of visit. Follow up and care instructions discussed and provided in AVS.

## 2017-06-29 ENCOUNTER — Encounter: Payer: Self-pay | Admitting: Family Medicine

## 2017-06-29 MED ORDER — AZELASTINE HCL 137 MCG/SPRAY NA SOLN: 2.0000 | Freq: Every day | NASAL | 0 refills | Status: DC

## 2017-06-29 MED ORDER — FLUTICASONE PROPIONATE 50 MCG/ACT NA SUSP: 2.0000 | Freq: Every day | NASAL | 1 refills | Status: DC

## 2017-07-06 DIAGNOSIS — J301 Allergic rhinitis due to pollen: Secondary | ICD-10-CM | POA: Diagnosis not present

## 2017-07-06 DIAGNOSIS — L508 Other urticaria: Secondary | ICD-10-CM | POA: Diagnosis not present

## 2017-07-22 DIAGNOSIS — J301 Allergic rhinitis due to pollen: Secondary | ICD-10-CM | POA: Diagnosis not present

## 2017-08-25 ENCOUNTER — Encounter: Payer: Self-pay | Admitting: Obstetrics and Gynecology

## 2017-12-07 ENCOUNTER — Encounter: Payer: Self-pay | Admitting: Family Medicine

## 2017-12-07 ENCOUNTER — Ambulatory Visit: Payer: 59 | Admitting: Family Medicine

## 2017-12-07 ENCOUNTER — Other Ambulatory Visit (HOSPITAL_COMMUNITY)
Admission: RE | Admit: 2017-12-07 | Discharge: 2017-12-07 | Disposition: A | Discharge status: Home / Self Care | Payer: 59 | Source: Ambulatory Visit | Attending: Family Medicine | Admitting: Family Medicine

## 2017-12-07 VITALS — BP 116/82 | HR 81 | Temp 99.0°F | Resp 16 | Ht 62.0 in | Wt 168.4 lb

## 2017-12-07 DIAGNOSIS — R202 Paresthesia of skin: Secondary | ICD-10-CM

## 2017-12-07 DIAGNOSIS — R5383 Other fatigue: Secondary | ICD-10-CM | POA: Diagnosis not present

## 2017-12-07 DIAGNOSIS — M25541 Pain in joints of right hand: Secondary | ICD-10-CM

## 2017-12-07 DIAGNOSIS — Z87898 Personal history of other specified conditions: Secondary | ICD-10-CM

## 2017-12-07 DIAGNOSIS — Z113 Encounter for screening for infections with a predominantly sexual mode of transmission: Secondary | ICD-10-CM

## 2017-12-07 DIAGNOSIS — F411 Generalized anxiety disorder: Secondary | ICD-10-CM

## 2017-12-07 DIAGNOSIS — Z131 Encounter for screening for diabetes mellitus: Secondary | ICD-10-CM

## 2017-12-07 DIAGNOSIS — J3089 Other allergic rhinitis: Secondary | ICD-10-CM | POA: Diagnosis not present

## 2017-12-07 DIAGNOSIS — J069 Acute upper respiratory infection, unspecified: Secondary | ICD-10-CM

## 2017-12-07 DIAGNOSIS — M7989 Other specified soft tissue disorders: Secondary | ICD-10-CM

## 2017-12-07 DIAGNOSIS — M25542 Pain in joints of left hand: Secondary | ICD-10-CM

## 2017-12-07 DIAGNOSIS — Z13220 Encounter for screening for lipoid disorders: Secondary | ICD-10-CM

## 2017-12-07 DIAGNOSIS — Z6830 Body mass index (BMI) 30.0-30.9, adult: Secondary | ICD-10-CM

## 2017-12-07 MED ORDER — LEVOCETIRIZINE DIHYDROCHLORIDE 5 MG PO TABS: 5.0000 mg | ORAL_TABLET | Freq: Every evening | ORAL | 1 refills | Status: DC

## 2017-12-07 MED ORDER — AZELASTINE HCL 137 MCG/SPRAY NA SOLN: 2.0000 | Freq: Every day | NASAL | 1 refills | Status: DC

## 2017-12-07 NOTE — Progress Notes (Addendum)
Name: Patricia Horn   MRN: [id number]    DOB: [birth year]   Date:12/07/2017       Progress Note  Subjective  Chief Complaint  Chief Complaint  Patient presents with  . Hand Pain    Onset- couple of years, intermittently-worst in the summer time-has seen a hand specialist at [facility]. Patient was recommended to come back to PCP and get blood work. Patient states bilateral hands will lock up, right hand will swell and numbing pain-weak.     HPI  Hand problems: she states she is seeing Ortho at [facility], Dr. Peggye Ley and she advised her to come in to have labs done. She had three NCS that have been normal, but continues to have paresthesia, pain and also now having swelling of right hand, sometimes pain on right side radiates to right elbow. No redness or increase in warmth, no change in color. Cold makes symptoms worse. Symptoms going on for years. Previous history of of positive ANA. She has also been feeling more tired than usual. She also had three cortisone injections on wrist right side and right thumb with temporary improvement of symptoms.   She would like to have other labs done also, since not seen in a long time.   AR: seen allergist, she has a lot of environmental allergies but doing well on current regiment She denies current rhinorrhea, nasal congestion or sneezing.   Obesity: she is physically active , goes to spin classes. Trying to eat healthy.    Patient Active Problem List   Diagnosis Date Noted  . Right carpal tunnel syndrome 11/10/2016  . Urticaria 11/09/2015  . 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 Obese 01/05/2015  . Umbilical cyst 92/42/6834  . Perennial allergic rhinitis 11/16/2014  . Anxiety, generalized 10/06/2014  . ANA positive 10/06/2014  . History of dysmenorrhea 10/06/2014    Past Surgical History:  Procedure Laterality Date  . HERNIA REPAIR  1962   umbilical   . MYOMECTOMY  2015    Family History  Problem Relation Age of Onset  . Hypertension  Mother   . Scleroderma Mother   . Pulmonary fibrosis Mother   . Asthma Mother   . Cancer Neg Hx     Social History   Socioeconomic History  . Marital status: Single    Spouse name: Not on file  . Number of children: 0  . Years of education: Not on file  . Highest education level: Master's degree (e.g., MA, MS, MEng, MEd, MSW, MBA)  Occupational History  . 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 Financial resource strain: Not very hard  . Food insecurity:    Worry: Never true    Inability: Never true  . Transportation needs:    Medical: No    Non-medical: No  Tobacco Use  . Smoking status: Never Smoker  . Smokeless tobacco: Never Used  Substance and Sexual Activity  . Alcohol use: Yes    Alcohol/week: 0.6 oz    Types: 1 Standard drinks or equivalent per week    Comment: ocassional  . Drug use: No  . Sexual activity: Not Currently    Partners: Male    Birth control/protection: IUD  Lifestyle  . Physical activity:    Days per week: 3 days    Minutes per session: 60 min  . Stress: Not at all  Relationships  . Social connections:    Talks on phone: Not  on file    Gets together: Not on file    Attends religious service: Not on file    Active member of club or organization: Not on file    Attends meetings of clubs or organizations: Not on file    Relationship status: Not on file  . Intimate partner violence:    Fear of current or ex partner: Not on file    Emotionally abused: Not on file    Physically abused: Not on file    Forced sexual activity: Not on file  Other Topics Concern  . 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      Current Outpatient Medications:  .  aluminum chloride (DRYSOL) 20 % external solution, Apply topically at bedtime., Disp: 35 mL, Rfl: 3 .   Azelastine HCl 137 MCG/SPRAY SOLN, Place 2 sprays into the nose daily., Disp: 90 mL, Rfl: 1 .  fluticasone (FLONASE) 50 MCG/ACT nasal spray, Place 2 sprays into both nostrils daily., Disp: 16 g, Rfl: 1 .  levocetirizine (XYZAL) 5 MG tablet, Take 1 tablet (5 mg total) by mouth every evening., Disp: 90 tablet, Rfl: 1 .  levonorgestrel (MIRENA) 20 MCG/24HR IUD, 1 Intra Uterine Device (1 each total) by Intrauterine route once., Disp: 1 each, Rfl: 0  No Known Allergies   ROS  Constitutional: Negative for fever or weight change.  Respiratory: Negative for cough and shortness of breath.   Cardiovascular: Negative for chest pain or palpitations.  Gastrointestinal: Negative for abdominal pain, no bowel changes.  Musculoskeletal: Negative for gait problem point for joint swelling.  Skin: Negative for rash.  Neurological: Negative for dizziness or headache.  No other specific complaints in a complete review of systems (except as listed in HPI above).  Objective  Vitals:   12/07/17 1346  BP: 116/82  Pulse: 81  Resp: 16  Temp: 99 F (37.2 C)  TempSrc: Oral  SpO2: 96%  Weight: 168 lb 6.4 oz (76.4 kg)  Height: 5\' 2"  (1.575 m)    Body mass index is 30.8 kg/m.  Physical Exam  Constitutional: Patient appears well-developed and well-nourished. ObeseNo distress.  HEENT: head atraumatic, normocephalic, pupils equal and reactive to light,  neck supple, throat within normal limits Cardiovascular: Normal rate, regular rhythm and normal heart sounds.  No murmur heard. No BLE edema. Pulmonary/Chest: Effort normal and breath sounds normal. No respiratory distress. Abdominal: Soft.  There is no tenderness. Psychiatric: Patient has a normal mood and affect. behavior is normal. Judgment and thought content normal. Muscular skeletal: no synovitis, redness or increase in warmth of hands, pain during flexion of right thumb with ulnar extension of right wrist,   PHQ2/9: Depression screen [facility] 2/9  12/07/2017 06/26/2016 11/09/2015 09/18/2015 06/21/2015  Decreased Interest 0 0 0 0 0  Down, Depressed, Hopeless 0 0 0 0 0  PHQ - 2 Score 0 0 0 0 0     Fall Risk: Fall Risk  12/07/2017 06/26/2016 11/09/2015 09/18/2015 06/21/2015  Falls in the past year? No No No No No     Functional Status Survey: Is the patient deaf or have difficulty hearing?: No Does the patient have difficulty seeing, even when wearing glasses/contacts?: No Does the patient have difficulty concentrating, remembering, or making decisions?: No Does the patient have difficulty walking or climbing stairs?: No Does the patient have difficulty dressing  or bathing?: No Does the patient have difficulty doing errands alone such as visiting a doctor's office or shopping?: No    Assessment & Plan  1. Perennial allergic rhinitis  Taking medication , she had allergy testing done last year   2. History of elevated antinuclear antibody (ANA)  Repeat levels  3. Paresthesia of both hands  - ANA,IFA RA Diag Pnl w/rflx Tit/Patn  4. Arthralgia of both hands  - ANA,IFA RA Diag Pnl w/rflx Tit/Patn  5. Swelling of right hand  - ANA,IFA RA Diag Pnl w/rflx Tit/Patn - Uric acid  6. Other fatigue  - CBC with Differential/Platelet - COMPLETE METABOLIC PANEL WITH GFR  7. BMI 30.0-30.9,adult  Discussed with the patient the risk posed by an increased BMI. Discussed importance of portion control, calorie counting and at least 150 minutes of physical activity weekly. Avoid sweet beverages and drink more water. Eat at least 6 servings of fruit and vegetables daily   8. Lipid screening  - Lipid panel  9. Diabetes mellitus screening  - Hemoglobin A1c  10. Routine screening for STI (sexually transmitted infection)  - RPR - HIV antibody - Hepatitis, Acute - GC/Chlamydia probe amp ([place])not at [facility]  11. Anxiety, generalized  Stable at this time

## 2017-12-08 LAB — GC/CHLAMYDIA PROBE AMP (~~LOC~~) NOT AT ARMC
Chlamydia: NEGATIVE
Neisseria Gonorrhea: NEGATIVE

## 2017-12-09 ENCOUNTER — Other Ambulatory Visit: Payer: Self-pay | Admitting: Family Medicine

## 2017-12-09 ENCOUNTER — Encounter: Payer: Self-pay | Admitting: Family Medicine

## 2017-12-09 DIAGNOSIS — M25541 Pain in joints of right hand: Secondary | ICD-10-CM

## 2017-12-09 DIAGNOSIS — M25542 Pain in joints of left hand: Secondary | ICD-10-CM

## 2017-12-09 DIAGNOSIS — M7989 Other specified soft tissue disorders: Secondary | ICD-10-CM

## 2017-12-09 DIAGNOSIS — Z87898 Personal history of other specified conditions: Secondary | ICD-10-CM

## 2017-12-09 LAB — CBC WITH DIFFERENTIAL/PLATELET
Basophils Absolute: 9 cells/uL (ref 0–200)
Basophils Relative: 0.1 %
EOS ABS: 78 {cells}/uL (ref 15–500)
EOS PCT: 0.9 %
HEMATOCRIT: 36.1 % (ref 35.0–45.0)
HEMOGLOBIN: 12.6 g/dL (ref 11.7–15.5)
Lymphs Abs: 2975 cells/uL (ref 850–3900)
MCH: 31.7 pg (ref 27.0–33.0)
MCHC: 34.9 g/dL (ref 32.0–36.0)
MCV: 90.9 fL (ref 80.0–100.0)
MPV: 9.9 fL (ref 7.5–12.5)
Monocytes Relative: 3.9 %
NEUTROS ABS: 5298 {cells}/uL (ref 1500–7800)
Neutrophils Relative %: 60.9 %
Platelets: 296 10*3/uL (ref 140–400)
RBC: 3.97 10*6/uL (ref 3.80–5.10)
RDW: 12.3 % (ref 11.0–15.0)
Total Lymphocyte: 34.2 %
WBC: 8.7 10*3/uL (ref 3.8–10.8)
WBCMIX: 339 {cells}/uL (ref 200–950)

## 2017-12-09 LAB — LIPID PANEL
CHOL/HDL RATIO: 2.4 (calc) (ref <5.0)
CHOLESTEROL: 146 mg/dL (ref <200)
HDL: 62 mg/dL (ref >50)
LDL Cholesterol (Calc): 69 mg/dL (calc)
Non-HDL Cholesterol (Calc): 84 mg/dL (calc) (ref <130)
Triglycerides: 72 mg/dL (ref <150)

## 2017-12-09 LAB — HEPATITIS PANEL, ACUTE
HEP A IGM: NONREACTIVE
HEP B S AG: NONREACTIVE
Hep B C IgM: NONREACTIVE
Hepatitis C Ab: NONREACTIVE
SIGNAL TO CUT-OFF: 0.01 (ref <1.00)

## 2017-12-09 LAB — COMPLETE METABOLIC PANEL WITH GFR
AG Ratio: 1.7 (calc) (ref 1.0–2.5)
ALBUMIN MSPROF: 4.5 g/dL (ref 3.6–5.1)
ALKALINE PHOSPHATASE (APISO): 63 U/L (ref 33–115)
ALT: 11 U/L (ref 6–29)
AST: 14 U/L (ref 10–30)
BILIRUBIN TOTAL: 0.3 mg/dL (ref 0.2–1.2)
BUN: 14 mg/dL (ref 7–25)
CO2: 28 mmol/L (ref 20–32)
Calcium: 9.5 mg/dL (ref 8.6–10.2)
Chloride: 103 mmol/L (ref 98–110)
Creat: 0.81 mg/dL (ref 0.50–1.10)
GFR, Est African American: 114 mL/min/{1.73_m2} (ref >=60)
GFR, Est Non African American: 98 mL/min/{1.73_m2} (ref >=60)
GLOBULIN: 2.6 g/dL (ref 1.9–3.7)
Glucose, Bld: 93 mg/dL (ref 65–139)
Potassium: 3.7 mmol/L (ref 3.5–5.3)
SODIUM: 138 mmol/L (ref 135–146)
Total Protein: 7.1 g/dL (ref 6.1–8.1)

## 2017-12-09 LAB — ANA,IFA RA DIAG PNL W/RFLX TIT/PATN
Anti Nuclear Antibody(ANA): POSITIVE — AB
Rhuematoid fact SerPl-aCnc: <14 IU/mL (ref <14)

## 2017-12-09 LAB — HEMOGLOBIN A1C
EAG (MMOL/L): 5.5 (calc)
Hgb A1c MFr Bld: 5.1 % of total Hgb (ref <5.7)
MEAN PLASMA GLUCOSE: 100 (calc)

## 2017-12-09 LAB — ANTI-NUCLEAR AB-TITER (ANA TITER): ANA Titer 1: 1:80 {titer} — ABNORMAL HIGH

## 2017-12-09 LAB — HIV ANTIBODY (ROUTINE TESTING W REFLEX): HIV 1&2 Ab, 4th Generation: NONREACTIVE

## 2017-12-09 LAB — URIC ACID: Uric Acid, Serum: 3.7 mg/dL (ref 2.5–7.0)

## 2017-12-09 LAB — RPR: RPR: NONREACTIVE

## 2017-12-10 DIAGNOSIS — M654 Radial styloid tenosynovitis [de Quervain]: Secondary | ICD-10-CM | POA: Diagnosis not present

## 2017-12-11 ENCOUNTER — Other Ambulatory Visit: Payer: Self-pay | Admitting: Family Medicine

## 2017-12-11 DIAGNOSIS — R768 Other specified abnormal immunological findings in serum: Secondary | ICD-10-CM

## 2017-12-11 DIAGNOSIS — M2550 Pain in unspecified joint: Secondary | ICD-10-CM

## 2017-12-28 DIAGNOSIS — R768 Other specified abnormal immunological findings in serum: Secondary | ICD-10-CM | POA: Diagnosis not present

## 2017-12-28 DIAGNOSIS — M79641 Pain in right hand: Secondary | ICD-10-CM | POA: Diagnosis not present

## 2017-12-28 DIAGNOSIS — M79642 Pain in left hand: Secondary | ICD-10-CM

## 2018-01-09 ENCOUNTER — Other Ambulatory Visit: Payer: Self-pay | Admitting: Obstetrics and Gynecology

## 2018-01-12 ENCOUNTER — Encounter: Payer: Self-pay | Admitting: Family Medicine

## 2018-01-12 DIAGNOSIS — R768 Other specified abnormal immunological findings in serum: Secondary | ICD-10-CM

## 2018-01-12 DIAGNOSIS — M2550 Pain in unspecified joint: Secondary | ICD-10-CM

## 2018-01-12 DIAGNOSIS — M79641 Pain in right hand: Secondary | ICD-10-CM | POA: Diagnosis not present

## 2018-01-12 DIAGNOSIS — M79642 Pain in left hand: Secondary | ICD-10-CM | POA: Diagnosis not present

## 2018-01-26 ENCOUNTER — Ambulatory Visit: Payer: 59 | Admitting: Family Medicine

## 2018-01-26 ENCOUNTER — Encounter: Payer: Self-pay | Admitting: Family Medicine

## 2018-01-26 ENCOUNTER — Other Ambulatory Visit (HOSPITAL_COMMUNITY)
Admission: RE | Admit: 2018-01-26 | Discharge: 2018-01-26 | Disposition: A | Discharge status: Home / Self Care | Payer: 59 | Source: Ambulatory Visit | Attending: Family Medicine | Admitting: Family Medicine

## 2018-01-26 VITALS — BP 110/64 | HR 93 | Temp 98.3°F | Resp 16 | Ht 62.0 in | Wt 165.0 lb

## 2018-01-26 DIAGNOSIS — Z23 Encounter for immunization: Secondary | ICD-10-CM | POA: Diagnosis not present

## 2018-01-26 DIAGNOSIS — F439 Reaction to severe stress, unspecified: Secondary | ICD-10-CM | POA: Insufficient documentation

## 2018-01-26 DIAGNOSIS — M2550 Pain in unspecified joint: Secondary | ICD-10-CM

## 2018-01-26 DIAGNOSIS — J3089 Other allergic rhinitis: Secondary | ICD-10-CM

## 2018-01-26 DIAGNOSIS — N760 Acute vaginitis: Secondary | ICD-10-CM | POA: Insufficient documentation

## 2018-01-26 MED ORDER — FLUCONAZOLE 150 MG PO TABS: 150.0000 mg | ORAL_TABLET | ORAL | 1 refills | Status: DC

## 2018-01-26 NOTE — Progress Notes (Signed)
Name: Patricia Horn   MRN: [id number]    DOB: Dec 14, 1990   Date:01/26/2018       Progress Note  Subjective  Chief Complaint  Chief Complaint  Patient presents with  . Vaginal Discharge    Onset-Wednesday last week, yellow/greenish discharge at times and then clear at other times. States she is also having vaginal itching. Has takes AZO for yeast and help alittle    HPI  Vaginal Discharge: she states she noticed vaginal discharge, white to yellow or green, itching, history of yeast infection and had positive tests in the past. Not sexually active in over one year. She has IUD  AR: she is using nasal sprays and oral medications. She has noticed some left ear fullness, no fever or chills.   Stress: her dog was diagnosed with lung cancer back in August, mother hospitalized at [facility] in September with complications dehydration and now has c. difi with possible perforation of her bowel.    Patient Active Problem List   Diagnosis Date Noted  . Bilateral hand pain 12/28/2017  . Right carpal tunnel syndrome 11/10/2016  . Urticaria 11/09/2015  . 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 Obese 01/05/2015  . Umbilical cyst 81/82/9937  . Perennial allergic rhinitis 11/16/2014  . Anxiety, generalized 10/06/2014  . ANA positive 10/06/2014  . History of dysmenorrhea 10/06/2014    Past Surgical History:  Procedure Laterality Date  . HERNIA REPAIR  1696   umbilical   . MYOMECTOMY  2015    Family History  Problem Relation Age of Onset  . Hypertension Mother   . Scleroderma Mother   . Pulmonary fibrosis Mother   . Asthma Mother   . Cancer Neg Hx     Social History   Socioeconomic History  . Marital status: Single    Spouse name: Not on file  . Number of children: 0  . Years of education: Not on file  . Highest education level: Master's degree (e.g., MA, MS, MEng, MEd, MSW, MBA)  Occupational History  . 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 Financial resource strain: Not very hard  . Food insecurity:    Worry: Never true    Inability: Never true  . Transportation needs:    Medical: No    Non-medical: No  Tobacco Use  . Smoking status: Never Smoker  . Smokeless tobacco: Never Used  Substance and Sexual Activity  . Alcohol use: Yes    Alcohol/week: 1.0 standard drinks    Types: 1 Standard drinks or equivalent per week    Comment: ocassional  . Drug use: No  . Sexual activity: Not Currently    Partners: Male    Birth control/protection: IUD  Lifestyle  . Physical activity:    Days per week: 3 days    Minutes per session: 60 min  . Stress: Not at all  Relationships  . Social connections:    Talks on phone: Not on file    Gets together: Not on file    Attends religious service: Not on file    Active member of club or organization: Not on file    Attends meetings of clubs or organizations: Not on file    Relationship status: Not on file  . Intimate partner violence:    Fear of current or ex partner: Not on file    Emotionally abused: Not on file    Physically abused: Not on file  Forced sexual activity: Not on file  Other Topics Concern  . 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      Current Outpatient Medications:  .  Azelastine HCl 137 MCG/SPRAY SOLN, Place 2 sprays into the nose daily., Disp: 90 mL, Rfl: 1 .  fluticasone (FLONASE) 50 MCG/ACT nasal spray, Place 2 sprays into both nostrils daily., Disp: 16 g, Rfl: 1 .  levocetirizine (XYZAL) 5 MG tablet, Take 1 tablet (5 mg total) by mouth every evening., Disp: 90 tablet, Rfl: 1 .  levonorgestrel (MIRENA) 20 MCG/24HR IUD, 1 Intra Uterine Device (1 each total) by Intrauterine route once., Disp: 1 each, Rfl: 0 .  DRYSOL 20 % external solution, APPLY TOPICALLY AT BEDTIME (Patient not taking: Reported on  01/26/2018), Disp: 60 mL, Rfl: 3 .  fluconazole (DIFLUCAN) 150 MG tablet, Take 1 tablet (150 mg total) by mouth every other day., Disp: 3 tablet, Rfl: 1  Allergies  Allergen Reactions  . Peanut Oil Swelling    Eyelids swell.    I personally reviewed active problem list, medication list, allergies, family history, social history with the patient/caregiver today.   ROS  Constitutional: Negative for fever or weight change.  Respiratory: Negative for cough and shortness of breath.   Cardiovascular: Negative for chest pain or palpitations.  Gastrointestinal: Negative for abdominal pain, no bowel changes.  Musculoskeletal: Negative for gait problem or joint swelling.  Skin: Negative for rash.  Neurological: Negative for dizziness or headache.  No other specific complaints in a complete review of systems (except as listed in HPI above).  Objective  Vitals:   01/26/18 0904  BP: 110/64  Pulse: 93  Resp: 16  Temp: 98.3 F (36.8 C)  TempSrc: Oral  SpO2: 99%  Weight: 165 lb (74.8 kg)  Height: 5\' 2"  (1.575 m)    Body mass index is 30.18 kg/m.  Physical Exam  Constitutional: Patient appears well-developed and well-nourished. Obese No distress.  HEENT: head atraumatic, normocephalic, pupils equal and reactive to light, , neck supple, throat within normal limits Cardiovascular: Normal rate, regular rhythm and normal heart sounds.  No murmur heard. No BLE edema. Pulmonary/Chest: Effort normal and breath sounds normal. No respiratory distress. Abdominal: Soft.  There is no tenderness. Psychiatric: Patient has a normal mood and affect. behavior is normal. Judgment and thought content normal.  Recent Results (from the past 2160 hour(s))  GC/Chlamydia probe amp ([place])not at [facility]     Status: None   Collection Time: 12/07/17 12:00 AM  Result Value Ref Range   Chlamydia Negative     Comment: Normal Reference Range - Negative   Neisseria gonorrhea Negative     Comment: Normal  Reference Range - Negative  ANA,IFA RA Diag Pnl w/rflx Tit/Patn     Status: Abnormal   Collection Time: 12/07/17  3:03 PM  Result Value Ref Range   Anti Nuclear Antibody(ANA) POSITIVE (A) NEGATIVE    Comment: ANA IFA is a first line screen for detecting the presence of up to approximately 150 autoantibodies in various autoimmune diseases. A positive ANA IFA result is suggestive of autoimmune disease and reflexes to titer and pattern. Further laboratory testing may be considered if clinically indicated. . Visit Physician FAQs for interpretation of all antibodies in the [place], prevalence, and association with diseases at http://education.[facility].com/ ZOX/WRU045 .    Rhuematoid  fact SerPl-aCnc <79 <02 IU/mL   Cyclic Citrullin Peptide Ab <16 UNITS    Comment: Reference Range Negative:            <20 Weak Positive:       20-39 Moderate Positive:   40-59 Strong Positive:     >59 .    INTERPRETATION      Comment: . A positive ANA, IFA indicates that one or more  antibodies associated with connective tissue disease could be positive. The RF and CCP assays are each  65-70% sensitive for established rheumatoid arthritis.  While it is still possible that this patient has  rheumatoid arthritis, other connective tissue  diseases should be considered.  .   Uric acid     Status: None   Collection Time: 12/07/17  3:03 PM  Result Value Ref Range   Uric Acid, Serum 3.7 2.5 - 7.0 mg/dL    Comment: Therapeutic target for gout patients: <6.0 mg/dL .   CBC with Differential/Platelet     Status: None   Collection Time: 12/07/17  3:03 PM  Result Value Ref Range   WBC 8.7 3.8 - 10.8 Thousand/uL   RBC 3.97 3.80 - 5.10 Million/uL   Hemoglobin 12.6 11.7 - 15.5 g/dL   HCT 36.1 35.0 - 45.0 %   MCV 90.9 80.0 - 100.0 fL   MCH 31.7 27.0 - 33.0 pg   MCHC 34.9 32.0 - 36.0 g/dL   RDW 12.3 11.0 - 15.0 %   Platelets 296 140 - 400 Thousand/uL   MPV 9.9 7.5 - 12.5 fL   Neutro Abs 5,298  1,500 - 7,800 cells/uL   Lymphs Abs 2,975 850 - 3,900 cells/uL   WBC mixed population 339 200 - 950 cells/uL   Eosinophils Absolute 78 15 - 500 cells/uL   Basophils Absolute 9 0 - 200 cells/uL   Neutrophils Relative % 60.9 %   Total Lymphocyte 34.2 %   Monocytes Relative 3.9 %   Eosinophils Relative 0.9 %   Basophils Relative 0.1 %  COMPLETE METABOLIC PANEL WITH GFR     Status: None   Collection Time: 12/07/17  3:03 PM  Result Value Ref Range   Glucose, Bld 93 65 - 139 mg/dL    Comment: .        Non-fasting reference interval .    BUN 14 7 - 25 mg/dL   Creat 0.81 0.50 - 1.10 mg/dL   GFR, Est Non African American 98 > OR = 60 mL/min/1.45m2   GFR, Est African American 114 > OR = 60 mL/min/1.70m2   BUN/Creatinine Ratio NOT APPLICABLE 6 - 22 (calc)   Sodium 138 135 - 146 mmol/L   Potassium 3.7 3.5 - 5.3 mmol/L   Chloride 103 98 - 110 mmol/L   CO2 28 20 - 32 mmol/L   Calcium 9.5 8.6 - 10.2 mg/dL   Total Protein 7.1 6.1 - 8.1 g/dL   Albumin 4.5 3.6 - 5.1 g/dL   Globulin 2.6 1.9 - 3.7 g/dL (calc)   AG Ratio 1.7 1.0 - 2.5 (calc)   Total Bilirubin 0.3 0.2 - 1.2 mg/dL   Alkaline phosphatase (APISO) 63 33 - 115 U/L   AST 14 10 - 30 U/L   ALT 11 6 - 29 U/L  Lipid panel     Status: None   Collection Time: 12/07/17  3:03 PM  Result Value Ref Range   Cholesterol 146 <200 mg/dL   HDL 62 >50 mg/dL   Triglycerides 72 <150 mg/dL  LDL Cholesterol (Calc) 69 mg/dL (calc)    Comment: Reference range: <100 . Desirable range <100 mg/dL for primary prevention;   <70 mg/dL for patients with CHD or diabetic patients  with > or = 2 CHD risk factors. Marland Kitchen LDL-C is now calculated using the Martin-Hopkins  calculation, which is a validated novel method providing  better accuracy than the Friedewald equation in the  estimation of LDL-C.  Cresenciano Genre et al. Annamaria Helling. 7846;962(95): 2061-2068  (http://education.[facility].com/faq/FAQ164)    Total CHOL/HDL Ratio 2.4 <5.0 (calc)   Non-HDL  Cholesterol (Calc) 84 <130 mg/dL (calc)    Comment: For patients with diabetes plus 1 major ASCVD risk  factor, treating to a non-HDL-C goal of <100 mg/dL  (LDL-C of <70 mg/dL) is considered a therapeutic  option.   Hemoglobin A1c     Status: None   Collection Time: 12/07/17  3:03 PM  Result Value Ref Range   Hgb A1c MFr Bld 5.1 <5.7 % of total Hgb    Comment: For the purpose of screening for the presence of diabetes: . <5.7%       Consistent with the absence of diabetes 5.7-6.4%    Consistent with increased risk for diabetes             (prediabetes) > or =6.5%  Consistent with diabetes . This assay result is consistent with a decreased risk of diabetes. . Currently, no consensus exists regarding use of hemoglobin A1c for diagnosis of diabetes in children. . According to American Diabetes Association (ADA) guidelines, hemoglobin A1c <7.0% represents optimal control in non-pregnant diabetic patients. Different metrics may apply to specific patient populations.  Standards of Medical Care in Diabetes(ADA). .    Mean Plasma Glucose 100 (calc)   eAG (mmol/L) 5.5 (calc)  RPR     Status: None   Collection Time: 12/07/17  3:03 PM  Result Value Ref Range   RPR Ser Ql NON-REACTIVE NON-REACTI  HIV antibody     Status: None   Collection Time: 12/07/17  3:03 PM  Result Value Ref Range   HIV 1&2 Ab, 4th Generation NON-REACTIVE NON-REACTI    Comment: HIV-1 antigen and HIV-1/HIV-2 antibodies were not detected. There is no laboratory evidence of HIV infection. Marland Kitchen PLEASE NOTE: This information has been disclosed to you from records whose confidentiality may be protected by state law.  If your state requires such protection, then the state law prohibits you from making any further disclosure of the information without the specific written consent of the person to whom it pertains, or as otherwise permitted by law. A general authorization for the release of medical or other  information is NOT sufficient for this purpose. . For additional information please refer to http://education.[facility].com/faq/FAQ106 (This link is being provided for informational/ educational purposes only.) . Marland Kitchen The performance of this assay has not been clinically validated in patients less than 34 years old. .   Hepatitis, Acute     Status: None   Collection Time: 12/07/17  3:03 PM  Result Value Ref Range   Hep A IgM NON-REACTIVE NON-REACTI   Hepatitis B Surface Ag NON-REACTIVE NON-REACTI   Hep B C IgM NON-REACTIVE NON-REACTI   Hepatitis C Ab NON-REACTIVE NON-REACTI   SIGNAL TO CUT-OFF 0.01 <1.00    Comment: . HCV antibody was non-reactive. There is no laboratory  evidence of HCV infection. . In most cases, no further action is required. However, if recent HCV exposure is suspected, a test for HCV RNA (test code [id number]) is  suggested. . For additional information please refer to http://education.[facility].com/faq/FAQ22v1 (This link is being provided for informational/ educational purposes only.) .   Anti-nuclear ab-titer (ANA titer)     Status: Abnormal   Collection Time: 12/07/17  3:03 PM  Result Value Ref Range   ANA Pattern 1 HOMOGENEOUS (A)     Comment: Homogeneous pattern is associated with systemic lupus erythematosus (SLE), drug induced lupus and juvenile idiopathic arthritis.    ANA Titer 1 1:80 (H) titer    Comment: A low level ANA titer may be present in pre-clinical autoimmune diseases and normal individuals.                 Reference Range                 <1:40        Negative                 1:40-1:80    Low Antibody Level                 >1:80        Elevated Antibody Level .       PHQ2/9: Depression screen [facility] 2/9 12/07/2017 06/26/2016 11/09/2015 09/18/2015 06/21/2015  Decreased Interest 0 0 0 0 0  Down, Depressed, Hopeless 0 0 0 0 0  PHQ - 2 Score 0 0 0 0 0     Fall Risk: Fall Risk  12/07/2017 06/26/2016 11/09/2015 09/18/2015  06/21/2015  Falls in the past year? No No No No No     Assessment & Plan  1. Acute vaginitis  - WET PREP FOR TRICH, YEAST, CLUE - fluconazole (DIFLUCAN) 150 MG tablet; Take 1 tablet (150 mg total) by mouth every other day.  Dispense: 3 tablet; Refill: 1  2. Need for immunization against influenza  - Flu Vaccine QUAD 6+ mos PF IM (Fluarix Quad PF)  3. Arthralgia, unspecified joint  Seeing Rheumatologist , tests negative except high ANA and being monitored   4. Stress  Mother is at [facility] , going on for the past 2 weeks.

## 2018-01-27 ENCOUNTER — Other Ambulatory Visit: Payer: Self-pay | Admitting: Family Medicine

## 2018-01-27 ENCOUNTER — Encounter: Payer: Self-pay | Admitting: Family Medicine

## 2018-01-27 LAB — CERVICOVAGINAL ANCILLARY ONLY
BACTERIAL VAGINITIS: NEGATIVE
Candida vaginitis: POSITIVE — AB
TRICH (WINDOWPATH): NEGATIVE

## 2018-01-28 ENCOUNTER — Encounter: Payer: Self-pay | Admitting: Family Medicine

## 2018-02-15 ENCOUNTER — Encounter: Payer: Self-pay | Admitting: Family Medicine

## 2018-02-18 NOTE — Progress Notes (Signed)
ANNUAL PREVENTATIVE CARE GYN  ENCOUNTER NOTE  Subjective:       Patricia Horn is a 27 y.o. G0P0000 female here for a routine annual gynecologic exam.  Current complaints:  1. S/p diflucan and boric acid for yeast infection-currently patient is not experiencing any symptoms; no vaginal discharge or vulvar itching.  Amenorrhea on IUD. No pelvic pain. Not sexually active. Bowel and bladder function are normal. Past history of severe dysmenorrhea. Past history of uterine leiomyoma requiring laparoscopic myomectomy-posterior fibroid was a pedunculated fibroid; anterior fibroid was subserosal. Last pelvic ultrasound demonstrated a 0.5 cm intramural fibroid.   Gynecologic History No LMP recorded. (Menstrual status: IUD). Contraception: IUD Last Pap: 03/03/2017 neg. Results were: normal Last mammogram: n/a. Results were:n/a History of monilia vulvovaginitis, currently asymptomatic; status post treatment with Diflucan and boric acid capsules  Obstetric History OB History  Gravida Para Term Preterm AB Living  0 0 0 0 0 0  SAB TAB Ectopic Multiple Live Births  0 0 0 0    Obstetric Comments  1st Menstrual Cycle:  8    Past Medical History:  Diagnosis Date  . Anxiety   . Depression   . Environmental allergies   . Tendinitis   . Umbilical hernia     Past Surgical History:  Procedure Laterality Date  . HERNIA REPAIR  7672   umbilical   . MYOMECTOMY  2015    Current Outpatient Medications on File Prior to Visit  Medication Sig Dispense Refill  . Azelastine HCl 137 MCG/SPRAY SOLN Place 2 sprays into the nose daily. 90 mL 1  . DRYSOL 20 % external solution APPLY TOPICALLY AT BEDTIME (Patient not taking: Reported on 01/26/2018) 60 mL 3  . fluconazole (DIFLUCAN) 150 MG tablet Take 1 tablet (150 mg total) by mouth every other day. 3 tablet 1  . fluticasone (FLONASE) 50 MCG/ACT nasal spray Place 2 sprays into both nostrils daily. 16 g 1  . levocetirizine (XYZAL) 5 MG tablet Take 1  tablet (5 mg total) by mouth every evening. 90 tablet 1  . levonorgestrel (MIRENA) 20 MCG/24HR IUD 1 Intra Uterine Device (1 each total) by Intrauterine route once. 1 each 0   No current facility-administered medications on file prior to visit.     Allergies  Allergen Reactions  . Peanut Oil Swelling    Eyelids swell.    Social History   Socioeconomic History  . Marital status: Single    Spouse name: Not on file  . Number of children: 0  . Years of education: Not on file  . Highest education level: Master's degree (e.g., MA, MS, MEng, MEd, MSW, MBA)  Occupational History  . 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 Financial resource strain: Not very hard  . Food insecurity:    Worry: Never true    Inability: Never true  . Transportation needs:    Medical: No    Non-medical: No  Tobacco Use  . Smoking status: Never Smoker  . Smokeless tobacco: Never Used  Substance and Sexual Activity  . Alcohol use: Yes    Alcohol/week: 1.0 standard drinks    Types: 1 Standard drinks or equivalent per week    Comment: ocassional  . Drug use: No  . Sexual activity: Not Currently    Partners: Male    Birth control/protection: IUD  Lifestyle  . Physical activity:    Days per week: 3 days  Minutes per session: 60 min  . Stress: Not at all  Relationships  . Social connections:    Talks on phone: Not on file    Gets together: Not on file    Attends religious service: Not on file    Active member of club or organization: Not on file    Attends meetings of clubs or organizations: Not on file    Relationship status: Not on file  . Intimate partner violence:    Fear of current or ex partner: Not on file    Emotionally abused: Not on file    Physically abused: Not on file    Forced sexual activity: Not on file  Other Topics Concern  . 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     Family History  Problem Relation Age of Onset  . Hypertension Mother   . Scleroderma Mother   . Pulmonary fibrosis Mother   . Asthma Mother   . Cancer Neg Hx     The following portions of the patient's history were reviewed and updated as appropriate: allergies, current medications, past family history, past medical history, past social history, past surgical history and problem list.  Review of Systems Review of Systems  Constitutional: Negative.   HENT: Negative.   Eyes: Negative.   Respiratory: Negative.   Cardiovascular: Negative.   Gastrointestinal: Negative.   Genitourinary: Negative.        Amenorrhea  Musculoskeletal: Negative.   Skin: Negative.   Neurological: Negative.   Endo/Heme/Allergies: Negative.   Psychiatric/Behavioral: The patient is nervous/anxious.       Objective:   BP 103/68   Pulse 93   Ht 5\' 2"  (1.575 m)   Wt 165 lb 9.6 oz (75.1 kg)   LMP  (LMP Unknown)   BMI 30.29 kg/m  CONSTITUTIONAL: Well-developed, well-nourished female in no acute distress.  PSYCHIATRIC: Normal mood and affect. Normal behavior. Normal judgment and thought content. [place]: Alert and oriented to person, place, and time. Normal muscle tone coordination. No cranial nerve deficit noted. HENT:  Normocephalic, atraumatic, External right and left ear normal. EYES: Conjunctivae and EOM are normal. . No scleral icterus.  NECK: Normal range of motion, supple, no masses.  Normal thyroid.  SKIN: Skin is warm and dry. No rash noted. Not diaphoretic. No erythema. No pallor. CARDIOVASCULAR: Normal heart rate noted, regular rhythm, no murmur. RESPIRATORY: Clear to auscultation bilaterally. Effort and breath sounds normal, no problems with respiration noted. BREASTS: Symmetric in size. No masses, skin changes, nipple drainage, or lymphadenopathy. ABDOMEN: Soft, normal bowel sounds, no  distention noted.  No tenderness, rebound or guarding.  BLADDER: Normal PELVIC:  External Genitalia: Normal without rash or ulcer  BUS: Normal  Vagina: Normal; minimal white secretions in vaginal vault  Cervix: Normal; IUD strings-0.5 cm (visualized with Q-tip; minimal cervical motion tenderness  Uterus: Normal; midplane, normal size and shape, mobile, nontender  Adnexa: Normal; nonpalpable and nontender  RV: External Exam NormaI  MUSCULOSKELETAL: Normal range of motion. No tenderness.  No cyanosis, clubbing, or edema.  2+ distal pulses. LYMPHATIC: No Axillary, Supraclavicular, or Inguinal Adenopathy.    Assessment:   Annual gynecologic examination 27 y.o. Contraception: IUD Mirena bmi-30  Past history of fibroids, status post laparoscopic myomectomy x2 Past history of severe dysmenorrhea, asymptomatic with IUD in place at this time  Plan:  Pap: Pap, Reflex if ASCUS Mammogram: not indicated Stool Guaiac Testing:  Not Indicated Labs: thru pcp Routine preventative health maintenance measures emphasized: Exercise/Diet/Weight control, Tobacco Warnings, Alcohol/Substance use risks and Safe Sex Information given regarding mindfulness, yoga, and the [facility] Return to [place]  am acting as a Education administrator for [employer]. I have reviewed, updated and concur with the information scribed by Joyice Faster, CMA   Joyice Faster, CMA  Brayton Mars, MD   Note: This dictation was prepared with Dragon dictation along with smaller phrase technology. Any transcriptional errors that result from this process are unintentional.

## 2018-02-23 ENCOUNTER — Ambulatory Visit (INDEPENDENT_AMBULATORY_CARE_PROVIDER_SITE_OTHER): Payer: 59 | Admitting: Obstetrics and Gynecology

## 2018-02-23 ENCOUNTER — Other Ambulatory Visit (HOSPITAL_COMMUNITY)
Admission: RE | Admit: 2018-02-23 | Discharge: 2018-02-23 | Disposition: A | Discharge status: Home / Self Care | Payer: 59 | Source: Ambulatory Visit | Attending: Obstetrics and Gynecology | Admitting: Obstetrics and Gynecology

## 2018-02-23 ENCOUNTER — Encounter: Payer: Self-pay | Admitting: Obstetrics and Gynecology

## 2018-02-23 VITALS — BP 103/68 | HR 93 | Ht 62.0 in | Wt 165.6 lb

## 2018-02-23 DIAGNOSIS — Z01419 Encounter for gynecological examination (general) (routine) without abnormal findings: Secondary | ICD-10-CM

## 2018-02-23 DIAGNOSIS — Z6830 Body mass index (BMI) 30.0-30.9, adult: Secondary | ICD-10-CM | POA: Diagnosis not present

## 2018-02-23 DIAGNOSIS — Z8742 Personal history of other diseases of the female genital tract: Secondary | ICD-10-CM | POA: Diagnosis not present

## 2018-02-23 DIAGNOSIS — F419 Anxiety disorder, unspecified: Secondary | ICD-10-CM

## 2018-02-23 DIAGNOSIS — N912 Amenorrhea, unspecified: Secondary | ICD-10-CM

## 2018-02-23 DIAGNOSIS — Z86018 Personal history of other benign neoplasm: Secondary | ICD-10-CM | POA: Diagnosis not present

## 2018-02-23 DIAGNOSIS — R634 Abnormal weight loss: Secondary | ICD-10-CM

## 2018-02-23 NOTE — Patient Instructions (Signed)
1.  Pap smear is done. 2.  Self breast awareness is encouraged. 3.  Screening labs are to be obtained to primary care 4.  Contraception-Mirena IUD 5.  Continue with healthy eating and exercise 6.  Return in 1 year for annual exam-Dr. Cherry 7.  Information is given regarding mindfulness therapy and the [facility]  Health Maintenance, Female Adopting a healthy lifestyle and getting preventive care can go a long way to promote health and wellness. Talk with your health care provider about what schedule of regular examinations is right for you. This is a good chance for you to check in with your provider about disease prevention and staying healthy. In between checkups, there are plenty of things you can do on your own. Experts have done a lot of research about which lifestyle changes and preventive measures are most likely to keep you healthy. Ask your health care provider for more information. Weight and diet Eat a healthy diet  Be sure to include plenty of vegetables, fruits, low-fat dairy products, and lean protein.  Do not eat a lot of foods high in solid fats, added sugars, or salt.  Get regular exercise. This is one of the most important things you can do for your health. ? Most adults should exercise for at least 150 minutes each week. The exercise should increase your heart rate and make you sweat (moderate-intensity exercise). ? Most adults should also do strengthening exercises at least twice a week. This is in addition to the moderate-intensity exercise.  Maintain a healthy weight  Body mass index (BMI) is a measurement that can be used to identify possible weight problems. It estimates body fat based on height and weight. Your health care provider can help determine your BMI and help you achieve or maintain a healthy weight.  For females 95 years of age and older: ? A BMI below 18.5 is considered underweight. ? A BMI of 18.5 to 24.9 is normal. ? A BMI of 25 to 29.9  is considered overweight. ? A BMI of 30 and above is considered obese.  Watch levels of cholesterol and blood lipids  You should start having your blood tested for lipids and cholesterol at 29 years of age, then have this test every 5 years.  You may need to have your cholesterol levels checked more often if: ? Your lipid or cholesterol levels are high. ? You are older than 29 years of age. ? You are at high risk for heart disease.  Cancer screening Lung Cancer  Lung cancer screening is recommended for adults 38-39 years old who are at high risk for lung cancer because of a history of smoking.  A yearly low-dose CT scan of the lungs is recommended for people who: ? Currently smoke. ? Have quit within the past 15 years. ? Have at least a 30-pack-year history of smoking. A pack year is smoking an average of one pack of cigarettes a day for 1 year.  Yearly screening should continue until it has been 15 years since you quit.  Yearly screening should stop if you develop a health problem that would prevent you from having lung cancer treatment.  Breast Cancer  Practice breast self-awareness. This means understanding how your breasts normally appear and feel.  It also means doing regular breast self-exams. Let your health care provider know about any changes, no matter how small.  If you are in your 20s or 30s, you should have a clinical breast exam (CBE) by a  health care provider every 1-3 years as part of a regular health exam.  If you are 63 or older, have a CBE every year. Also consider having a breast X-ray (mammogram) every year.  If you have a family history of breast cancer, talk to your health care provider about genetic screening.  If you are at high risk for breast cancer, talk to your health care provider about having an MRI and a mammogram every year.  Breast cancer gene (BRCA) assessment is recommended for women who have family members with BRCA-related cancers.  BRCA-related cancers include: ? Breast. ? Ovarian. ? Tubal. ? Peritoneal cancers.  Results of the assessment will determine the need for genetic counseling and BRCA1 and BRCA2 testing.  Cervical Cancer Your health care provider may recommend that you be screened regularly for cancer of the pelvic organs (ovaries, uterus, and vagina). This screening involves a pelvic examination, including checking for microscopic changes to the surface of your cervix (Pap test). You may be encouraged to have this screening done every 3 years, beginning at age 12.  For women ages 41-65, health care providers may recommend pelvic exams and Pap testing every 3 years, or they may recommend the Pap and pelvic exam, combined with testing for human papilloma virus (HPV), every 5 years. Some types of HPV increase your risk of cervical cancer. Testing for HPV may also be done on women of any age with unclear Pap test results.  Other health care providers may not recommend any screening for nonpregnant women who are considered low risk for pelvic cancer and who do not have symptoms. Ask your health care provider if a screening pelvic exam is right for you.  If you have had past treatment for cervical cancer or a condition that could lead to cancer, you need Pap tests and screening for cancer for at least 20 years after your treatment. If Pap tests have been discontinued, your risk factors (such as having a new sexual partner) need to be reassessed to determine if screening should resume. Some women have medical problems that increase the chance of getting cervical cancer. In these cases, your health care provider may recommend more frequent screening and Pap tests.  Colorectal Cancer  This type of cancer can be detected and often prevented.  Routine colorectal cancer screening usually begins at 29 years of age and continues through 29 years of age.  Your health care provider may recommend screening at an earlier age if  you have risk factors for colon cancer.  Your health care provider may also recommend using home test kits to check for hidden blood in the stool.  A small camera at the end of a tube can be used to examine your colon directly (sigmoidoscopy or colonoscopy). This is done to check for the earliest forms of colorectal cancer.  Routine screening usually begins at age 20.  Direct examination of the colon should be repeated every 5-10 years through 29 years of age. However, you may need to be screened more often if early forms of precancerous polyps or small growths are found.  Skin Cancer  Check your skin from head to toe regularly.  Tell your health care provider about any new moles or changes in moles, especially if there is a change in a mole's shape or color.  Also tell your health care provider if you have a mole that is larger than the size of a pencil eraser.  Always use sunscreen. Apply sunscreen liberally and repeatedly throughout  the day.  Protect yourself by wearing long sleeves, pants, a wide-brimmed hat, and sunglasses whenever you are outside.  Heart disease, diabetes, and high blood pressure  High blood pressure causes heart disease and increases the risk of stroke. High blood pressure is more likely to develop in: ? People who have blood pressure in the high end of the normal range (130-139/85-89 mm Hg). ? People who are overweight or obese. ? People who are African American.  If you are 90-31 years of age, have your blood pressure checked every 3-5 years. If you are 79 years of age or older, have your blood pressure checked every year. You should have your blood pressure measured twice-once when you are at a hospital or clinic, and once when you are not at a hospital or clinic. Record the average of the two measurements. To check your blood pressure when you are not at a hospital or clinic, you can use: ? An automated blood pressure machine at a pharmacy. ? A home blood  pressure monitor.  If you are between 61 years and 42 years old, ask your health care provider if you should take aspirin to prevent strokes.  Have regular diabetes screenings. This involves taking a blood sample to check your fasting blood sugar level. ? If you are at a normal weight and have a low risk for diabetes, have this test once every three years after 29 years of age. ? If you are overweight and have a high risk for diabetes, consider being tested at a younger age or more often. Preventing infection Hepatitis B  If you have a higher risk for hepatitis B, you should be screened for this virus. You are considered at high risk for hepatitis B if: ? You were born in a country where hepatitis B is common. Ask your health care provider which countries are considered high risk. ? Your parents were born in a high-risk country, and you have not been immunized against hepatitis B (hepatitis B vaccine). ? You have HIV or AIDS. ? You use needles to inject street drugs. ? You live with someone who has hepatitis B. ? You have had sex with someone who has hepatitis B. ? You get hemodialysis treatment. ? You take certain medicines for conditions, including cancer, organ transplantation, and autoimmune conditions.  Hepatitis C  Blood testing is recommended for: ? Everyone born from [birth year] through 1967. ? Anyone with known risk factors for hepatitis C.  Sexually transmitted infections (STIs)  You should be screened for sexually transmitted infections (STIs) including gonorrhea and chlamydia if: ? You are sexually active and are younger than 29 years of age. ? You are older than 29 years of age and your health care provider tells you that you are at risk for this type of infection. ? Your sexual activity has changed since you were last screened and you are at an increased risk for chlamydia or gonorrhea. Ask your health care provider if you are at risk.  If you do not have HIV, but are at risk,  it may be recommended that you take a prescription medicine daily to prevent HIV infection. This is called pre-exposure prophylaxis (PrEP). You are considered at risk if: ? You are sexually active and do not regularly use condoms or know the HIV status of your partner(s). ? You take drugs by injection. ? You are sexually active with a partner who has HIV.  Talk with your health care provider about whether you are at  high risk of being infected with HIV. If you choose to begin PrEP, you should first be tested for HIV. You should then be tested every 3 months for as long as you are taking PrEP. Pregnancy  If you are premenopausal and you may become pregnant, ask your health care provider about preconception counseling.  If you may become pregnant, take 400 to 800 micrograms (mcg) of folic acid every day.  If you want to prevent pregnancy, talk to your health care provider about birth control (contraception). Osteoporosis and menopause  Osteoporosis is a disease in which the bones lose minerals and strength with aging. This can result in serious bone fractures. Your risk for osteoporosis can be identified using a bone density scan.  If you are 82 years of age or older, or if you are at risk for osteoporosis and fractures, ask your health care provider if you should be screened.  Ask your health care provider whether you should take a calcium or vitamin D supplement to lower your risk for osteoporosis.  Menopause may have certain physical symptoms and risks.  Hormone replacement therapy may reduce some of these symptoms and risks. Talk to your health care provider about whether hormone replacement therapy is right for you. Follow these instructions at home:  Schedule regular health, dental, and eye exams.  Stay current with your immunizations.  Do not use any tobacco products including cigarettes, chewing tobacco, or electronic cigarettes.  If you are pregnant, do not drink  alcohol.  If you are breastfeeding, limit how much and how often you drink alcohol.  Limit alcohol intake to no more than 1 drink per day for nonpregnant women. One drink equals 12 ounces of beer, 5 ounces of wine, or 1 ounces of hard liquor.  Do not use street drugs.  Do not share needles.  Ask your health care provider for help if you need support or information about quitting drugs.  Tell your health care provider if you often feel depressed.  Tell your health care provider if you have ever been abused or do not feel safe at home. This information is not intended to replace advice given to you by your health care provider. Make sure you discuss any questions you have with your health care provider. Document Released: 11/04/2010 Document Revised: 09/27/2015 Document Reviewed: 01/23/2015 Elsevier Interactive Patient Education  [employer].

## 2018-02-24 LAB — CYTOLOGY - PAP: Diagnosis: NEGATIVE

## 2018-03-04 ENCOUNTER — Encounter: Payer: 59 | Admitting: Obstetrics and Gynecology

## 2018-03-23 DIAGNOSIS — M7989 Other specified soft tissue disorders: Secondary | ICD-10-CM | POA: Diagnosis not present

## 2018-03-23 DIAGNOSIS — M2550 Pain in unspecified joint: Secondary | ICD-10-CM | POA: Diagnosis not present

## 2018-03-29 ENCOUNTER — Encounter: Payer: Self-pay | Admitting: Family Medicine

## 2018-03-30 ENCOUNTER — Ambulatory Visit: Payer: 59 | Admitting: Obstetrics and Gynecology

## 2018-03-30 ENCOUNTER — Encounter: Payer: Self-pay | Admitting: Obstetrics and Gynecology

## 2018-03-30 VITALS — BP 130/79 | HR 86 | Ht 62.0 in | Wt 160.8 lb

## 2018-03-30 DIAGNOSIS — N760 Acute vaginitis: Secondary | ICD-10-CM | POA: Diagnosis not present

## 2018-03-30 DIAGNOSIS — N898 Other specified noninflammatory disorders of vagina: Secondary | ICD-10-CM | POA: Diagnosis not present

## 2018-03-30 NOTE — Progress Notes (Signed)
Pt stated that she is having vaginal itching and discharge x 4 days. No other issues.

## 2018-03-31 ENCOUNTER — Encounter: Payer: Self-pay | Admitting: Obstetrics and Gynecology

## 2018-03-31 NOTE — Progress Notes (Signed)
    GYNECOLOGY PROGRESS NOTE  Subjective:    Patient ID: Patricia Horn, female    DOB: [birth year], 27 y.o.   MRN: [id number]  HPI  Patient is a 27 y.o. G0P0000 female who presents for complaints of vaginal itching and discharge x 4 days.  She states that she has a history of recurrent vaginitis, requiring treatment approximately 3 times per year. She has had both BV and yeast infections in the past, but majority have been yeast. Her last infection (yeast) was in September, for which she was treated with boric acid capsules, however patient states that she did not feel that this really helped, so she took an OTC Monistat which had her feeling better in 1-2 days. She reports that most recently, she took a Monistat 1-day treatment as she felt like she was having another yeast infection. She denies possible STI exposure. Denies vaginal discharge.   The following portions of the patient's history were reviewed and updated as appropriate: allergies, current medications, past family history, past medical history, past social history, past surgical history and problem list.  Review of Systems Pertinent items noted in HPI and remainder of comprehensive ROS otherwise negative.   Objective:   Blood pressure 130/79, pulse 86, height 5\' 2"  (1.575 m), weight 160 lb 12.8 oz (72.9 kg). General appearance: alert and no distress Abdomen: soft, non-tender; bowel sounds normal; no masses,  no organomegaly Pelvic: external genitalia normal, rectovaginal septum normal.  Vagina with scant mucoid discharge. IUD threads visualized, ~ 3 cm in length.  Cervix normal appearing, no lesions and no motion tenderness.  Uterus mobile, nontender, normal shape and size.  Adnexae non-palpable, nontender bilaterally.    Labs:  Microscopic wet-mount exam shows negative for pathogens, normal epithelial cells.  Assessment:   Vaginal irritation H/o recurrent vaginitis  Plan:   No evidence of vaginal infection today.   Discussed prevention methods for recurrent vaginitis, including healthy diet, minimizing carbs and sugar, support immune system, taking probiotics, using gentle or pH balanced soaps or detergents, consuming more yogurt.  Patient notes that she is doing the majority of these things. Also informed her that the Monistat 1 has been know to cause vaginal itching and irritation after use.  Discussed that if vaginal infections continue, can consider suppression therapy. Encouraged use of Vagisil wipes, coconut oil or T-tree oil for vaginal symptoms.    Rubie Maid, MD [facility]

## 2018-04-16 ENCOUNTER — Encounter: Payer: Self-pay | Admitting: Obstetrics and Gynecology

## 2018-04-16 ENCOUNTER — Ambulatory Visit: Payer: 59 | Admitting: Obstetrics and Gynecology

## 2018-04-16 VITALS — BP 103/65 | HR 75 | Ht 62.0 in | Wt 158.6 lb

## 2018-04-16 DIAGNOSIS — N760 Acute vaginitis: Secondary | ICD-10-CM

## 2018-04-16 NOTE — Progress Notes (Signed)
Pt stated that she still has the vaginal itching from her last visit. Pt stated that it has improved some but the itching is still there. No other complaints.

## 2018-04-16 NOTE — Progress Notes (Signed)
    GYNECOLOGY PROGRESS NOTE  Subjective:    Patient ID: Patricia Horn, female    DOB: 1990-11-06, 27 y.o.   MRN: [id number]  HPI  Patient is a 27 y.o. G0P0000 female who presents for continued complaints of vaginal itching.  She has been treated in the past several times for a yeast infection, recently self-treated with Monistat 1 several weeks ago. Notes that she attempted use of the coconut oil which did not offer much relief.  Has now begun use of Tea-Tree oil (started 3 days ago), which has given her more relief, but still has not completely resolved her symptoms. Has also recently used a hair removal product in the past several days. Itching is mostly external, however does occasional feel an itch internally as well.   The following portions of the patient's history were reviewed and updated as appropriate: allergies, current medications, past family history, past medical history, past social history, past surgical history and problem list.  Review of Systems Pertinent items noted in HPI and remainder of comprehensive ROS otherwise negative.   Objective:   Blood pressure 103/65, pulse 75, height 5\' 2"  (1.575 m), weight 158 lb 9.6 oz (71.9 kg). General appearance: alert and no distress Pelvic: external genitalia normal, rectovaginal septum normal.  Vagina with scant thin mucoid yellow-white discharge.  Cervix normal appearing, no lesions and no motion tenderness.  IIUD threads visualized, ~ 2 cm in length.  Bimanual exam not performed.    Assessment:   Vulvovaginitis  Plan:   - Patient with history of recurrent vaginitis.  Will re-screen today and perform extended panel testing for yeast. Advised to continue use of Tea-Tree oil externally as she is getting more relief. To use for up to 1 week daily, and if symptoms persist, can alternate with Cortizone -10 cream (hydrocortisone + aloe vera).  Also advised to discontinue use of all hair removal products, shaving/trimming for several  weeks as these may be causing more irritation.  - Call or return to clinic prn if these symptoms worsen or fail to improve as anticipated.

## 2018-04-16 NOTE — Patient Instructions (Signed)
Can use Cortizone-10 (over the counter) to external vaginal region if Tea-Tree oil remains ineffective after use for 1 week.

## 2018-04-21 LAB — NUSWAB VAGINITIS PLUS (VG+)
CHLAMYDIA TRACHOMATIS, NAA: NEGATIVE
Candida albicans, NAA: NEGATIVE
Candida glabrata, NAA: NEGATIVE
NEISSERIA GONORRHOEAE, NAA: NEGATIVE
TRICH VAG BY NAA: NEGATIVE

## 2018-04-21 LAB — CANDIDA 6 SPECIES PROFILE, NAA
CANDIDA LUSITANIAE, NAA: POSITIVE — AB
Candida krusei, NAA: NEGATIVE
Candida parapsilosis, NAA: NEGATIVE
Candida tropicalis, NAA: NEGATIVE

## 2018-04-23 ENCOUNTER — Other Ambulatory Visit: Payer: Self-pay

## 2018-04-23 MED ORDER — TERCONAZOLE 0.4 % VA CREA: 1.0000 | TOPICAL_CREAM | Freq: Every day | VAGINAL | 0 refills | Status: DC

## 2018-04-23 MED ORDER — FLUCONAZOLE 150 MG PO TABS: 150.0000 mg | ORAL_TABLET | ORAL | 0 refills | Status: DC

## 2018-06-09 ENCOUNTER — Other Ambulatory Visit: Payer: Self-pay | Admitting: Family Medicine

## 2018-06-09 NOTE — Telephone Encounter (Signed)
Refill request for general medication: Diflucan 150 mg  Last office visit: 01/26/2018  Last physical exam: None indicated  Follow-ups on file. 06/11/2018

## 2018-06-11 ENCOUNTER — Encounter: Payer: Self-pay | Admitting: Family Medicine

## 2018-06-11 ENCOUNTER — Ambulatory Visit (INDEPENDENT_AMBULATORY_CARE_PROVIDER_SITE_OTHER): Payer: 59 | Admitting: Family Medicine

## 2018-06-11 VITALS — BP 102/60 | HR 98 | Temp 98.0°F | Resp 16 | Ht 63.0 in | Wt 157.9 lb

## 2018-06-11 DIAGNOSIS — F4321 Adjustment disorder with depressed mood: Secondary | ICD-10-CM

## 2018-06-11 DIAGNOSIS — F411 Generalized anxiety disorder: Secondary | ICD-10-CM

## 2018-06-11 DIAGNOSIS — M2550 Pain in unspecified joint: Secondary | ICD-10-CM

## 2018-06-11 DIAGNOSIS — R768 Other specified abnormal immunological findings in serum: Secondary | ICD-10-CM | POA: Diagnosis not present

## 2018-06-11 DIAGNOSIS — J3089 Other allergic rhinitis: Secondary | ICD-10-CM | POA: Diagnosis not present

## 2018-06-11 MED ORDER — AZELASTINE HCL 137 MCG/SPRAY NA SOLN: 2.0000 | Freq: Every day | NASAL | 1 refills | Status: DC

## 2018-06-11 MED ORDER — FLUTICASONE PROPIONATE 50 MCG/ACT NA SUSP: 2.0000 | Freq: Every day | NASAL | 1 refills | Status: DC

## 2018-06-11 MED ORDER — LEVOCETIRIZINE DIHYDROCHLORIDE 5 MG PO TABS: 5.0000 mg | ORAL_TABLET | Freq: Every evening | ORAL | 1 refills | Status: DC

## 2018-06-11 NOTE — Progress Notes (Signed)
Name: Patricia Horn   MRN: [id number]    DOB: Nov 12, 1990   Date:06/11/2018       Progress Note  Subjective  Chief Complaint  Chief Complaint  Patient presents with  . Annual Exam  . Allergic Rhinitis     Sneezing, coughing, runny nose  . Anxiety    HPI  She decided not to have CPE done because recently seen at gyn and had it in October  Vaginal discharge: no symptoms of itching or vaginal odor, explained likely her new normal, and gave it reassurance  AR: she is taking Xyzal daily, and nasal spray daily to control symptoms, recent flare with clear rhinorrhea, nasal congestion, but no pruritus. No cough or wheezing at this time  Overweight: she has been doing well, eating balanced meals, smaller portions, she is taking exercise classes, also goes to boot camp soon.   ANA positive/arthralgia: seen by Rheumatologist and is doing well, no recent episodes of hand pain , no swelling, she was given reassurance.   GAD and grieving: lost her mother last week, still having problems sleeping, seeing [name] therapist since October and it has been helpful. Grieving the loss of her dog also. She still has anxiety and it is helpful to her.    Patient Active Problem List   Diagnosis Date Noted  . Weight loss 02/23/2018  . Amenorrhea, Mirena IUD related 02/23/2018  . Bilateral hand pain 12/28/2017  . Right carpal tunnel syndrome 11/10/2016  . Urticaria 11/09/2015  . Obese 01/05/2015  . Umbilical cyst 21/30/8657  . Perennial allergic rhinitis 11/16/2014  . ANA positive 10/06/2014  . Anxiety 10/06/2014  . History of uterine fibroid 10/06/2014    Past Surgical History:  Procedure Laterality Date  . HERNIA REPAIR  8469   umbilical   . MYOMECTOMY  2015    Family History  Problem Relation Age of Onset  . Hypertension Mother   . Scleroderma Mother   . Pulmonary fibrosis Mother   . Asthma Mother   . Cancer Neg Hx     Social History   Socioeconomic History  . Marital status:  Single    Spouse name: Not on file  . Number of children: 0  . Years of education: Not on file  . Highest education level: Master's degree (e.g., MA, MS, MEng, MEd, MSW, MBA)  Occupational History  . 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 Financial resource strain: Not hard at all  . Food insecurity:    Worry: Never true    Inability: Never true  . Transportation needs:    Medical: No    Non-medical: No  Tobacco Use  . Smoking status: Never Smoker  . Smokeless tobacco: Never Used  Substance and Sexual Activity  . Alcohol use: Yes    Alcohol/week: 1.0 standard drinks    Types: 1 Standard drinks or equivalent per week    Comment: ocassional  . Drug use: No  . Sexual activity: Yes    Partners: Male    Birth control/protection: I.U.D., Condom  Lifestyle  . Physical activity:    Days per week: 2 days    Minutes per session: 60 min  . Stress: Very much  Relationships  . Social connections:    Talks on phone: More than three times a week    Gets together: Three times a week    Attends religious service: More than 4 times per year  Active member of club or organization: Yes    Attends meetings of clubs or organizations: More than 4 times per year    Relationship status: Never married  . Intimate partner violence:    Fear of current or ex partner: No    Emotionally abused: No    Physically abused: No    Forced sexual activity: No  Other Topics Concern  . 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      Current Outpatient Medications:  .  Azelastine HCl 137 MCG/SPRAY SOLN, Place 2 sprays into the nose daily., Disp: 90 mL, Rfl: 1 .  fluticasone (FLONASE) 50 MCG/ACT nasal spray, Place 2 sprays into both nostrils daily., Disp: 16 g, Rfl: 1 .  levocetirizine (XYZAL) 5 MG tablet, Take 1  tablet (5 mg total) by mouth every evening., Disp: 90 tablet, Rfl: 1 .  levonorgestrel (MIRENA) 20 MCG/24HR IUD, 1 Intra Uterine Device (1 each total) by Intrauterine route once., Disp: 1 each, Rfl: 0  Allergies  Allergen Reactions  . Peanut Oil Swelling    Eyelids swell.  . Peanut-Containing Drug Products     I personally reviewed active problem list, medication list, allergies, family history, social history with the patient/caregiver today.   ROS  Constitutional: Negative for fever or weight change.  Respiratory: Negative for cough and shortness of breath.   Cardiovascular: Negative for chest pain or palpitations.  Gastrointestinal: Negative for abdominal pain, no bowel changes.  Musculoskeletal: Negative for gait problem or joint swelling.  Skin: Negative for rash.  Neurological: Negative for dizziness or headache.  No other specific complaints in a complete review of systems (except as listed in HPI above).  Objective  Vitals:   06/11/18 1327  BP: 102/60  Pulse: 98  Resp: 16  Temp: 98 F (36.7 C)  TempSrc: Oral  SpO2: 99%  Weight: 157 lb 14.4 oz (71.6 kg)  Height: 5\' 3"  (1.6 m)    Body mass index is 27.97 kg/m.  Physical Exam  Constitutional: Patient appears well-developed and well-nourished. Overweight.  No distress.  HEENT: head atraumatic, normocephalic, pupils equal and reactive to light, boggy turbinates,  neck supple, throat within normal limits Cardiovascular: Normal rate, regular rhythm and normal heart sounds.  No murmur heard. No BLE edema. Pulmonary/Chest: Effort normal and breath sounds normal. No respiratory distress. Abdominal: Soft.  There is no tenderness. Psychiatric: Patient has a normal mood and affect. behavior is normal. Judgment and thought content normal.  Recent Results (from the past 2160 hour(s))  NuSwab Vaginitis Plus (VG+)     Status: None   Collection Time: 04/16/18  1:21 PM  Result Value Ref Range   Atopobium vaginae Low - 0  Score   BVAB 2 Low - 0 Score   Megasphaera 1 Low - 0 Score    Comment: Calculate total score by adding the 3 individual bacterial vaginosis (BV) marker scores together.  Total score is interpreted as follows: Total score 0-1: Indicates the absence of BV. Total score   2: Indeterminate for BV. Additional clinical                  data should be evaluated to establish a                  diagnosis. Total score 3-6: Indicates the presence of BV.  This test was developed and its performance characteristics determined by [facility].  It has not been cleared or approved by the Food and Drug Administration.  The FDA has determined that such clearance or approval is not necessary.    Candida albicans, NAA Negative Negative   Candida glabrata, NAA Negative Negative    Comment: This test was developed and its performance characteristics determined by [facility].  It has not been cleared or approved by the Food and Drug Administration.  The FDA has determined that such clearance or approval is not necessary.    Trich vag by NAA Negative Negative   Chlamydia trachomatis, NAA Negative Negative   Neisseria gonorrhoeae, NAA Negative Negative  Candida 6 Species Profile, NAA     Status: Abnormal   Collection Time: 04/16/18  1:21 PM  Result Value Ref Range   Candida tropicalis, NAA Negative Negative   Candida parapsilosis, NAA Negative Negative   Candida lusitaniae, NAA Positive (A) Negative   Candida krusei, NAA Negative Negative    Comment: This test was developed and its performance characteristics determined by [facility].  It has not been cleared or approved by the Food and Drug Administration.  The FDA has determined that such clearance or approval is not necessary.       PHQ2/9: Depression screen [facility] 2/9 06/11/2018 01/26/2018 12/07/2017 06/26/2016 11/09/2015  Decreased Interest 0 0 0 0 0  Down, Depressed, Hopeless 2 0 0 0 0  PHQ - 2 Score 2 0 0 0 0  Altered sleeping 3 3 - - -  Tired, decreased  energy 1 3 - - -  Change in appetite 0 1 - - -  Feeling bad or failure about yourself  0 0 - - -  Trouble concentrating 0 1 - - -  Moving slowly or fidgety/restless 0 0 - - -  Suicidal thoughts 0 0 - - -  PHQ-9 Score 6 8 - - -  Difficult doing work/chores Somewhat difficult Somewhat difficult - - -     Fall Risk: Fall Risk  06/11/2018 01/26/2018 12/07/2017 06/26/2016 11/09/2015  Falls in the past year? 0 No No No No     Functional Status Survey: Is the patient deaf or have difficulty hearing?: No Does the patient have difficulty seeing, even when wearing glasses/contacts?: No Does the patient have difficulty concentrating, remembering, or making decisions?: No Does the patient have difficulty walking or climbing stairs?: No Does the patient have difficulty dressing or bathing?: No Does the patient have difficulty doing errands alone such as visiting a doctor's office or shopping?: No    Assessment & Plan  1. Perennial allergic rhinitis  - levocetirizine (XYZAL) 5 MG tablet; Take 1 tablet (5 mg total) by mouth every evening.  Dispense: 90 tablet; Refill: 1 - fluticasone (FLONASE) 50 MCG/ACT nasal spray; Place 2 sprays into both nostrils daily.  Dispense: 16 g; Refill: 1 - Azelastine HCl 137 MCG/SPRAY SOLN; Place 2 sprays into the nose daily.  Dispense: 90 mL; Refill: 1  2. ANA positive   3. Arthralgia, unspecified joint  Seen by Dr. Meda Coffee, given reassurance, thinking about cancelling appointment and going prn  4. Grieving   5. Anxiety, generalized  Grieving a little

## 2018-07-01 ENCOUNTER — Ambulatory Visit: Payer: 59 | Admitting: Obstetrics and Gynecology

## 2018-07-01 ENCOUNTER — Encounter: Payer: Self-pay | Admitting: Obstetrics and Gynecology

## 2018-07-01 VITALS — BP 89/56 | HR 84 | Ht 62.0 in | Wt 159.7 lb

## 2018-07-01 DIAGNOSIS — N760 Acute vaginitis: Secondary | ICD-10-CM

## 2018-07-01 NOTE — Progress Notes (Signed)
    GYNECOLOGY PROGRESS NOTE  Subjective:    Patient ID: Patricia Horn, female    DOB: 1991-01-23, 28 y.o.   MRN: [id number]  HPI  Patient is a 28 y.o. G0P0000 female with a history of recurrent vaginitis (usually yeast infections) who presents for follow up.  Patient notes that her symptoms have mostly resolved with the use of OTC agents previously recommended (including Tea Tree oil vaginal suppositories and external use, alternating with Cortizone/aloe vera.  She just wants to make sure that she is not having a recurrence.  Denies any vaginal itching and burning but does note vaginal discharge.   The following portions of the patient's history were reviewed and updated as appropriate: allergies, current medications, past family history, past medical history, past social history, past surgical history and problem list.  Review of Systems Pertinent items noted in HPI and remainder of comprehensive ROS otherwise negative.   Objective:   Blood pressure (!) 89/56, pulse 84, height 5\' 2"  (1.575 m), weight 159 lb 11.2 oz (72.4 kg). General appearance: alert and no distress Pelvic: external genitalia normal, rectovaginal septum normal.  Vagina with scant yellow-white thin discharge.  Cervix normal appearing, no lesions and no motion tenderness.  Uterus mobile, nontender, normal shape and size.  Adnexae non-palpable, nontender bilaterally.    Microscopic wet-mount exam shows no clue cells, no hyphae, no trichomonads, no white blood cells. KOH done.    Assessment:   History of recurrent vaginitis  Plan:   Patient given reassurance that no infection is ongoing. Encouraged to continue to use natural remedies for symptoms, and will treat if an infection arises.  Has used several treatments in the past including boric acid which caused side effects.   Rubie Maid, MD [facility]

## 2018-07-01 NOTE — Progress Notes (Signed)
Pt is present today for a follow up to see if the medication she took cleaned up her yeast infection. Pt stated that she is still having some vaginal discharge.

## 2018-08-18 ENCOUNTER — Other Ambulatory Visit: Payer: Self-pay | Admitting: Obstetrics and Gynecology

## 2018-08-20 ENCOUNTER — Encounter: Payer: Self-pay | Admitting: Family Medicine

## 2018-08-23 ENCOUNTER — Other Ambulatory Visit: Payer: Self-pay | Admitting: Surgical

## 2018-08-23 MED ORDER — FLUCONAZOLE 150 MG PO TABS: 150.0000 mg | ORAL_TABLET | Freq: Every day | ORAL | 0 refills | Status: DC

## 2018-08-23 NOTE — Telephone Encounter (Signed)
No, she can have a refill.

## 2018-08-29 ENCOUNTER — Encounter: Payer: Self-pay | Admitting: Family Medicine

## 2018-08-30 ENCOUNTER — Other Ambulatory Visit: Payer: Self-pay

## 2018-08-30 ENCOUNTER — Ambulatory Visit (INDEPENDENT_AMBULATORY_CARE_PROVIDER_SITE_OTHER): Payer: 59 | Admitting: Family Medicine

## 2018-08-30 ENCOUNTER — Encounter: Payer: Self-pay | Admitting: Family Medicine

## 2018-08-30 DIAGNOSIS — J3089 Other allergic rhinitis: Secondary | ICD-10-CM

## 2018-08-30 MED ORDER — LORATADINE 10 MG PO TABS: 10.0000 mg | ORAL_TABLET | Freq: Every day | ORAL | 0 refills | Status: DC

## 2018-08-30 MED ORDER — MONTELUKAST SODIUM 10 MG PO TABS: 10.0000 mg | ORAL_TABLET | Freq: Every day | ORAL | 0 refills | Status: DC

## 2018-08-30 NOTE — Progress Notes (Signed)
Name: Patricia Horn   MRN: [id number]    DOB: 01-23-1991   Date:08/30/2018       Progress Note  Subjective  Chief Complaint  Chief Complaint  Patient presents with  . URI  . Otalgia    I connected with  [name]  on 08/30/18 at  3:40 PM EDT by a video enabled telemedicine application and verified that I am speaking with the correct person using two identifiers.  I discussed the limitations of evaluation and management by telemedicine and the availability of in person appointments. The patient expressed understanding and agreed to proceed. Staff also discussed with the patient that there may be a patient responsible charge related to this service. Patient Location: at home  Provider Location: [facility]   HPI  AR: she is taking Xyzal daily, and using nasal sprays as prescribed, however symptoms have been worse recently, she states she has seen ENT int he past for ear fulness and it responded to nasal steroids in the past but not working this time around. No fever or chills, denies dizziness or vertigo. She has nasal congestion and post-nasal drainage that is worse in the mornings.   Patient Active Problem List   Diagnosis Date Noted  . Weight loss 02/23/2018  . Amenorrhea, Mirena IUD related 02/23/2018  . Bilateral hand pain 12/28/2017  . Right carpal tunnel syndrome 11/10/2016  . Urticaria 11/09/2015  . Obese 01/05/2015  . Umbilical cyst 08/67/6195  . Perennial allergic rhinitis 11/16/2014  . ANA positive 10/06/2014  . Anxiety 10/06/2014  . History of uterine fibroid 10/06/2014    Past Surgical History:  Procedure Laterality Date  . HERNIA REPAIR  0932   umbilical   . MYOMECTOMY  2015    Family History  Problem Relation Age of Onset  . Hypertension Mother   . Scleroderma Mother   . Pulmonary fibrosis Mother   . Asthma Mother   . Cancer Neg Hx     Social History   Socioeconomic History  . Marital status: Single    Spouse name: Not on  file  . Number of children: 0  . Years of education: Not on file  . Highest education level: Master's degree (e.g., MA, MS, MEng, MEd, MSW, MBA)  Occupational History  . 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 Financial resource strain: Not hard at all  . Food insecurity:    Worry: Never true    Inability: Never true  . Transportation needs:    Medical: No    Non-medical: No  Tobacco Use  . Smoking status: Never Smoker  . Smokeless tobacco: Never Used  Substance and Sexual Activity  . Alcohol use: Yes    Alcohol/week: 1.0 standard drinks    Types: 1 Standard drinks or equivalent per week    Comment: ocassional  . Drug use: No  . Sexual activity: Yes    Partners: Male    Birth control/protection: I.U.D., Condom  Lifestyle  . Physical activity:    Days per week: 2 days    Minutes per session: 60 min  . Stress: Very much  Relationships  . Social connections:    Talks on phone: More than three times a week    Gets together: Three times a week    Attends religious service: More than 4 times per year    Active member of club or organization: Yes  Attends meetings of clubs or organizations: More than 4 times per year    Relationship status: Never married  . Intimate partner violence:    Fear of current or ex partner: No    Emotionally abused: No    Physically abused: No    Forced sexual activity: No  Other Topics Concern  . 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      Current Outpatient Medications:  .  Azelastine HCl 137 MCG/SPRAY SOLN, Place 2 sprays into the nose daily., Disp: 90 mL, Rfl: 1 .  fluticasone (FLONASE) 50 MCG/ACT nasal spray, Place 2 sprays into both nostrils daily., Disp: 16 g, Rfl: 1 .  levocetirizine (XYZAL) 5 MG tablet, Take 1 tablet (5 mg total) by mouth every  evening., Disp: 90 tablet, Rfl: 1 .  levonorgestrel (MIRENA) 20 MCG/24HR IUD, 1 Intra Uterine Device (1 each total) by Intrauterine route once., Disp: 1 each, Rfl: 0 .  fluconazole (DIFLUCAN) 150 MG tablet, Take 1 tablet (150 mg total) by mouth daily. (Patient not taking: Reported on 08/30/2018), Disp: 1 tablet, Rfl: 0  Allergies  Allergen Reactions  . Peanut Oil Swelling    Eyelids swell.  . Peanut-Containing Drug Products     I personally reviewed active problem list, medication list, allergies, family history with the patient/caregiver today.   ROS  Ten systems reviewed and is negative except as mentioned in HPI   Objective  Virtual encounter, vitals not obtained.   Physical Exam  Awake, alert and oriented, in no acute distress   PHQ2/9: Depression screen [facility] 2/9 08/30/2018 06/11/2018 01/26/2018 12/07/2017 06/26/2016  Decreased Interest 0 0 0 0 0  Down, Depressed, Hopeless 0 2 0 0 0  PHQ - 2 Score 0 2 0 0 0  Altered sleeping 0 3 3 - -  Tired, decreased energy 0 1 3 - -  Change in appetite 0 0 1 - -  Feeling bad or failure about yourself  0 0 0 - -  Trouble concentrating 0 0 1 - -  Moving slowly or fidgety/restless 0 0 0 - -  Suicidal thoughts 0 0 0 - -  PHQ-9 Score 0 6 8 - -  Difficult doing work/chores Not difficult at all Somewhat difficult Somewhat difficult - -   PHQ-2/9 Result is negative.    Fall Risk: Fall Risk  08/30/2018 06/11/2018 01/26/2018 12/07/2017 06/26/2016  Falls in the past year? 0 0 No No No  Number falls in past yr: 0 - - - -  Injury with Fall? 0 - - - -  Follow up Falls evaluation completed - - - -     Assessment & Plan  1. Perennial allergic rhinitis  - montelukast (SINGULAIR) 10 MG tablet; Take 1 tablet (10 mg total) by mouth at bedtime.  Dispense: 90 tablet; Refill: 0 - loratadine (CLARITIN) 10 MG tablet; Take 1 tablet (10 mg total) by mouth daily.  Dispense: 90 tablet; Refill: 0  I discussed the assessment and treatment plan with the patient.  The patient was provided an opportunity to ask questions and all were answered. The patient agreed with the plan and demonstrated an understanding of the instructions.  The patient was advised to call back or seek an in-person evaluation if the symptoms worsen or if the condition fails to improve as anticipated.  I provided  15 minutes of non-face-to-face time during this encounter.

## 2018-09-01 ENCOUNTER — Ambulatory Visit: Payer: 59 | Admitting: Family Medicine

## 2018-09-19 ENCOUNTER — Encounter: Payer: Self-pay | Admitting: Family Medicine

## 2018-09-23 ENCOUNTER — Other Ambulatory Visit: Payer: Self-pay

## 2018-09-23 ENCOUNTER — Ambulatory Visit (INDEPENDENT_AMBULATORY_CARE_PROVIDER_SITE_OTHER): Payer: 59 | Admitting: Family Medicine

## 2018-09-23 ENCOUNTER — Encounter: Payer: Self-pay | Admitting: Family Medicine

## 2018-09-23 VITALS — Temp 97.5°F | Ht 63.0 in | Wt 157.8 lb

## 2018-09-23 DIAGNOSIS — F341 Dysthymic disorder: Secondary | ICD-10-CM

## 2018-09-23 DIAGNOSIS — F5104 Psychophysiologic insomnia: Secondary | ICD-10-CM

## 2018-09-23 DIAGNOSIS — F411 Generalized anxiety disorder: Secondary | ICD-10-CM

## 2018-09-23 MED ORDER — HYDROXYZINE HCL 10 MG PO TABS: 10.0000 mg | ORAL_TABLET | Freq: Three times a day (TID) | ORAL | 0 refills | Status: DC | PRN

## 2018-09-23 MED ORDER — DULOXETINE HCL 30 MG PO CPEP: 30.0000 mg | ORAL_CAPSULE | ORAL | 0 refills | Status: DC

## 2018-09-23 NOTE — Progress Notes (Signed)
Name: Patricia Horn   MRN: [id number]    DOB: October 04, 1990   Date:09/23/2018       Progress Note  Subjective  Chief Complaint  Chief Complaint  Patient presents with  . Anxiety    I connected with  Patricia Horn  on 09/23/18 at 11:20 AM EDT by a video enabled telemedicine application and verified that I am speaking with the correct person using two identifiers.  I discussed the limitations of evaluation and management by telemedicine and the availability of in person appointments. The patient expressed understanding and agreed to proceed. Staff also discussed with the patient that there may be a patient responsible charge related to this service. Patient Location: at home Provider Location: [facility]   HPI  Depression/GAD: she has a long history of anxiety and also episodes of dysthymia in the past. She was doing well previous to COVID-19, she uses exercise as a form of dealing with anxiety, however when gym closed she has been working from home and not as active. She sees a counselor [name] that has been very helpful.  However recently she realized she has been drinking wine to help her sleep ( difficulty falling and staying asleep) also has panic attacks a few times a day that can take 30 minutes to resolve. She has taken Citalopram in the past but did not like it because of weight gain. Discussed options and we will try going on Duloxetine and hydroxyzine prn. Discussed possible side effects. She has been under a lot of stress. Lost her dog last year, followed by her mother early 2020, bought a house in 2020, turned 45 and has been working from home with very little social interaction because of [place] -19.     Patient Active Problem List   Diagnosis Date Noted  . Amenorrhea, Mirena IUD related 02/23/2018  . Bilateral hand pain 12/28/2017  . Right carpal tunnel syndrome 11/10/2016  . Urticaria 11/09/2015  . Obese 01/05/2015  . Umbilical cyst 81/27/5170  .  Perennial allergic rhinitis 11/16/2014  . ANA positive 10/06/2014  . Anxiety 10/06/2014  . History of uterine fibroid 10/06/2014    Past Surgical History:  Procedure Laterality Date  . HERNIA REPAIR  0174   umbilical   . MYOMECTOMY  2015    Family History  Problem Relation Age of Onset  . Hypertension Mother   . Scleroderma Mother   . Pulmonary fibrosis Mother   . Asthma Mother   . Cancer Neg Hx     Social History   Socioeconomic History  . Marital status: Single    Spouse name: Not on file  . Number of children: 0  . Years of education: Not on file  . Highest education level: Master's degree (e.g., MA, MS, MEng, MEd, MSW, MBA)  Occupational History  . 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 Financial resource strain: Not hard at all  . Food insecurity:    Worry: Never true    Inability: Never true  . Transportation needs:    Medical: No    Non-medical: No  Tobacco Use  . Smoking status: Never Smoker  . Smokeless tobacco: Never Used  Substance and Sexual Activity  . Alcohol use: Yes    Alcohol/week: 1.0 standard drinks    Types: 1 Standard drinks or equivalent per week    Comment: ocassional  . Drug use: No  . Sexual  activity: Yes    Partners: Male    Birth control/protection: I.U.D., Condom  Lifestyle  . Physical activity:    Days per week: 2 days    Minutes per session: 60 min  . Stress: Very much  Relationships  . Social connections:    Talks on phone: More than three times a week    Gets together: Three times a week    Attends religious service: More than 4 times per year    Active member of club or organization: Yes    Attends meetings of clubs or organizations: More than 4 times per year    Relationship status: Never married  . Intimate partner violence:    Fear of current or ex partner: No    Emotionally abused: No    Physically abused: No    Forced sexual activity: No  Other Topics  Concern  . 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      Current Outpatient Medications:  .  Azelastine HCl 137 MCG/SPRAY SOLN, Place 2 sprays into the nose daily., Disp: 90 mL, Rfl: 1 .  fluticasone (FLONASE) 50 MCG/ACT nasal spray, Place 2 sprays into both nostrils daily., Disp: 16 g, Rfl: 1 .  levocetirizine (XYZAL) 5 MG tablet, Take 1 tablet (5 mg total) by mouth every evening., Disp: 90 tablet, Rfl: 1 .  levonorgestrel (MIRENA) 20 MCG/24HR IUD, 1 Intra Uterine Device (1 each total) by Intrauterine route once., Disp: 1 each, Rfl: 0 .  loratadine (CLARITIN) 10 MG tablet, Take 1 tablet (10 mg total) by mouth daily., Disp: 90 tablet, Rfl: 0 .  montelukast (SINGULAIR) 10 MG tablet, Take 1 tablet (10 mg total) by mouth at bedtime., Disp: 90 tablet, Rfl: 0  Allergies  Allergen Reactions  . Peanut Oil Swelling    Eyelids swell.  . Peanut-Containing Drug Products     I personally reviewed active problem list, medication list, allergies, family history, social history with the patient/caregiver today.   ROS  Ten systems reviewed and is negative except as mentioned in HPI   Objective  Virtual encounter, vitals not obtained. Normal mood and affect, well groomed   Body mass index is 27.95 kg/m.  Physical Exam  Awake, alert and oriented   PHQ2/9: Depression screen [facility] 2/9 09/23/2018 08/30/2018 06/11/2018 01/26/2018 12/07/2017  Decreased Interest 1 0 0 0 0  Down, Depressed, Hopeless 1 0 2 0 0  PHQ - 2 Score 2 0 2 0 0  Altered sleeping 3 0 3 3 -  Tired, decreased energy 2 0 1 3 -  Change in appetite 0 0 0 1 -  Feeling bad or failure about yourself  0 0 0 0 -  Trouble concentrating 0 0 0 1 -  Moving slowly or fidgety/restless 0 0 0 0 -  Suicidal thoughts 0 0 0 0 -  PHQ-9 Score 7 0 6 8 -  Difficult doing work/chores Not difficult at all Not difficult  at all Somewhat difficult Somewhat difficult -   PHQ-2/9 Result is positive.    GAD 7 : Generalized Anxiety Score 09/23/2018 06/11/2018 01/26/2018 05/10/2015  Nervous, Anxious, on Edge 3 1 3 1   Control/stop worrying 3 3 3 3   Worry too much - different things 3 3 3 3   Trouble relaxing 1 0 2 0  Restless 0 0 1 0  Easily annoyed or irritable 1 1 3 3   Afraid - awful might happen 2 0 3 0  Total GAD 7 Score 13 8 18 10   Anxiety Difficulty Not difficult at all Somewhat difficult Somewhat difficult Somewhat difficult     Fall Risk: Fall Risk  08/30/2018 06/11/2018 01/26/2018 12/07/2017 06/26/2016  Falls in the past year? 0 0 No No No  Number falls in past yr: 0 - - - -  Injury with Fall? 0 - - - -  Follow up Falls evaluation completed - - - -     Assessment & Plan  1. Dysthymia  - DULoxetine (CYMBALTA) 30 MG capsule; Take 1 capsule (30 mg total) by mouth every morning. First week after that 2 daily  Dispense: 60 capsule; Refill: 0  2. GAD (generalized anxiety disorder)  - DULoxetine (CYMBALTA) 30 MG capsule; Take 1 capsule (30 mg total) by mouth every morning. First week after that 2 daily  Dispense: 60 capsule; Refill: 0 - hydrOXYzine (ATARAX/VISTARIL) 10 MG tablet; Take 1-2 tablets (10-20 mg total) by mouth 3 (three) times daily as needed.  Dispense: 90 tablet; Refill: 0  3. Psychophysiological insomnia  Advised her to stop drinking wine for sleep - hydrOXYzine (ATARAX/VISTARIL) 10 MG tablet; Take 1-2 tablets (10-20 mg total) by mouth 3 (three) times daily as needed.  Dispense: 90 tablet; Refill: 0  I discussed the assessment and treatment plan with the patient. The patient was provided an opportunity to ask questions and all were answered. The patient agreed with the plan and demonstrated an understanding of the instructions.  The patient was advised to call back or seek an in-person evaluation if the symptoms worsen or if the condition fails to improve as anticipated.  I provided 25  minutes of non-face-to-face time during this encounter.

## 2018-09-29 ENCOUNTER — Encounter: Payer: Self-pay | Admitting: Family Medicine

## 2018-09-29 ENCOUNTER — Other Ambulatory Visit: Payer: Self-pay | Admitting: Family Medicine

## 2018-09-29 DIAGNOSIS — F341 Dysthymic disorder: Secondary | ICD-10-CM

## 2018-09-29 DIAGNOSIS — F411 Generalized anxiety disorder: Secondary | ICD-10-CM

## 2018-09-29 MED ORDER — DULOXETINE HCL 60 MG PO CPEP: 60.0000 mg | ORAL_CAPSULE | ORAL | 0 refills | Status: DC

## 2018-10-01 ENCOUNTER — Ambulatory Visit: Payer: 59 | Admitting: Obstetrics and Gynecology

## 2018-10-01 ENCOUNTER — Other Ambulatory Visit (HOSPITAL_COMMUNITY)
Admission: RE | Admit: 2018-10-01 | Discharge: 2018-10-01 | Disposition: A | Discharge status: Home / Self Care | Payer: 59 | Source: Ambulatory Visit | Attending: Obstetrics and Gynecology | Admitting: Obstetrics and Gynecology

## 2018-10-01 ENCOUNTER — Encounter: Payer: Self-pay | Admitting: Obstetrics and Gynecology

## 2018-10-01 ENCOUNTER — Other Ambulatory Visit: Payer: Self-pay

## 2018-10-01 VITALS — BP 107/80 | HR 92 | Ht 63.0 in | Wt 160.8 lb

## 2018-10-01 DIAGNOSIS — Z113 Encounter for screening for infections with a predominantly sexual mode of transmission: Secondary | ICD-10-CM | POA: Insufficient documentation

## 2018-10-01 DIAGNOSIS — R102 Pelvic and perineal pain: Secondary | ICD-10-CM | POA: Diagnosis not present

## 2018-10-01 DIAGNOSIS — T8332XA Displacement of intrauterine contraceptive device, initial encounter: Secondary | ICD-10-CM | POA: Diagnosis not present

## 2018-10-01 NOTE — Progress Notes (Signed)
    GYNECOLOGY PROGRESS NOTE  Subjective:    Patient ID: Patricia Horn, female    DOB: 1990-11-16, 28 y.o.   MRN: [id number]  HPI  Patient is a 28 y.o. G10P0000 female who presents for complaints of pelvic pain for 2 weeks.  She notes that had had left-sided pain x 1 day after intercourse that was sharp in late April, followed then 2 weeks later by right-sided pain that was more dull, achy, and intermittent.  Spotting is random, has happened several times over the past several weeks. She has a Mirena IUD in place (inserted ~ 3 years ago).   Patient notes that she is sexually active, 2 partners (both female).  Reports use of condoms consistently with 1 partner but intermittently with the other.    The following portions of the patient's history were reviewed and updated as appropriate: allergies, current medications, past family history, past medical history, past social history, past surgical history and problem list.  Review of Systems Pertinent items noted in HPI and remainder of comprehensive ROS otherwise negative.   Objective:   Blood pressure 107/80, pulse 92, height 5\' 3"  (1.6 m), weight 160 lb 12.8 oz (72.9 kg). General appearance: alert and no distress Abdomen: soft, non-tender; bowel sounds normal; no masses,  no organomegaly Pelvic: external genitalia normal, rectovaginal septum normal.  Vagina with small amount of thin white discharge, no odor.  Cervix normal appearing, no lesions and no motion tenderness.  Uterus mobile, nontender, normal shape and size.  Adnexae non-palpable, nontender bilaterally.    Assessment:   Pelvic pain Intrauterine contraceptive device threads lost, initial encounter  Screen for STD (sexually transmitted disease)  Plan:   1. Pelvic pain - no obvious identifiable source.  Discussed possible etiologies including ovarian cyst, migrated IUD, infection. Will order pelvic ultrasound to r/o ovarian cyst and assess location of IUD.  2. IUD threads not visible  today. Patient reports a history of shortened IUD threads. Will assess via ultrasound.  3. Patient with two sexual partners, uses condoms inconsistently with 1. Discussed STD screening, patient ok to perform.  Will perform Nuswab. Encourage safe sex practices.    Rubie Maid, MD [facility]

## 2018-10-01 NOTE — Progress Notes (Signed)
Pt is present today due ot having pain in her right side x 2 weeks, pain and spotting with intercourse.

## 2018-10-05 ENCOUNTER — Telehealth: Payer: Self-pay

## 2018-10-05 LAB — CERVICOVAGINAL ANCILLARY ONLY
Bacterial vaginitis: NEGATIVE
Candida vaginitis: NEGATIVE
Chlamydia: NEGATIVE
Neisseria Gonorrhea: NEGATIVE
Trichomonas: NEGATIVE

## 2018-10-05 NOTE — Telephone Encounter (Signed)
Coronavirus (COVID-19) Are you at risk?  Are you at risk for the Coronavirus (COVID-19)?  To be considered HIGH RISK for Coronavirus (COVID-19), you have to meet the following criteria:  . Traveled to [place], [place], [place], [place] or [place]; or in the [place] to [place], [place], [place], or [place]; and have fever, cough, and shortness of breath within the last 2 weeks of travel OR . Been in close contact with a person diagnosed with COVID-19 within the last 2 weeks and have fever, cough, and shortness of breath . IF YOU DO NOT MEET THESE CRITERIA, YOU ARE CONSIDERED LOW RISK FOR COVID-19.  What to do if you are HIGH RISK for COVID-19?  . If you are having a medical emergency, call 911. . Seek medical care right away. Before you go to a doctor's office, urgent care or emergency department, call ahead and tell them about your recent travel, contact with someone diagnosed with COVID-19, and your symptoms. You should receive instructions from your physician's office regarding next steps of care.  . When you arrive at healthcare provider, tell the healthcare staff immediately you have returned from visiting [place], [place], [place], [place] or [place]; or traveled in the [place] to [place], [place], [place], or [place]; in the last two weeks or you have been in close contact with a person diagnosed with COVID-19 in the last 2 weeks.   . Tell the health care staff about your symptoms: fever, cough and shortness of breath. . After you have been seen by a medical provider, you will be either: o Tested for (COVID-19) and discharged home on quarantine except to seek medical care if symptoms worsen, and asked to  - Stay home and avoid contact with others until you get your results (4-5 days)  - Avoid travel on public transportation if possible (such as bus, train, or airplane) or o Sent to the Emergency Department by EMS for evaluation, COVID-19 testing, and possible  admission depending on your condition and test results.  What to do if you are LOW RISK for COVID-19?  Reduce your risk of any infection by using the same precautions used for avoiding the common cold or flu:  . Wash your hands often with soap and warm water for at least 20 seconds.  If soap and water are not readily available, use an alcohol-based hand sanitizer with at least 60% alcohol.  . If coughing or sneezing, cover your mouth and nose by coughing or sneezing into the elbow areas of your shirt or coat, into a tissue or into your sleeve (not your hands). . Avoid shaking hands with others and consider head nods or verbal greetings only. . Avoid touching your eyes, nose, or mouth with unwashed hands.  . Avoid close contact with people who are sick. . Avoid places or events with large numbers of people in one location, like concerts or sporting events. . Carefully consider travel plans you have or are making. . If you are planning any travel outside or inside the [place], visit the CDC's Travelers' Health webpage for the latest health notices. . If you have some symptoms but not all symptoms, continue to monitor at home and seek medical attention if your symptoms worsen. . If you are having a medical emergency, call 911.   ADDITIONAL HEALTHCARE OPTIONS FOR PATIENTS  [facility] / e-Visit: [email or web address]         [facility]: [phone]  [facility]: [phone]                   [facility]: [phone]   Pre-screen negative, DM.   

## 2018-10-06 ENCOUNTER — Ambulatory Visit (INDEPENDENT_AMBULATORY_CARE_PROVIDER_SITE_OTHER): Payer: 59

## 2018-10-06 ENCOUNTER — Other Ambulatory Visit: Payer: Self-pay

## 2018-10-06 DIAGNOSIS — R102 Pelvic and perineal pain unspecified side: Secondary | ICD-10-CM

## 2018-10-06 DIAGNOSIS — T8332XA Displacement of intrauterine contraceptive device, initial encounter: Secondary | ICD-10-CM

## 2018-10-15 ENCOUNTER — Other Ambulatory Visit: Payer: Self-pay

## 2018-10-15 MED ORDER — NORETHIN ACE-ETH ESTRAD-FE 1-20 MG-MCG PO TABS: 1.0000 | ORAL_TABLET | Freq: Every day | ORAL | 11 refills | Status: DC

## 2018-11-06 ENCOUNTER — Other Ambulatory Visit: Payer: Self-pay | Admitting: Family Medicine

## 2018-11-06 DIAGNOSIS — F411 Generalized anxiety disorder: Secondary | ICD-10-CM

## 2018-11-06 DIAGNOSIS — F341 Dysthymic disorder: Secondary | ICD-10-CM

## 2018-11-08 ENCOUNTER — Other Ambulatory Visit: Payer: Self-pay | Admitting: Family Medicine

## 2018-11-08 DIAGNOSIS — F341 Dysthymic disorder: Secondary | ICD-10-CM

## 2018-11-08 DIAGNOSIS — F411 Generalized anxiety disorder: Secondary | ICD-10-CM

## 2018-11-08 MED ORDER — DULOXETINE HCL 60 MG PO CPEP: 60.0000 mg | ORAL_CAPSULE | ORAL | 0 refills | Status: DC

## 2018-11-09 NOTE — Telephone Encounter (Signed)
lvm for pt to call and schedule an appt °

## 2018-11-11 ENCOUNTER — Encounter: Payer: Self-pay | Admitting: Family Medicine

## 2018-11-11 ENCOUNTER — Other Ambulatory Visit: Payer: Self-pay

## 2018-11-11 ENCOUNTER — Ambulatory Visit (INDEPENDENT_AMBULATORY_CARE_PROVIDER_SITE_OTHER): Payer: 59 | Admitting: Family Medicine

## 2018-11-11 DIAGNOSIS — F341 Dysthymic disorder: Secondary | ICD-10-CM

## 2018-11-11 DIAGNOSIS — F411 Generalized anxiety disorder: Secondary | ICD-10-CM | POA: Diagnosis not present

## 2018-11-11 MED ORDER — DULOXETINE HCL 60 MG PO CPEP: 60.0000 mg | ORAL_CAPSULE | ORAL | 1 refills | Status: DC

## 2018-11-11 NOTE — Progress Notes (Signed)
Name: Patricia Horn   MRN: [id number]    DOB: Feb 01, 1991   Date:11/11/2018       Progress Note  Subjective  Chief Complaint  Chief Complaint  Patient presents with  . Medication Refill  . Follow-up    I connected with  Patricia Horn  on 11/11/18 at  1:40 PM EDT by a video enabled telemedicine application and verified that I am speaking with the correct person using two identifiers.  I discussed the limitations of evaluation and management by telemedicine and the availability of in person appointments. The patient expressed understanding and agreed to proceed. Staff also discussed with the patient that there may be a patient responsible charge related to this service. Patient Location: Home Provider Location: Office Additional Individuals present: None  HPI  Depression/GAD: she has a long history of anxiety and also episodes of dysthymia in the past. She was doing well previous to COVID-19, she uses exercise as a form of dealing with anxiety, however when gym closed she has been working from home and not as active. She was seen by Dr. Ancil Boozer her PCP in May 2020 and started on Cymbalta 60mg  which has worked very well to decrease her anxiety.  Has not needed hydroxyzine since last visit.  She sees a counselor Patricia Horn that has been very helpful - has continued this.  However recently she realized she has been drinking wine to help her sleep ( difficulty falling and staying asleep) also has panic attacks a few times a day that can take 30 minutes to resolve. She has taken Citalopram in the past but did not like it because of weight gain. She has been under a lot of stress. Lost her dog last year, followed by her mother early 2020, bought a house in 2020, turned 44 and has been working from home with very little social interaction because of [place] -19. Denies SI/HI  Patient Active Problem List   Diagnosis Date Noted  . Amenorrhea, Mirena IUD related 02/23/2018  . Bilateral hand pain  12/28/2017  . Right carpal tunnel syndrome 11/10/2016  . Urticaria 11/09/2015  . Obese 01/05/2015  . Umbilical cyst 82/42/3536  . Perennial allergic rhinitis 11/16/2014  . ANA positive 10/06/2014  . Anxiety 10/06/2014  . History of uterine fibroid 10/06/2014    Past Surgical History:  Procedure Laterality Date  . HERNIA REPAIR  1443   umbilical   . MYOMECTOMY  2015    Family History  Problem Relation Age of Onset  . Hypertension Mother   . Scleroderma Mother   . Pulmonary fibrosis Mother   . Asthma Mother   . Cancer Neg Hx     Social History   Socioeconomic History  . Marital status: Single    Spouse name: Not on file  . Number of children: 0  . Years of education: Not on file  . Highest education level: Master's degree (e.g., MA, MS, MEng, MEd, MSW, MBA)  Occupational History  . 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 Financial resource strain: Not hard at all  . Food insecurity    Worry: Never true    Inability: Never true  . Transportation needs    Medical: No    Non-medical: No  Tobacco Use  . Smoking status: Never Smoker  . Smokeless tobacco: Never Used  Substance and Sexual Activity  . Alcohol use: Yes    Alcohol/week: 1.0  standard drinks    Types: 1 Standard drinks or equivalent per week    Comment: ocassional  . Drug use: No  . Sexual activity: Yes    Partners: Male    Birth control/protection: I.U.D., Condom  Lifestyle  . Physical activity    Days per week: 2 days    Minutes per session: 60 min  . Stress: Very much  Relationships  . Social connections    Talks on phone: More than three times a week    Gets together: Three times a week    Attends religious service: More than 4 times per year    Active member of club or organization: Yes    Attends meetings of clubs or organizations: More than 4 times per year    Relationship status: Never married  . Intimate partner violence    Fear  of current or ex partner: No    Emotionally abused: No    Physically abused: No    Forced sexual activity: No  Other Topics Concern  . 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     Current Outpatient Medications:  .  Azelastine HCl 137 MCG/SPRAY SOLN, Place 2 sprays into the nose daily., Disp: 90 mL, Rfl: 1 .  DULoxetine (CYMBALTA) 60 MG capsule, Take 1 capsule (60 mg total) by mouth every morning. First week after that 2 daily, Disp: 30 capsule, Rfl: 0 .  fluticasone (FLONASE) 50 MCG/ACT nasal spray, Place 2 sprays into both nostrils daily., Disp: 16 g, Rfl: 1 .  hydrOXYzine (ATARAX/VISTARIL) 10 MG tablet, Take 1-2 tablets (10-20 mg total) by mouth 3 (three) times daily as needed., Disp: 90 tablet, Rfl: 0 .  levocetirizine (XYZAL) 5 MG tablet, Take 1 tablet (5 mg total) by mouth every evening., Disp: 90 tablet, Rfl: 1 .  levonorgestrel (MIRENA) 20 MCG/24HR IUD, 1 Intra Uterine Device (1 each total) by Intrauterine route once., Disp: 1 each, Rfl: 0 .  loratadine (CLARITIN) 10 MG tablet, Take 1 tablet (10 mg total) by mouth daily., Disp: 90 tablet, Rfl: 0 .  montelukast (SINGULAIR) 10 MG tablet, Take 1 tablet (10 mg total) by mouth at bedtime., Disp: 90 tablet, Rfl: 0 .  norethindrone-ethinyl estradiol (LOESTRIN FE) 1-20 MG-MCG tablet, Take 1 tablet by mouth daily., Disp: 1 Package, Rfl: 11  Allergies  Allergen Reactions  . Peanut Oil Swelling    Eyelids swell.  . Peanut-Containing Drug Products     I personally reviewed active problem list, medication list, allergies, lab results with the patient/caregiver today.   ROS Constitutional: Negative for fever or weight change.  Respiratory: Negative for cough and shortness of breath.   Cardiovascular: Negative for chest pain or palpitations.  Gastrointestinal: Negative for abdominal pain, no bowel changes.   Musculoskeletal: Negative for gait problem or joint swelling.  Skin: Negative for rash.  Neurological: Negative for dizziness or headache.  No other specific complaints in a complete review of systems (except as listed in HPI above).  Objective  Virtual encounter, vitals not obtained.  There is no height or weight on file to calculate BMI.  Physical Exam  Constitutional: Patient appears well-developed and well-nourished. No distress.  HENT: Head: Normocephalic and atraumatic.  Neck: Normal range of motion. Pulmonary/Chest: Effort normal. No respiratory distress. Speaking in complete sentences Neurological: Pt is alert and oriented to person,  place, and time. Coordination, speech and gait are normal.  Psychiatric: Patient has a normal mood and affect. behavior is normal. Judgment and thought content normal.  No results found for this or any previous visit (from the past 72 hour(s)).  PHQ2/9: Depression screen [facility] 2/9 11/11/2018 09/23/2018 08/30/2018 06/11/2018 01/26/2018  Decreased Interest 1 1 0 0 0  Down, Depressed, Hopeless 0 1 0 2 0  PHQ - 2 Score 1 2 0 2 0  Altered sleeping 2 3 0 3 3  Tired, decreased energy 2 2 0 1 3  Change in appetite 1 0 0 0 1  Feeling bad or failure about yourself  0 0 0 0 0  Trouble concentrating 0 0 0 0 1  Moving slowly or fidgety/restless 0 0 0 0 0  Suicidal thoughts 0 0 0 0 0  PHQ-9 Score 6 7 0 6 8  Difficult doing work/chores Somewhat difficult Not difficult at all Not difficult at all Somewhat difficult Somewhat difficult   PHQ-2/9 Result is positive.    Fall Risk: Fall Risk  11/11/2018 08/30/2018 06/11/2018 01/26/2018 12/07/2017  Falls in the past year? 0 0 0 No No  Number falls in past yr: 0 0 - - -  Injury with Fall? 0 0 - - -  Follow up Falls evaluation completed Falls evaluation completed - - -   Assessment & Plan  1. Dysthymia - though PHQ-9 score is not improved, she notes a big difference in her mood and motivation.  Encouraged ongoing  counseling and increased social interaction virtually or outdoors if possible - DULoxetine (CYMBALTA) 60 MG capsule; Take 1 capsule (60 mg total) by mouth every morning.  Dispense: 90 capsule; Refill: 1  2. GAD (generalized anxiety disorder) - DULoxetine (CYMBALTA) 60 MG capsule; Take 1 capsule (60 mg total) by mouth every morning.  Dispense: 90 capsule; Refill: 1  I discussed the assessment and treatment plan with the patient. The patient was provided an opportunity to ask questions and all were answered. The patient agreed with the plan and demonstrated an understanding of the instructions.  The patient was advised to call back or seek an in-person evaluation if the symptoms worsen or if the condition fails to improve as anticipated.  I provided 16 minutes of non-face-to-face time during this encounter.

## 2018-11-30 ENCOUNTER — Other Ambulatory Visit (INDEPENDENT_AMBULATORY_CARE_PROVIDER_SITE_OTHER): Payer: 59

## 2018-11-30 ENCOUNTER — Other Ambulatory Visit: Payer: Self-pay | Admitting: Obstetrics and Gynecology

## 2018-11-30 ENCOUNTER — Other Ambulatory Visit: Payer: Self-pay

## 2018-11-30 DIAGNOSIS — N83202 Unspecified ovarian cyst, left side: Secondary | ICD-10-CM | POA: Diagnosis not present

## 2018-12-06 ENCOUNTER — Encounter: Payer: Self-pay | Admitting: Family Medicine

## 2018-12-06 ENCOUNTER — Ambulatory Visit (INDEPENDENT_AMBULATORY_CARE_PROVIDER_SITE_OTHER): Payer: 59 | Admitting: Family Medicine

## 2018-12-06 ENCOUNTER — Other Ambulatory Visit: Payer: Self-pay

## 2018-12-06 DIAGNOSIS — F411 Generalized anxiety disorder: Secondary | ICD-10-CM | POA: Diagnosis not present

## 2018-12-06 DIAGNOSIS — F341 Dysthymic disorder: Secondary | ICD-10-CM | POA: Diagnosis not present

## 2018-12-06 DIAGNOSIS — R768 Other specified abnormal immunological findings in serum: Secondary | ICD-10-CM

## 2018-12-06 DIAGNOSIS — J3089 Other allergic rhinitis: Secondary | ICD-10-CM

## 2018-12-06 DIAGNOSIS — F5104 Psychophysiologic insomnia: Secondary | ICD-10-CM | POA: Diagnosis not present

## 2018-12-06 DIAGNOSIS — M2550 Pain in unspecified joint: Secondary | ICD-10-CM

## 2018-12-06 NOTE — Progress Notes (Signed)
Name: Patricia Horn   MRN: [id number]    DOB: 1991-01-30   Date:12/06/2018       Progress Note  Subjective  Chief Complaint  Chief Complaint  Patient presents with  . Follow-up    I connected with  [name]  on 12/06/18 at  8:20 AM EDT by a video enabled telemedicine application and verified that I am speaking with the correct person using two identifiers.  I discussed the limitations of evaluation and management by telemedicine and the availability of in person appointments. The patient expressed understanding and agreed to proceed. Staff also discussed with the patient that there may be a patient responsible charge related to this service. Patient Location: Home Provider Location: Office Additional Individuals present: None  HPI  Depression/GAD: she has a long history of anxiety and also episodes of dysthymia in the past. She was doing well previous to COVID-19, she uses exercise as a form of dealing with anxiety, however when gym closed she has been working from home and not as active. She was seen by Dr. Ancil Boozer her PCP in May 2020 and started on Cymbalta 60mg  which has worked very well to decrease her anxiety.  Has not needed hydroxyzine since May 2020.  She sees a counselor [name] that has been very helpful - has continued this. At last visit, she realized she has been drinking wine to help her sleep ( difficulty falling and staying asleep) - stopped drinking wine at night since last visit and doing okay without it - still waking (recommend taking hydroxyzine PRN before bed). She has been under a lot of stress. Lost her dog last year, followed by her mother early 2020, bought a house in 2020, turned 38 and has been working from home with very little social interaction because of Kimberly -19. Denies SI/HI.  Allergies: Has clear rhinorrhea, nasal congestion, but no pruritus. No cough or wheezing at this time.  She is using flonase and azelastine along with claritin, xyzal, and  singulair all daily without full symptom relief.  She has seen allergist in the past, was recommended to do allergy shots, but wanted to think about it first.   Overweight: she has been doing well, eating balanced meals with smaller portions; she started doing spin 4 days a week and this is helping her mood.  She notes she is about 160lbs right now.   ANA positive/arthralgia: seen by Rheumatologist and is doing well, no recent episodes of hand pain , no swelling.  She has noticed that when she is stressed she gets more pain and inflammation.  She will get occasional cortisone shots in the right wrist.   Patient Active Problem List   Diagnosis Date Noted  . Dysthymia 11/11/2018  . Amenorrhea, Mirena IUD related 02/23/2018  . Bilateral hand pain 12/28/2017  . Right carpal tunnel syndrome 11/10/2016  . Urticaria 11/09/2015  . Obese 01/05/2015  . Umbilical cyst 30/86/5784  . Perennial allergic rhinitis 11/16/2014  . ANA positive 10/06/2014  . Anxiety 10/06/2014  . History of uterine fibroid 10/06/2014    Past Surgical History:  Procedure Laterality Date  . HERNIA REPAIR  6962   umbilical   . MYOMECTOMY  2015    Family History  Problem Relation Age of Onset  . Hypertension Mother   . Scleroderma Mother   . Pulmonary fibrosis Mother   . Asthma Mother   . Cancer Neg Hx     Social History   Socioeconomic History  .  Marital status: Single    Spouse name: Not on file  . Number of children: 0  . Years of education: Not on file  . Highest education level: Master's degree (e.g., MA, MS, MEng, MEd, MSW, MBA)  Occupational History  . 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 Financial resource strain: Not hard at all  . Food insecurity    Worry: Never true    Inability: Never true  . Transportation needs    Medical: No    Non-medical: No  Tobacco Use  . Smoking status: Never Smoker  . Smokeless tobacco: Never Used   Substance and Sexual Activity  . Alcohol use: Yes    Alcohol/week: 1.0 standard drinks    Types: 1 Standard drinks or equivalent per week    Comment: ocassional  . Drug use: No  . Sexual activity: Yes    Partners: Male    Birth control/protection: I.U.D., Condom  Lifestyle  . Physical activity    Days per week: 2 days    Minutes per session: 60 min  . Stress: Very much  Relationships  . Social connections    Talks on phone: More than three times a week    Gets together: Three times a week    Attends religious service: More than 4 times per year    Active member of club or organization: Yes    Attends meetings of clubs or organizations: More than 4 times per year    Relationship status: Never married  . Intimate partner violence    Fear of current or ex partner: No    Emotionally abused: No    Physically abused: No    Forced sexual activity: No  Other Topics Concern  . 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      Current Outpatient Medications:  .  Azelastine HCl 137 MCG/SPRAY SOLN, Place 2 sprays into the nose daily., Disp: 90 mL, Rfl: 1 .  DULoxetine (CYMBALTA) 60 MG capsule, Take 1 capsule (60 mg total) by mouth every morning., Disp: 90 capsule, Rfl: 1 .  fluticasone (FLONASE) 50 MCG/ACT nasal spray, Place 2 sprays into both nostrils daily., Disp: 16 g, Rfl: 1 .  hydrOXYzine (ATARAX/VISTARIL) 10 MG tablet, Take 1-2 tablets (10-20 mg total) by mouth 3 (three) times daily as needed., Disp: 90 tablet, Rfl: 0 .  levocetirizine (XYZAL) 5 MG tablet, Take 1 tablet (5 mg total) by mouth every evening., Disp: 90 tablet, Rfl: 1 .  levonorgestrel (MIRENA) 20 MCG/24HR IUD, 1 Intra Uterine Device (1 each total) by Intrauterine route once., Disp: 1 each, Rfl: 0 .  loratadine (CLARITIN) 10 MG tablet, Take 1 tablet (10 mg total) by mouth daily., Disp: 90  tablet, Rfl: 0 .  montelukast (SINGULAIR) 10 MG tablet, Take 1 tablet (10 mg total) by mouth at bedtime., Disp: 90 tablet, Rfl: 0 .  norethindrone-ethinyl estradiol (LOESTRIN FE) 1-20 MG-MCG tablet, Take 1 tablet by mouth daily., Disp: 1 Package, Rfl: 11  Allergies  Allergen Reactions  . Peanut Oil Swelling    Eyelids swell.  . Peanut-Containing Drug Products     I personally reviewed active problem list, medication list, allergies, health maintenance, notes from last encounter, lab results with the patient/caregiver today.   ROS  Constitutional:  Negative for fever or weight change.  Respiratory: Negative for cough and shortness of breath.   Cardiovascular: Negative for chest pain or palpitations.  Gastrointestinal: Negative for abdominal pain, no bowel changes.  Musculoskeletal: Negative for gait problem or joint swelling.  Skin: Negative for rash.  Neurological: Negative for dizziness or headache.  No other specific complaints in a complete review of systems (except as listed in HPI above).  Objective  Virtual encounter, vitals not obtained.  There is no height or weight on file to calculate BMI.  Physical Exam  Constitutional: Patient appears well-developed and well-nourished. No distress.  HENT: Head: Normocephalic and atraumatic.  Neck: Normal range of motion. Pulmonary/Chest: Effort normal. No respiratory distress. Speaking in complete sentences Neurological: Pt is alert and oriented to person, place, and time. Coordination, speech are normal.  Psychiatric: Patient has a normal mood and affect. behavior is normal. Judgment and thought content normal.  No results found for this or any previous visit (from the past 72 hour(s)).  PHQ2/9: Depression screen [facility] 2/9 12/06/2018 11/11/2018 09/23/2018 08/30/2018 06/11/2018  Decreased Interest 1 1 1  0 0  Down, Depressed, Hopeless 1 0 1 0 2  PHQ - 2 Score 2 1 2  0 2  Altered sleeping 1 2 3  0 3  Tired, decreased energy 1 2 2  0 1   Change in appetite 1 1 0 0 0  Feeling bad or failure about yourself  1 0 0 0 0  Trouble concentrating 1 0 0 0 0  Moving slowly or fidgety/restless 1 0 0 0 0  Suicidal thoughts 0 0 0 0 0  PHQ-9 Score 8 6 7  0 6  Difficult doing work/chores Somewhat difficult Somewhat difficult Not difficult at all Not difficult at all Somewhat difficult   PHQ-2/9 Result is positive.    Fall Risk: Fall Risk  12/06/2018 11/11/2018 08/30/2018 06/11/2018 01/26/2018  Falls in the past year? 0 0 0 0 No  Number falls in past yr: 0 0 0 - -  Injury with Fall? 0 0 0 - -  Follow up Falls evaluation completed Falls evaluation completed Falls evaluation completed - -    Assessment & Plan  1. Dysthymia She feels the Cymbalta is working and that most of her symptoms are based on her insomnia.    2. GAD (generalized anxiety disorder) - Anxiety has been well controlled except for nighttime wakings.  3. Psychophysiological insomnia - Encouraged her to trial hydroxyzine before bedtime.  Practice meditation during the day, then utilize these tools at night when not able to fall back asleep.  May want to talk to her counselor about going through a few exercises with him to practice.  Listen to soothing music, white noise machine, etc.  4. Perennial allergic rhinitis - Continue current medications; will refer to allergist if she would like.  5. ANA positive - Stable; no longer following with rheumatology as her pain has been well controlled  6. Arthralgia, unspecified joint - Stable; no longer following with rheumatology as her pain has been well controlled   I discussed the assessment and treatment plan with the patient. The patient was provided an opportunity to ask questions and all were answered. The patient agreed with the plan and demonstrated an understanding of the instructions.  The patient was advised to call back or seek an in-person evaluation if the symptoms worsen or if the condition fails to improve as  anticipated.  I provided 16 minutes of non-face-to-face time during this encounter.

## 2018-12-10 ENCOUNTER — Other Ambulatory Visit: Payer: Self-pay | Admitting: Family Medicine

## 2018-12-10 DIAGNOSIS — J3089 Other allergic rhinitis: Secondary | ICD-10-CM

## 2018-12-12 ENCOUNTER — Encounter: Payer: Self-pay | Admitting: Family Medicine

## 2019-02-25 ENCOUNTER — Encounter: Payer: 59 | Admitting: Obstetrics and Gynecology

## 2019-03-02 ENCOUNTER — Other Ambulatory Visit: Payer: Self-pay

## 2019-03-02 ENCOUNTER — Other Ambulatory Visit (HOSPITAL_COMMUNITY)
Admission: RE | Admit: 2019-03-02 | Discharge: 2019-03-02 | Disposition: A | Discharge status: Home / Self Care | Payer: 59 | Source: Ambulatory Visit | Attending: Obstetrics and Gynecology | Admitting: Obstetrics and Gynecology

## 2019-03-02 ENCOUNTER — Ambulatory Visit (INDEPENDENT_AMBULATORY_CARE_PROVIDER_SITE_OTHER): Payer: 59 | Admitting: Obstetrics and Gynecology

## 2019-03-02 ENCOUNTER — Encounter: Payer: Self-pay | Admitting: Obstetrics and Gynecology

## 2019-03-02 VITALS — BP 111/72 | HR 76 | Ht 62.0 in | Wt 163.2 lb

## 2019-03-02 DIAGNOSIS — Z30432 Encounter for removal of intrauterine contraceptive device: Secondary | ICD-10-CM

## 2019-03-02 DIAGNOSIS — Z23 Encounter for immunization: Secondary | ICD-10-CM

## 2019-03-02 DIAGNOSIS — D259 Leiomyoma of uterus, unspecified: Secondary | ICD-10-CM

## 2019-03-02 DIAGNOSIS — E663 Overweight: Secondary | ICD-10-CM | POA: Insufficient documentation

## 2019-03-02 DIAGNOSIS — Z113 Encounter for screening for infections with a predominantly sexual mode of transmission: Secondary | ICD-10-CM | POA: Diagnosis not present

## 2019-03-02 DIAGNOSIS — Z01419 Encounter for gynecological examination (general) (routine) without abnormal findings: Secondary | ICD-10-CM

## 2019-03-02 HISTORY — DX: Leiomyoma of uterus, unspecified: D25.9

## 2019-03-02 MED ORDER — NORETHIN ACE-ETH ESTRAD-FE 1-20 MG-MCG PO TABS: 1.0000 | ORAL_TABLET | Freq: Every day | ORAL | 11 refills | Status: DC

## 2019-03-02 NOTE — Patient Instructions (Addendum)
Preventive Care 21-30 Years Old, Female Preventive care refers to visits with your health care provider and lifestyle choices that can promote health and wellness. This includes:  A yearly physical exam. This may also be called an annual well check.  Regular dental visits and eye exams.  Immunizations.  Screening for certain conditions.  Healthy lifestyle choices, such as eating a healthy diet, getting regular exercise, not using drugs or products that contain nicotine and tobacco, and limiting alcohol use. What can I expect for my preventive care visit? Physical exam Your health care provider will check your:  Height and weight. This may be used to calculate body mass index (BMI), which tells if you are at a healthy weight.  Heart rate and blood pressure.  Skin for abnormal spots. Counseling Your health care provider may ask you questions about your:  Alcohol, tobacco, and drug use.  Emotional well-being.  Home and relationship well-being.  Sexual activity.  Eating habits.  Work and work environment.  Method of birth control.  Menstrual cycle.  Pregnancy history. What immunizations do I need?  Influenza (flu) vaccine  This is recommended every year. Tetanus, diphtheria, and pertussis (Tdap) vaccine  You may need a Td booster every 10 years. Varicella (chickenpox) vaccine  You may need this if you have not been vaccinated. Human papillomavirus (HPV) vaccine  If recommended by your health care provider, you may need three doses over 6 months. Measles, mumps, and rubella (MMR) vaccine  You may need at least one dose of MMR. You may also need a second dose. Meningococcal conjugate (MenACWY) vaccine  One dose is recommended if you are age 19-21 years and a first-year college student living in a residence hall, or if you have one of several medical conditions. You may also need additional booster doses. Pneumococcal conjugate (PCV13) vaccine  You may need  this if you have certain conditions and were not previously vaccinated. Pneumococcal polysaccharide (PPSV23) vaccine  You may need one or two doses if you smoke cigarettes or if you have certain conditions. Hepatitis A vaccine  You may need this if you have certain conditions or if you travel or work in places where you may be exposed to hepatitis A. Hepatitis B vaccine  You may need this if you have certain conditions or if you travel or work in places where you may be exposed to hepatitis B. Haemophilus influenzae type b (Hib) vaccine  You may need this if you have certain conditions. You may receive vaccines as individual doses or as more than one vaccine together in one shot (combination vaccines). Talk with your health care provider about the risks and benefits of combination vaccines. What tests do I need?  Blood tests  Lipid and cholesterol levels. These may be checked every 5 years starting at age 20.  Hepatitis C test.  Hepatitis B test. Screening  Diabetes screening. This is done by checking your blood sugar (glucose) after you have not eaten for a while (fasting).  Sexually transmitted disease (STD) testing.  BRCA-related cancer screening. This may be done if you have a family history of breast, ovarian, tubal, or peritoneal cancers.  Pelvic exam and Pap test. This may be done every 3 years starting at age 21. Starting at age 30, this may be done every 5 years if you have a Pap test in combination with an HPV test. Talk with your health care provider about your test results, treatment options, and if necessary, the need for more tests.   Follow these instructions at home: °Eating and drinking ° °· Eat a diet that includes fresh fruits and vegetables, whole grains, lean protein, and low-fat dairy. °· Take vitamin and mineral supplements as recommended by your health care provider. °· Do not drink alcohol if: °? Your health care provider tells you not to drink. °? You are  pregnant, may be pregnant, or are planning to become pregnant. °· If you drink alcohol: °? Limit how much you have to 0-1 drink a day. °? Be aware of how much alcohol is in your drink. In the [place] one drink equals one 12 oz bottle of beer (355 mL), one 5 oz glass of wine (148 mL), or one 1½ oz glass of hard liquor (44 mL). °Lifestyle °· Take daily care of your teeth and gums. °· Stay active. Exercise for at least 30 minutes on 5 or more days each week. °· Do not use any products that contain nicotine or tobacco, such as cigarettes, e-cigarettes, and chewing tobacco. If you need help quitting, ask your health care provider. °· If you are sexually active, practice safe sex. Use a condom or other form of birth control (contraception) in order to prevent pregnancy and STIs (sexually transmitted infections). If you plan to become pregnant, see your health care provider for a preconception visit. °What's next? °· Visit your health care provider once a year for a well check visit. °· Ask your health care provider how often you should have your eyes and teeth checked. °· Stay up to date on all vaccines. °This information is not intended to replace advice given to you by your health care provider. Make sure you discuss any questions you have with your health care provider. °Document Released: 06/17/2001 Document Revised: 12/31/2017 Document Reviewed: 12/31/2017 °Elsevier Patient Education © 2020 Elsevier Inc. °Breast Self-Awareness °Breast self-awareness is knowing how your breasts look and feel. Doing breast self-awareness is important. It allows you to catch a breast problem early while it is still small and can be treated. All women should do breast self-awareness, including women who have had breast implants. Tell your doctor if you notice a change in your breasts. °What you need: °· A mirror. °· A well-lit room. °How to do a breast self-exam °A breast self-exam is one way to learn what is normal for your breasts and to  check for changes. To do a breast self-exam: °Look for changes ° °1. Take off all the clothes above your waist. °2. Stand in front of a mirror in a room with good lighting. °3. Put your hands on your hips. °4. Push your hands down. °5. Look at your breasts and nipples in the mirror to see if one breast or nipple looks different from the other. Check to see if: °? The shape of one breast is different. °? The size of one breast is different. °? There are wrinkles, dips, and bumps in one breast and not the other. °6. Look at each breast for changes in the skin, such as: °? Redness. °? Scaly areas. °7. Look for changes in your nipples, such as: °? Liquid around the nipples. °? Bleeding. °? Dimpling. °? Redness. °? A change in where the nipples are. °Feel for changes ° °1. Lie on your back on the floor. °2. Feel each breast. To do this, follow these steps: °? Pick a breast to feel. °? Put the arm closest to that breast above your head. °? Use your other arm to feel the nipple area of   your breast. Feel the area with the pads of your three middle fingers by making small circles with your fingers. For the first circle, press lightly. For the second circle, press harder. For the third circle, press even harder. ? Keep making circles with your fingers at the different pressures as you move down your breast. Stop when you feel your ribs. ? Move your fingers a little toward the center of your body. ? Start making circles with your fingers again, this time going up until you reach your collarbone. ? Keep making up-and-down circles until you reach your armpit. Remember to keep using the three pressures. ? Feel the other breast in the same way. 3. Sit or stand in the tub or shower. 4. With soapy water on your skin, feel each breast the same way you did in step 2 when you were lying on the floor. Write down what you find Writing down what you find can help you remember what to tell your doctor. Write down:  What is  normal for each breast.  Any changes you find in each breast, including: ? The kind of changes you find. ? Whether you have pain. ? Size and location of any lumps.  When you last had your menstrual period. General tips  Check your breasts every month.  If you are breastfeeding, the best time to check your breasts is after you feed your baby or after you use a breast pump.  If you get menstrual periods, the best time to check your breasts is 5-7 days after your menstrual period is over.  With time, you will become comfortable with the self-exam, and you will begin to know if there are changes in your breasts. Contact a doctor if you:  See a change in the shape or size of your breasts or nipples.  See a change in the skin of your breast or nipples, such as red or scaly skin.  Have fluid coming from your nipples that is not normal.  Find a lump or thick area that was not there before.  Have pain in your breasts.  Have any concerns about your breast health. Summary  Breast self-awareness includes looking for changes in your breasts, as well as feeling for changes within your breasts.  Breast self-awareness should be done in front of a mirror in a well-lit room.  You should check your breasts every month. If you get menstrual periods, the best time to check your breasts is 5-7 days after your menstrual period is over.  Let your doctor know of any changes you see in your breasts, including changes in size, changes on the skin, pain or tenderness, or fluid from your nipples that is not normal. This information is not intended to replace advice given to you by your health care provider. Make sure you discuss any questions you have with your health care provider. Document Released: 10/08/2007 Document Revised: 12/08/2017 Document Reviewed: 12/08/2017 Elsevier Patient Education  [place]. Influenza (Flu) Vaccine (Inactivated or Recombinant): What You Need to Know 1. Why get  vaccinated? Influenza vaccine can prevent influenza (flu). Flu is a contagious disease that spreads around the [place] every year, usually between October and May. Anyone can get the flu, but it is more dangerous for some people. Infants and young children, people 77 years of age and older, pregnant women, and people with certain health conditions or a weakened immune system are at greatest risk of flu complications. Pneumonia, bronchitis, sinus infections and ear  infections are examples of flu-related complications. If you have a medical condition, such as heart disease, cancer or diabetes, flu can make it worse. Flu can cause fever and chills, sore throat, muscle aches, fatigue, cough, headache, and runny or stuffy nose. Some people may have vomiting and diarrhea, though this is more common in children than adults. Each year thousands of people in the [place] die from flu, and many more are hospitalized. Flu vaccine prevents millions of illnesses and flu-related visits to the doctor each year. 2. Influenza vaccine CDC recommends everyone 6 months of age and older get vaccinated every flu season. Children 6 months through 51 years of age may need 2 doses during a single flu season. Everyone else needs only 1 dose each flu season. It takes about 2 weeks for protection to develop after vaccination. There are many flu viruses, and they are always changing. Each year a new flu vaccine is made to protect against three or four viruses that are likely to cause disease in the upcoming flu season. Even when the vaccine doesn't exactly match these viruses, it may still provide some protection. Influenza vaccine does not cause flu. Influenza vaccine may be given at the same time as other vaccines. 3. Talk with your health care provider Tell your vaccine provider if the person getting the vaccine: Has had an allergic reaction after a previous dose of influenza vaccine, or has any severe,  life-threatening allergies. Has ever had Guillain-Barr Syndrome (also called GBS). In some cases, your health care provider may decide to postpone influenza vaccination to a future visit. People with minor illnesses, such as a cold, may be vaccinated. People who are moderately or severely ill should usually wait until they recover before getting influenza vaccine. Your health care provider can give you more information. 4. Risks of a vaccine reaction Soreness, redness, and swelling where shot is given, fever, muscle aches, and headache can happen after influenza vaccine. There may be a very small increased risk of Guillain-Barr Syndrome (GBS) after inactivated influenza vaccine (the flu shot). Young children who get the flu shot along with pneumococcal vaccine (PCV13), and/or DTaP vaccine at the same time might be slightly more likely to have a seizure caused by fever. Tell your health care provider if a child who is getting flu vaccine has ever had a seizure. People sometimes faint after medical procedures, including vaccination. Tell your provider if you feel dizzy or have vision changes or ringing in the ears. As with any medicine, there is a very remote chance of a vaccine causing a severe allergic reaction, other serious injury, or death. 5. What if there is a serious problem? An allergic reaction could occur after the vaccinated person leaves the clinic. If you see signs of a severe allergic reaction (hives, swelling of the face and throat, difficulty breathing, a fast heartbeat, dizziness, or weakness), call 9-1-1 and get the person to the nearest hospital. For other signs that concern you, call your health care provider. Adverse reactions should be reported to the Vaccine Adverse Event Reporting System (VAERS). Your health care provider will usually file this report, or you can do it yourself. Visit the VAERS website at www.vaers.SamedayNews.es or call 234-509-3713.VAERS is only for reporting  reactions, and VAERS staff do not give medical advice. 6. The National Vaccine Injury Compensation Program The [employer] Vaccine Injury Compensation Program (VICP) is a federal program that was created to compensate people who may have been injured by certain vaccines. Visit the VICP  website at GoldCloset.com.ee or call 760-839-1687 to learn about the program and about filing a claim. There is a time limit to file a claim for compensation. 7. How can I learn more? Ask your healthcare provider. Call your local or state health department. Contact the Centers for Disease Control and Prevention (CDC): Call 613-592-5797 (1-800-CDC-INFO) or Visit CDC's https://gibson.com/ Vaccine Information Statement (Interim) Inactivated Influenza Vaccine (12/17/2017) This information is not intended to replace advice given to you by your health care provider. Make sure you discuss any questions you have with your health care provider. Document Released: 02/13/2006 Document Revised: 08/10/2018 Document Reviewed: 12/21/2017 Elsevier Patient Education  2020 [employer].

## 2019-03-02 NOTE — Progress Notes (Signed)
Pt present for annual exam. Pt's last pap smear 02/23/18 results normal. LMP unsure due to IUD.  Pt has Mirena IUD and states she noticed brown blood the beginning of the month and concerned if she needs to take a break from the IUD and have it removed today. Pt stated that she was doing well and denies any issues at this time. Pt had flu vaccine completed today.

## 2019-03-02 NOTE — Progress Notes (Signed)
ANNUAL PREVENTATIVE CARE GYN  ENCOUNTER NOTE  Subjective:       Patricia Horn is a 28 y.o. G0P0000 female here for a routine annual gynecologic exam. Patient is currently sexually active.  She reports that she engages in exercise.   Current complaints:  1. Notes an episode of bleeding this past month with IUD. Has otherwise been fine. Has had IUD in place for ~ 3 years. Desires removal of IUD today (notes she has been on birth control since age 32, and desires to see what her cycles are like without birth control).    Gynecologic History No LMP recorded. (Menstrual status: IUD). Contraception: Mirena IUD   Last Pap: 02/03/2018. Results were: normal     Obstetric History OB History  Gravida Para Term Preterm AB Living  0 0 0 0 0 0  SAB TAB Ectopic Multiple Live Births  0 0 0 0    Obstetric Comments  1st Menstrual Cycle:  8    Past Medical History:  Diagnosis Date  . Anxiety   . Depression   . Environmental allergies   . Tendinitis   . Umbilical hernia     Past Surgical History:  Procedure Laterality Date  . HERNIA REPAIR  [id number]   umbilical   . MYOMECTOMY  2015    Current Outpatient Medications on File Prior to Visit  Medication Sig Dispense Refill  . Azelastine HCl 137 MCG/SPRAY SOLN Place 2 sprays into the nose daily. 90 mL 1  . DULoxetine (CYMBALTA) 60 MG capsule Take 1 capsule (60 mg total) by mouth every morning. 90 capsule 1  . fluticasone (FLONASE) 50 MCG/ACT nasal spray Place 2 sprays into both nostrils daily. 16 g 1  . hydrOXYzine (ATARAX/VISTARIL) 10 MG tablet Take 1-2 tablets (10-20 mg total) by mouth 3 (three) times daily as needed. 90 tablet 0  . levocetirizine (XYZAL) 5 MG tablet Take 1 tablet (5 mg total) by mouth every evening. 90 tablet 1  . levonorgestrel (MIRENA) 20 MCG/24HR IUD 1 Intra Uterine Device (1 each total) by Intrauterine route once. 1 each 0  . loratadine (CLARITIN) 10 MG tablet Take 1 tablet (10 mg total) by mouth daily. 90 tablet 0  .  montelukast (SINGULAIR) 10 MG tablet TAKE 1 TABLET BY MOUTH AT BEDTIME 90 tablet 0   No current facility-administered medications on file prior to visit.     Allergies  Allergen Reactions  . Peanut Oil Swelling    Eyelids swell.  . Peanut-Containing Drug Products     Social History   Socioeconomic History  . Marital status: Single    Spouse name: Not on file  . Number of children: 0  . Years of education: Not on file  . Highest education level: Master's degree (e.g., MA, MS, MEng, MEd, MSW, MBA)  Occupational History  . 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 Financial resource strain: Not hard at all  . Food insecurity    Worry: Never true    Inability: Never true  . Transportation needs    Medical: No    Non-medical: No  Tobacco Use  . Smoking status: Never Smoker  . Smokeless tobacco: Never Used  Substance and Sexual Activity  . Alcohol use: Yes    Alcohol/week: 1.0 standard drinks    Types: 1 Standard drinks or equivalent per week    Comment: ocassional  . Drug use: No  . Sexual activity:  Yes    Partners: Male    Birth control/protection: I.U.D., Condom  Lifestyle  . Physical activity    Days per week: 2 days    Minutes per session: 60 min  . Stress: Very much  Relationships  . Social connections    Talks on phone: More than three times a week    Gets together: Three times a week    Attends religious service: More than 4 times per year    Active member of club or organization: Yes    Attends meetings of clubs or organizations: More than 4 times per year    Relationship status: Never married  . Intimate partner violence    Fear of current or ex partner: No    Emotionally abused: No    Physically abused: No    Forced sexual activity: No  Other Topics Concern  . 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     Family History  Problem Relation Age of Onset  . Hypertension Mother   . Scleroderma Mother   . Pulmonary fibrosis Mother   . Asthma Mother   . Cancer Neg Hx      Review of Systems Constitutional: negative for chills, fatigue, fevers and sweats Eyes: negative for irritation, redness and visual disturbance Ears, nose, mouth, throat, and face: negative for hearing loss, nasal congestion, snoring and tinnitus Respiratory: negative for asthma, cough, sputum Cardiovascular: negative for chest pain, dyspnea, exertional chest pressure/discomfort, irregular heart beat, palpitations and syncope Gastrointestinal: negative for abdominal pain, change in bowel habits, nausea and vomiting Genitourinary: negative for abnormal menstrual periods, genital lesions, sexual problems and vaginal discharge, dysuria and urinary incontinence Integument/breast: negative for breast lump, breast tenderness and nipple discharge Hematologic/lymphatic: negative for bleeding and easy bruising Musculoskeletal:negative for back pain and muscle weakness Neurological: negative for dizziness, headaches, vertigo and weakness Endocrine: negative for diabetic symptoms including polydipsia, polyuria and skin dryness Allergic/Immunologic: negative for hay fever and urticaria       Objective:   BP 111/72   Pulse 76   Ht 5\' 2"  (1.575 m)   Wt 163 lb 3.2 oz (74 kg)   BMI 29.85 kg/m  CONSTITUTIONAL: Well-developed, well-nourished female in no acute distress.  PSYCHIATRIC: Normal mood and affect. Normal behavior. Normal judgment and thought content. [place]: Alert and oriented to person, place, and time. Normal muscle tone coordination. No cranial nerve deficit noted. HENT:  Normocephalic, atraumatic, External right and left ear normal. EYES: Conjunctivae and EOM are normal. . No scleral icterus.  NECK: Normal range of motion, supple, no masses.  Normal thyroid.  SKIN: Skin  is warm and dry. No rash noted. Not diaphoretic. No erythema. No pallor. CARDIOVASCULAR: Normal heart rate noted, regular rhythm, no murmur. RESPIRATORY: Clear to auscultation bilaterally. Effort and breath sounds normal, no problems with respiration noted. BREASTS: Symmetric in size. No masses, skin changes, nipple drainage, or lymphadenopathy. ABDOMEN: Soft, normal bowel sounds, no distention noted.  No tenderness, rebound or guarding.  BLADDER: Normal PELVIC:  External Genitalia: Normal without rash or ulcer  BUS: Normal  Vagina: Normal; minimal white secretions in vaginal vault  Cervix: Normal; IUD strings shortened-0.5 cm (visualized with cytobrush); no cervical motion tenderness  Uterus: Normal; midplane, normal size and shape, mobile, nontender  Adnexa: Normal; nonpalpable and nontender  RV: External Exam NormaI  MUSCULOSKELETAL:  Normal range of motion. No tenderness.  No cyanosis, clubbing, or edema.  2+ distal pulses. LYMPHATIC: No Axillary, Supraclavicular, or Inguinal Adenopathy.    Assessment:   Annual gynecologic examination 28 y.o. Contraception: IUD Mirena Overweight History of fibroids Flu vaccine need  Plan:  Pap: pap smear up to date.  Labs: CBC, CMP, Lipid panel, TSH ordered.  Routine preventative health maintenance measures emphasized: Exercise/Diet/Weight control, Tobacco Warnings, Alcohol/Substance use risks and Safe Sex Contraception: IUD, desires removal today. See procedure note below. Patient notes she may try OCPs if her cycle becomes heavy again. Will prescribe Junel 20 mcg.  Desires STD screen, no concerns, just desires routine screening.  History of fibroids, prior myomectomy.  Currently asymptomatic.  Flu vaccine given.  Return to [place] OFFICE PROCEDURE NOTE  Patricia Horn is a 28 y.o. G0P0000 here for [place] removal. No GYN concerns.  Last pap smear was on 02/2018 and was normal.  IUD Removal  Patient  identified, informed consent performed, consent signed.  Patient was in the dorsal lithotomy position, normal external genitalia was noted.  A speculum was placed in the patient's vagina, normal discharge was noted, no lesions. The cervix was visualized, no lesions, no abnormal discharge.  The strings of the IUD were grasped and pulled using ring forceps. The IUD was removed in its entirety.   Patient tolerated the procedure well.    Patient will use combined OCPs for contraception.  Routine preventative health maintenance measures emphasized.     Rubie Maid, MD  [facility]

## 2019-03-03 LAB — CBC
Hematocrit: 36.3 % (ref 34.0–46.6)
Hemoglobin: 12.3 g/dL (ref 11.1–15.9)
MCH: 32 pg (ref 26.6–33.0)
MCHC: 33.9 g/dL (ref 31.5–35.7)
MCV: 95 fL (ref 79–97)
Platelets: 312 10*3/uL (ref 150–450)
RBC: 3.84 x10E6/uL (ref 3.77–5.28)
RDW: 12.6 % (ref 11.7–15.4)
WBC: 7.2 10*3/uL (ref 3.4–10.8)

## 2019-03-03 LAB — COMPREHENSIVE METABOLIC PANEL
ALT: 9 IU/L (ref 0–32)
AST: 14 IU/L (ref 0–40)
Albumin/Globulin Ratio: 1.8 (ref 1.2–2.2)
Albumin: 4.6 g/dL (ref 3.9–5.0)
Alkaline Phosphatase: 76 IU/L (ref 39–117)
BUN/Creatinine Ratio: 21 (ref 9–23)
BUN: 16 mg/dL (ref 6–20)
Bilirubin Total: 0.2 mg/dL (ref 0.0–1.2)
CO2: 24 mmol/L (ref 20–29)
Calcium: 9.6 mg/dL (ref 8.7–10.2)
Chloride: 105 mmol/L (ref 96–106)
Creatinine, Ser: 0.76 mg/dL (ref 0.57–1.00)
GFR calc Af Amer: 122 mL/min/{1.73_m2} (ref >59)
GFR calc non Af Amer: 106 mL/min/{1.73_m2} (ref >59)
Globulin, Total: 2.6 g/dL (ref 1.5–4.5)
Glucose: 93 mg/dL (ref 65–99)
Potassium: 4.3 mmol/L (ref 3.5–5.2)
Sodium: 141 mmol/L (ref 134–144)
Total Protein: 7.2 g/dL (ref 6.0–8.5)

## 2019-03-03 LAB — LIPID PANEL
Chol/HDL Ratio: 2.4 ratio (ref 0.0–4.4)
Cholesterol, Total: 146 mg/dL (ref 100–199)
HDL: 60 mg/dL (ref >39)
LDL Chol Calc (NIH): 76 mg/dL (ref 0–99)
Triglycerides: 47 mg/dL (ref 0–149)
VLDL Cholesterol Cal: 10 mg/dL (ref 5–40)

## 2019-03-03 LAB — RPR: RPR Ser Ql: NONREACTIVE

## 2019-03-03 LAB — HIV ANTIBODY (ROUTINE TESTING W REFLEX): HIV Screen 4th Generation wRfx: NONREACTIVE

## 2019-03-03 LAB — TSH: TSH: 2.69 u[IU]/mL (ref 0.450–4.500)

## 2019-03-04 ENCOUNTER — Other Ambulatory Visit: Payer: Self-pay | Admitting: Family Medicine

## 2019-03-04 DIAGNOSIS — J3089 Other allergic rhinitis: Secondary | ICD-10-CM

## 2019-03-04 LAB — CERVICOVAGINAL ANCILLARY ONLY
Chlamydia: NEGATIVE
Comment: NEGATIVE
Comment: NEGATIVE
Comment: NORMAL
Neisseria Gonorrhea: NEGATIVE
Trichomonas: NEGATIVE

## 2019-03-05 ENCOUNTER — Other Ambulatory Visit: Payer: Self-pay | Admitting: Family Medicine

## 2019-03-05 DIAGNOSIS — J3089 Other allergic rhinitis: Secondary | ICD-10-CM

## 2019-03-14 ENCOUNTER — Encounter: Payer: Self-pay | Admitting: Family Medicine

## 2019-03-14 ENCOUNTER — Other Ambulatory Visit: Payer: Self-pay

## 2019-03-14 ENCOUNTER — Ambulatory Visit: Payer: 59 | Admitting: Family Medicine

## 2019-03-14 VITALS — BP 116/74 | HR 93 | Temp 97.3°F | Resp 16 | Ht 63.0 in | Wt 163.7 lb

## 2019-03-14 DIAGNOSIS — J3089 Other allergic rhinitis: Secondary | ICD-10-CM

## 2019-03-14 DIAGNOSIS — F341 Dysthymic disorder: Secondary | ICD-10-CM

## 2019-03-14 DIAGNOSIS — F411 Generalized anxiety disorder: Secondary | ICD-10-CM | POA: Diagnosis not present

## 2019-03-14 DIAGNOSIS — F5104 Psychophysiologic insomnia: Secondary | ICD-10-CM

## 2019-03-14 MED ORDER — MONTELUKAST SODIUM 10 MG PO TABS: 10.0000 mg | ORAL_TABLET | Freq: Every day | ORAL | 0 refills | Status: DC

## 2019-03-14 MED ORDER — AZELASTINE HCL 137 MCG/SPRAY NA SOLN: 2.0000 | Freq: Every day | NASAL | 1 refills | Status: DC

## 2019-03-14 MED ORDER — LORATADINE 10 MG PO TABS: 10.0000 mg | ORAL_TABLET | Freq: Every day | ORAL | 0 refills | Status: DC

## 2019-03-14 MED ORDER — LEVOCETIRIZINE DIHYDROCHLORIDE 5 MG PO TABS: 5.0000 mg | ORAL_TABLET | Freq: Every evening | ORAL | 0 refills | Status: DC

## 2019-03-14 MED ORDER — DULOXETINE HCL 60 MG PO CPEP: 60.0000 mg | ORAL_CAPSULE | ORAL | 1 refills | Status: DC

## 2019-03-14 NOTE — Progress Notes (Signed)
Name: Patricia Horn   MRN: [id number]    DOB: [birth year]   Date:03/14/2019       Progress Note  Subjective  Chief Complaint  Chief Complaint  Patient presents with  . Medication Refill  . Allergic Rhinitis   . Depression    HPI  Depression/GAD: she has a long history of anxiety and also episodes of dysthymia in the past. She felt much worse after dog died in 07/12/2017, mother in 05-14-18, followed by COVID-19  currently having problems with politics and her church that is lead by a relative. She is taking Duloxetine and seeing [name] therapist, it has been helpful. She had some suicidal thoughts in the past but not currently.   Allergies: Has clear rhinorrhea, nasal congestion, but no pruritus. No cough or wheezing at this time. She is using flonase and azelastine along with claritin, xyzal, and singulair all daily. Symptoms are much worse during Spring but present year round.   Overweight: she has a Peloton bike  eating balanced meals with smaller portions; weight is stable in the 160's  Lbs range   ANA positive/arthralgia: seen by Rheumatologist and is doing well, no recent episodes of hand pain , no swelling.  Knees gets sore at times because of bike, but otherwise doing well   Patient Active Problem List   Diagnosis Date Noted  . Uterine leiomyoma 03/02/2019  . Overweight (BMI 25.0-29.9) 03/02/2019  . Dysthymia 11/11/2018  . Bilateral hand pain 12/28/2017  . Right carpal tunnel syndrome 11/10/2016  . Urticaria 11/09/2015  . Umbilical cyst 93/90/3009  . Perennial allergic rhinitis 11/16/2014  . ANA positive 10/06/2014  . Anxiety 10/06/2014    Past Surgical History:  Procedure Laterality Date  . HERNIA REPAIR  2330   umbilical   . MYOMECTOMY  07/12/13    Family History  Problem Relation Age of Onset  . Hypertension Mother   . Scleroderma Mother   . Pulmonary fibrosis Mother   . Asthma Mother   . Cancer Neg Hx     Social History   Socioeconomic History  .  Marital status: Single    Spouse name: Not on file  . Number of children: 0  . Years of education: Not on file  . Highest education level: Master's degree (e.g., MA, MS, MEng, MEd, MSW, MBA)  Occupational History  . 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 Financial resource strain: Not hard at all  . Food insecurity    Worry: Never true    Inability: Never true  . Transportation needs    Medical: No    Non-medical: No  Tobacco Use  . Smoking status: Never Smoker  . Smokeless tobacco: Never Used  Substance and Sexual Activity  . Alcohol use: Yes    Alcohol/week: 1.0 standard drinks    Types: 1 Standard drinks or equivalent per week    Comment: ocassional  . Drug use: No  . Sexual activity: Yes    Partners: Male    Birth control/protection: I.U.D., Condom  Lifestyle  . Physical activity    Days per week: 4 days    Minutes per session: 60 min  . Stress: Very much  Relationships  . Social connections    Talks on phone: More than three times a week    Gets together: Three times a week    Attends religious service: More than 4 times per year  Active member of club or organization: Yes    Attends meetings of clubs or organizations: More than 4 times per year    Relationship status: Never married  . Intimate partner violence    Fear of current or ex partner: No    Emotionally abused: No    Physically abused: No    Forced sexual activity: No  Other Topics Concern  . 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      Current Outpatient Medications:  .  Azelastine HCl 137 MCG/SPRAY SOLN, Place 2 sprays into the nose daily., Disp: 90 mL, Rfl: 1 .  DULoxetine (CYMBALTA) 60 MG capsule, Take 1 capsule (60 mg total) by mouth every morning., Disp: 90 capsule, Rfl: 1 .  fluticasone (FLONASE) 50  MCG/ACT nasal spray, Place 2 sprays into both nostrils daily., Disp: 16 g, Rfl: 1 .  hydrOXYzine (ATARAX/VISTARIL) 10 MG tablet, Take 1-2 tablets (10-20 mg total) by mouth 3 (three) times daily as needed., Disp: 90 tablet, Rfl: 0 .  levocetirizine (XYZAL) 5 MG tablet, TAKE 1 TABLET BY MOUTH ONCE DAILY IN THE EVENING, Disp: 90 tablet, Rfl: 0 .  loratadine (CLARITIN) 10 MG tablet, Take 1 tablet (10 mg total) by mouth daily., Disp: 90 tablet, Rfl: 0 .  montelukast (SINGULAIR) 10 MG tablet, TAKE 1 TABLET BY MOUTH AT BEDTIME, Disp: 90 tablet, Rfl: 0 .  norethindrone-ethinyl estradiol (JUNEL FE 1/20) 1-20 MG-MCG tablet, Take 1 tablet by mouth daily., Disp: 1 Package, Rfl: 11  Allergies  Allergen Reactions  . Peanut Oil Swelling    Eyelids swell.  . Peanut-Containing Drug Products     I personally reviewed active problem list, medication list, allergies, family history, social history, health maintenance with the patient/caregiver today.   ROS  Constitutional: Negative for fever or weight change.  Respiratory: Negative for cough and shortness of breath.   Cardiovascular: Negative for chest pain or palpitations.  Gastrointestinal: Negative for abdominal pain, no bowel changes.  Musculoskeletal: Negative for gait problem or joint swelling.  Skin: Negative for rash.  Neurological: Negative for dizziness or headache.  No other specific complaints in a complete review of systems (except as listed in HPI above).  Objective  Vitals:   03/14/19 1124  BP: 116/74  Pulse: 93  Resp: 16  Temp: (!) 97.3 F (36.3 C)  TempSrc: Temporal  SpO2: 98%  Weight: 163 lb 11.2 oz (74.3 kg)  Height: '5\' 3"'  (1.6 m)    Body mass index is 29 kg/m.  Physical Exam  Constitutional: Patient appears well-developed and well-nourished. Overweight.  No distress.  HEENT: head atraumatic, normocephalic, pupils equal and reactive to light Cardiovascular: Normal rate, regular rhythm and normal heart sounds.  No  murmur heard. No BLE edema. Pulmonary/Chest: Effort normal and breath sounds normal. No respiratory distress. Abdominal: Soft.  There is no tenderness. Psychiatric: Patient has a normal mood and affect. behavior is normal. Judgment and thought content normal.  Recent Results (from the past 2160 hour(s))  Cervicovaginal ancillary only     Status: None   Collection Time: 03/02/19 10:20 AM  Result Value Ref Range   Neisseria Gonorrhea Negative    Chlamydia Negative    Trichomonas Negative    Comment Normal Reference Ranger Chlamydia - Negative    Comment      Normal Reference Range Neisseria Gonorrhea -  Negative   Comment Normal Reference Range Trichomonas - Negative   CBC     Status: None   Collection Time: 03/02/19 10:28 AM  Result Value Ref Range   WBC 7.2 3.4 - 10.8 x10E3/uL   RBC 3.84 3.77 - 5.28 x10E6/uL   Hemoglobin 12.3 11.1 - 15.9 g/dL   Hematocrit 36.3 34.0 - 46.6 %   MCV 95 79 - 97 fL   MCH 32.0 26.6 - 33.0 pg   MCHC 33.9 31.5 - 35.7 g/dL   RDW 12.6 11.7 - 15.4 %   Platelets 312 150 - 450 x10E3/uL  Comp Met (CMET)     Status: None   Collection Time: 03/02/19 10:28 AM  Result Value Ref Range   Glucose 93 65 - 99 mg/dL   BUN 16 6 - 20 mg/dL   Creatinine, Ser 0.76 0.57 - 1.00 mg/dL   GFR calc non Af Amer 106 >59 mL/min/1.73   GFR calc Af Amer 122 >59 mL/min/1.73   BUN/Creatinine Ratio 21 9 - 23   Sodium 141 134 - 144 mmol/L   Potassium 4.3 3.5 - 5.2 mmol/L   Chloride 105 96 - 106 mmol/L   CO2 24 20 - 29 mmol/L   Calcium 9.6 8.7 - 10.2 mg/dL   Total Protein 7.2 6.0 - 8.5 g/dL   Albumin 4.6 3.9 - 5.0 g/dL   Globulin, Total 2.6 1.5 - 4.5 g/dL   Albumin/Globulin Ratio 1.8 1.2 - 2.2   Bilirubin Total 0.2 0.0 - 1.2 mg/dL   Alkaline Phosphatase 76 39 - 117 IU/L   AST 14 0 - 40 IU/L   ALT 9 0 - 32 IU/L  Lipid panel     Status: None   Collection Time: 03/02/19 10:28 AM  Result Value Ref Range   Cholesterol, Total 146 100 - 199 mg/dL   Triglycerides 47 0 - 149 mg/dL    HDL 60 >39 mg/dL   VLDL Cholesterol Cal 10 5 - 40 mg/dL   LDL Chol Calc (NIH) 76 0 - 99 mg/dL   Chol/HDL Ratio 2.4 0.0 - 4.4 ratio    Comment:                                   T. Chol/HDL Ratio                                             Men  Women                               1/2 Avg.Risk  3.4    3.3                                   Avg.Risk  5.0    4.4                                2X Avg.Risk  9.6    7.1                                3X Avg.Risk 23.4  11.0   TSH     Status: None   Collection Time: 03/02/19 10:28 AM  Result Value Ref Range   TSH 2.690 0.450 - 4.500 uIU/mL  HIV Antibody (routine testing w rflx)     Status: None   Collection Time: 03/02/19 10:28 AM  Result Value Ref Range   HIV Screen 4th Generation wRfx Non Reactive Non Reactive  RPR     Status: None   Collection Time: 03/02/19 10:28 AM  Result Value Ref Range   RPR Ser Ql Non Reactive Non Reactive     PHQ2/9: Depression screen [facility] 2/9 03/14/2019 12/06/2018 11/11/2018 09/23/2018 08/30/2018  Decreased Interest 0 '1 1 1 ' 0  Down, Depressed, Hopeless 0 1 0 1 0  PHQ - 2 Score 0 '2 1 2 ' 0  Altered sleeping '3 1 2 3 ' 0  Tired, decreased energy '2 1 2 2 ' 0  Change in appetite 0 1 1 0 0  Feeling bad or failure about yourself  0 1 0 0 0  Trouble concentrating 0 1 0 0 0  Moving slowly or fidgety/restless 0 1 0 0 0  Suicidal thoughts 0 0 0 0 0  PHQ-9 Score '5 8 6 7 ' 0  Difficult doing work/chores Not difficult at all Somewhat difficult Somewhat difficult Not difficult at all Not difficult at all  Some recent data might be hidden    phq 9 is negative   Fall Risk: Fall Risk  03/14/2019 12/06/2018 11/11/2018 08/30/2018 06/11/2018  Falls in the past year? 0 0 0 0 0  Number falls in past yr: 0 0 0 0 -  Injury with Fall? 0 0 0 0 -  Follow up - Falls evaluation completed Falls evaluation completed Falls evaluation completed -     Functional Status Survey: Is the patient deaf or have difficulty hearing?: No Does the patient  have difficulty seeing, even when wearing glasses/contacts?: No Does the patient have difficulty concentrating, remembering, or making decisions?: No Does the patient have difficulty walking or climbing stairs?: No Does the patient have difficulty dressing or bathing?: No Does the patient have difficulty doing errands alone such as visiting a doctor's office or shopping?: No    Assessment & Plan  1. Perennial allergic rhinitis  - montelukast (SINGULAIR) 10 MG tablet; Take 1 tablet (10 mg total) by mouth at bedtime.  Dispense: 90 tablet; Refill: 0 - loratadine (CLARITIN) 10 MG tablet; Take 1 tablet (10 mg total) by mouth daily.  Dispense: 90 tablet; Refill: 0 - levocetirizine (XYZAL) 5 MG tablet; Take 1 tablet (5 mg total) by mouth every evening.  Dispense: 90 tablet; Refill: 0 - Azelastine HCl 137 MCG/SPRAY SOLN; Place 2 sprays into the nose daily.  Dispense: 90 mL; Refill: 1  2. Dysthymia  - DULoxetine (CYMBALTA) 60 MG capsule; Take 1 capsule (60 mg total) by mouth every morning.  Dispense: 90 capsule; Refill: 1  3. GAD (generalized anxiety disorder)  - DULoxetine (CYMBALTA) 60 MG capsule; Take 1 capsule (60 mg total) by mouth every morning.  Dispense: 90 capsule; Refill: 1  4. Psychophysiological insomnia  Advised to take hydroxizine qhs

## 2019-03-18 ENCOUNTER — Other Ambulatory Visit: Payer: Self-pay

## 2019-03-18 MED ORDER — NORETHIN ACE-ETH ESTRAD-FE 1-20 MG-MCG PO TABS: 1.0000 | ORAL_TABLET | Freq: Every day | ORAL | 4 refills | Status: DC

## 2019-04-18 ENCOUNTER — Encounter: Payer: Self-pay | Admitting: Family Medicine

## 2019-04-21 ENCOUNTER — Encounter: Payer: Self-pay | Admitting: Family Medicine

## 2019-04-21 ENCOUNTER — Other Ambulatory Visit: Payer: Self-pay

## 2019-04-21 ENCOUNTER — Ambulatory Visit (INDEPENDENT_AMBULATORY_CARE_PROVIDER_SITE_OTHER): Payer: 59 | Admitting: Family Medicine

## 2019-04-21 VITALS — Ht 63.0 in

## 2019-04-21 DIAGNOSIS — Z20828 Contact with and (suspected) exposure to other viral communicable diseases: Secondary | ICD-10-CM | POA: Diagnosis not present

## 2019-04-21 DIAGNOSIS — J029 Acute pharyngitis, unspecified: Secondary | ICD-10-CM

## 2019-04-21 DIAGNOSIS — Z20822 Contact with and (suspected) exposure to covid-19: Secondary | ICD-10-CM

## 2019-04-21 NOTE — Progress Notes (Signed)
Name: Patricia Horn   MRN: [id number]    DOB: 1991-02-27   Date:04/21/2019       Progress Note  Subjective  Chief Complaint  Chief Complaint  Patient presents with  . Sore Throat    Onset 3 days. Constant, scratchy, and pain when swallowing    I connected with  [name]  on 04/21/19 at  8:40 AM EST by a video enabled telemedicine application and verified that I am speaking with the correct person using two identifiers.  I discussed the limitations of evaluation and management by telemedicine and the availability of in person appointments. The patient expressed understanding and agreed to proceed. Staff also discussed with the patient that there may be a patient responsible charge related to this service. Patient Location: at home Provider Location: [facility]   HPI  Sore throat: she states woke up on Monday, she was exposed to COVID-19 at work one week ago, she got tested on Monday and test was negative. She has scratchy throat, mild discomfort when swallowing, no fever, chills, rashes, change in appetite. Yesterday she felt some chest congestion but is feeling better today. She is drinking throat coat tea, honey and is self isolating. She is going back for COVID-19 re-testing today and if negative and chest congestion gets worse we will send doxy to her pharmacy otherwise continue otc regiment and tea. Offered magic mouthwash but she declined at this time. Discussed time of self isolation - to continue for 14 days , unless second test negative today.    Patient Active Problem List   Diagnosis Date Noted  . Uterine leiomyoma 03/02/2019  . Overweight (BMI 25.0-29.9) 03/02/2019  . Dysthymia 11/11/2018  . Bilateral hand pain 12/28/2017  . Right carpal tunnel syndrome 11/10/2016  . Urticaria 11/09/2015  . Umbilical cyst [id number]  . Perennial allergic rhinitis 11/16/2014  . ANA positive 10/06/2014  . Anxiety 10/06/2014    Past Surgical History:  Procedure  Laterality Date  . HERNIA REPAIR  [id number]   umbilical   . MYOMECTOMY  2015    Family History  Problem Relation Age of Onset  . Hypertension Mother   . Scleroderma Mother   . Pulmonary fibrosis Mother   . Asthma Mother   . Cancer Neg Hx     Social History   Socioeconomic History  . Marital status: Single    Spouse name: Not on file  . Number of children: 0  . Years of education: Not on file  . Highest education level: Master's degree (e.g., MA, MS, MEng, MEd, MSW, MBA)  Occupational History  . 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 Smoking status: Never Smoker  . Smokeless tobacco: Never Used  Substance and Sexual Activity  . Alcohol use: Yes    Alcohol/week: 1.0 standard drinks    Types: 1 Standard drinks or equivalent per week    Comment: ocassional  . Drug use: No  . Sexual activity: Yes    Partners: Male    Birth control/protection: I.U.D., Condom  Other Topics Concern  . 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    Social Determinants of Health   Financial Resource Strain: Low Risk   . Difficulty of Paying Living Expenses: Not hard at  all  Food Insecurity: No Food Insecurity  . Worried About Charity fundraiser in the Last Year: Never true  . Ran Out of Food in the Last Year: Never true  Transportation Needs: No Transportation Needs  . Lack of Transportation (Medical): No  . Lack of Transportation (Non-Medical): No  Physical Activity: Unknown  . Days of Exercise per Week: 4 days  . Minutes of Exercise per Session: Not on file  Stress: Stress Concern Present  . Feeling of Stress : Very much  Social Connections: Slightly Isolated  . Frequency of Communication with Friends and Family: More than three times a week  . Frequency of Social Gatherings with Friends and Family: More  than three times a week  . Attends Religious Services: More than 4 times per year  . Active Member of Clubs or Organizations: Yes  . Attends Archivist Meetings: More than 4 times per year  . Marital Status: Never married  Intimate Partner Violence: Not At Risk  . Fear of Current or Ex-Partner: No  . Emotionally Abused: No  . Physically Abused: No  . Sexually Abused: No     Current Outpatient Medications:  .  Azelastine HCl 137 MCG/SPRAY SOLN, Place 2 sprays into the nose daily., Disp: 90 mL, Rfl: 1 .  DULoxetine (CYMBALTA) 60 MG capsule, Take 1 capsule (60 mg total) by mouth every morning., Disp: 90 capsule, Rfl: 1 .  fluticasone (FLONASE) 50 MCG/ACT nasal spray, Place 2 sprays into both nostrils daily., Disp: 16 g, Rfl: 1 .  hydrOXYzine (ATARAX/VISTARIL) 10 MG tablet, Take 1-2 tablets (10-20 mg total) by mouth 3 (three) times daily as needed., Disp: 90 tablet, Rfl: 0 .  levocetirizine (XYZAL) 5 MG tablet, Take 1 tablet (5 mg total) by mouth every evening., Disp: 90 tablet, Rfl: 0 .  loratadine (CLARITIN) 10 MG tablet, Take 1 tablet (10 mg total) by mouth daily., Disp: 90 tablet, Rfl: 0 .  montelukast (SINGULAIR) 10 MG tablet, Take 1 tablet (10 mg total) by mouth at bedtime., Disp: 90 tablet, Rfl: 0 .  norethindrone-ethinyl estradiol (JUNEL FE 1/20) 1-20 MG-MCG tablet, Take 1 tablet by mouth daily., Disp: 3 Package, Rfl: 4  Allergies  Allergen Reactions  . Peanut Oil Swelling    Eyelids swell.  . Peanut-Containing Drug Products     I personally reviewed active problem list, medication list, allergies, family history, social history, health maintenance with the patient/caregiver today.   ROS  Ten systems reviewed and is negative except as mentioned in HPI   Objective  Virtual encounter, vitals not obtained.  Body mass index is 29 kg/m.  Physical Exam  Awake, alert and oriented, throat mild erythema no pus  PHQ2/9: Depression screen [facility] 2/9 04/21/2019  03/14/2019 12/06/2018 11/11/2018 09/23/2018  Decreased Interest 0 0 1 1 1   Down, Depressed, Hopeless 0 0 1 0 1  PHQ - 2 Score 0 0 2 1 2   Altered sleeping 0 3 1 2 3   Tired, decreased energy 0 2 1 2 2   Change in appetite 0 0 1 1 0  Feeling bad or failure about yourself  0 0 1 0 0  Trouble concentrating 0 0 1 0 0  Moving slowly or fidgety/restless 0 0 1 0 0  Suicidal thoughts 0 0 0 0 0  PHQ-9 Score 0 5 8 6 7   Difficult doing work/chores Not difficult at all Not difficult at all Somewhat difficult Somewhat difficult Not difficult at all  Some recent data might  be hidden   PHQ-2/9 Result is negative.    Fall Risk: Fall Risk  04/21/2019 03/14/2019 12/06/2018 11/11/2018 08/30/2018  Falls in the past year? 0 0 0 0 0  Number falls in past yr: 0 0 0 0 0  Injury with Fall? 0 0 0 0 0  Follow up - - Falls evaluation completed Falls evaluation completed Falls evaluation completed    Assessment & Plan  1. Sore throat  Continue otc medication and tea  2. Exposure to COVID-19 virus  She is getting re-tested today  I discussed the assessment and treatment plan with the patient. The patient was provided an opportunity to ask questions and all were answered. The patient agreed with the plan and demonstrated an understanding of the instructions.  The patient was advised to call back or seek an in-person evaluation if the symptoms worsen or if the condition fails to improve as anticipated.  I provided 15  minutes of non-face-to-face time during this encounter.

## 2019-04-21 NOTE — Patient Instructions (Signed)
    Person Under Monitoring Name: Patricia Horn  Location: [place] [address]   CORONAVIRUS DISEASE 2019 (COVID-19) Guidance for Persons Under Investigation You are being tested for the virus that causes coronavirus disease 2019 (COVID-19). Public health actions are necessary to ensure protection of your health and the health of others, and to prevent further spread of infection. COVID-19 is caused by a virus that can cause symptoms, such as fever, cough, and shortness of breath. The primary transmission from person to person is by coughing or sneezing. On June 03, 2018, the [place] announced a [employer] Emergency of International Concern and on June 04, 2018 the U.S. Department of Health and Human Services declared a public health emergency. If the virus that causesCOVID-19 spreads in the community, it could have severe public health consequences.  As a person under investigation for COVID-19, the [place] advises you to adhere to the following guidance until your test results are reported to you. If your test result is positive, you will receive additional information from your provider and your local health department at that time.   Remain at home until you are cleared by your health provider or public health authorities.   Keep a log of visitors to your home using the form provided. Any visitors to your home must be aware of your isolation status.  If you plan to move to a new address or leave the county, notify the local health department in your county.  Call a doctor or seek care if you have an urgent medical need. Before seeking medical care, call ahead and get instructions from the provider before arriving at the medical office, clinic or hospital. Notify them that you are being tested for the virus that causes COVID-19 so arrangements can be made, as necessary, to  prevent transmission to others in the healthcare setting. Next, notify the local health department in your county.  If a medical emergency arises and you need to call 911, inform the first responders that you are being tested for the virus that causes COVID-19. Next, notify the local health department in your county.  Adhere to all guidance set forth by the [place] for [facility] of patients that is based on guidance from the Center for Disease Control and Prevention with suspected or confirmed COVID-19. It is provided with this guidance for Persons Under Investigation.  Your health and the health of our community are our top priorities. Public Health officials remain available to provide assistance and counseling to you about COVID-19 and compliance with this guidance.  Provider: ____________________________________________________________ Date: ______/_____/_________  By signing below, you acknowledge that you have read and agree to comply with this Guidance for Persons Under Investigation. ______________________________________________________________ Date: ______/_____/_________  WHO DO I CALL? You can find a list of local health departments here: [email or web address] Health Department: ____________________________________________________________________ Contact Name: ________________________________________________________________________ Telephone: ___________________________________________________________________________  Marice Potter, [place], Communicable Disease Branch COVID-19 Guidance for Persons Under Investigation July 10, 2018

## 2019-04-24 ENCOUNTER — Encounter: Payer: Self-pay | Admitting: Family Medicine

## 2019-04-25 ENCOUNTER — Other Ambulatory Visit: Payer: Self-pay | Admitting: Family Medicine

## 2019-04-25 MED ORDER — DOXYCYCLINE HYCLATE 100 MG PO TABS: 100.0000 mg | ORAL_TABLET | Freq: Two times a day (BID) | ORAL | 0 refills | Status: DC

## 2019-05-08 ENCOUNTER — Encounter: Payer: Self-pay | Admitting: Family Medicine

## 2019-06-14 ENCOUNTER — Other Ambulatory Visit: Payer: Self-pay | Admitting: Family Medicine

## 2019-06-14 ENCOUNTER — Encounter: Payer: 59 | Admitting: Family Medicine

## 2019-06-14 ENCOUNTER — Encounter: Payer: Self-pay | Admitting: Family Medicine

## 2019-06-15 ENCOUNTER — Other Ambulatory Visit: Payer: Self-pay | Admitting: Family Medicine

## 2019-06-15 DIAGNOSIS — R5383 Other fatigue: Secondary | ICD-10-CM

## 2019-06-16 ENCOUNTER — Encounter: Payer: Self-pay | Admitting: Family Medicine

## 2019-06-16 LAB — CBC WITH DIFFERENTIAL/PLATELET
Absolute Monocytes: 413 cells/uL (ref 200–950)
Basophils Absolute: 17 cells/uL (ref 0–200)
Basophils Relative: 0.2 %
Eosinophils Absolute: 103 cells/uL (ref 15–500)
Eosinophils Relative: 1.2 %
HCT: 38.3 % (ref 35.0–45.0)
Hemoglobin: 13 g/dL (ref 11.7–15.5)
Lymphs Abs: 3285 cells/uL (ref 850–3900)
MCH: 31.9 pg (ref 27.0–33.0)
MCHC: 33.9 g/dL (ref 32.0–36.0)
MCV: 93.9 fL (ref 80.0–100.0)
MPV: 9.6 fL (ref 7.5–12.5)
Monocytes Relative: 4.8 %
Neutro Abs: 4782 cells/uL (ref 1500–7800)
Neutrophils Relative %: 55.6 %
Platelets: 380 10*3/uL (ref 140–400)
RBC: 4.08 10*6/uL (ref 3.80–5.10)
RDW: 11.7 % (ref 11.0–15.0)
Total Lymphocyte: 38.2 %
WBC: 8.6 10*3/uL (ref 3.8–10.8)

## 2019-06-16 LAB — COMPLETE METABOLIC PANEL WITH GFR
AG Ratio: 1.6 (calc) (ref 1.0–2.5)
ALT: 15 U/L (ref 6–29)
AST: 17 U/L (ref 10–30)
Albumin: 4.2 g/dL (ref 3.6–5.1)
Alkaline phosphatase (APISO): 56 U/L (ref 31–125)
BUN: 13 mg/dL (ref 7–25)
CO2: 27 mmol/L (ref 20–32)
Calcium: 9.2 mg/dL (ref 8.6–10.2)
Chloride: 104 mmol/L (ref 98–110)
Creat: 0.84 mg/dL (ref 0.50–1.10)
GFR, Est African American: 108 mL/min/{1.73_m2} (ref >=60)
GFR, Est Non African American: 93 mL/min/{1.73_m2} (ref >=60)
Globulin: 2.7 g/dL (calc) (ref 1.9–3.7)
Glucose, Bld: 98 mg/dL (ref 65–99)
Potassium: 3.9 mmol/L (ref 3.5–5.3)
Sodium: 138 mmol/L (ref 135–146)
Total Bilirubin: 0.2 mg/dL (ref 0.2–1.2)
Total Protein: 6.9 g/dL (ref 6.1–8.1)

## 2019-06-16 LAB — VITAMIN D 25 HYDROXY (VIT D DEFICIENCY, FRACTURES): Vit D, 25-Hydroxy: 25 ng/mL — ABNORMAL LOW (ref 30–100)

## 2019-06-16 LAB — B12 AND FOLATE PANEL
Folate: 20 ng/mL
Vitamin B-12: 643 pg/mL (ref 200–1100)

## 2019-06-16 LAB — THYROID PANEL WITH TSH
Free Thyroxine Index: 2 (ref 1.4–3.8)
T3 Uptake: 18 % — ABNORMAL LOW (ref 22–35)
T4, Total: 11.3 ug/dL (ref 5.1–11.9)
TSH: 2.01 mIU/L

## 2019-07-12 ENCOUNTER — Encounter: Payer: Self-pay | Admitting: Family Medicine

## 2019-07-12 ENCOUNTER — Ambulatory Visit (INDEPENDENT_AMBULATORY_CARE_PROVIDER_SITE_OTHER): Payer: 59 | Admitting: Family Medicine

## 2019-07-12 DIAGNOSIS — F330 Major depressive disorder, recurrent, mild: Secondary | ICD-10-CM

## 2019-07-12 DIAGNOSIS — F5104 Psychophysiologic insomnia: Secondary | ICD-10-CM

## 2019-07-12 DIAGNOSIS — F411 Generalized anxiety disorder: Secondary | ICD-10-CM

## 2019-07-12 DIAGNOSIS — J3089 Other allergic rhinitis: Secondary | ICD-10-CM | POA: Diagnosis not present

## 2019-07-12 MED ORDER — LEVOCETIRIZINE DIHYDROCHLORIDE 5 MG PO TABS: 5.0000 mg | ORAL_TABLET | Freq: Every evening | ORAL | 0 refills | Status: DC

## 2019-07-12 MED ORDER — FLUTICASONE PROPIONATE 50 MCG/ACT NA SUSP: 2.0000 | Freq: Every day | NASAL | 1 refills | Status: DC

## 2019-07-12 MED ORDER — LORATADINE 10 MG PO TABS: 10.0000 mg | ORAL_TABLET | Freq: Every day | ORAL | 0 refills | Status: DC

## 2019-07-12 MED ORDER — TRAZODONE HCL 50 MG PO TABS: 25.0000 mg | ORAL_TABLET | Freq: Every evening | ORAL | 0 refills | Status: DC | PRN

## 2019-07-12 NOTE — Progress Notes (Signed)
Name: Patricia Horn   MRN: [id number]    DOB: 1990/07/23   Date:07/12/2019       Progress Note  Subjective  Chief Complaint  Chief Complaint  Patient presents with  . Medication Refill    4 month F/U  . Allergies  . Obesity  . Depression    Sleeping issues-staying asleep  . GAD    I connected with  [name]  on 07/12/19 at 10:40 AM EST by a video enabled telemedicine application and verified that I am speaking with the correct person using two identifiers.  I discussed the limitations of evaluation and management by telemedicine and the availability of in person appointments. The patient expressed understanding and agreed to proceed. Staff also discussed with the patient that there may be a patient responsible charge related to this service. Patient Location: at work  Provider Location: [facility]   HPI  Depression/GAD: she has a long history of anxiety and also episodes of dysthymia in the past. She felt much worse after dog died in 08/03/2017, mother in 05-Jun-2018, followed by COVID-19. She was doing okay however from Dec through recently she was feeling much worse, no motivation some suicidal ideation ( without planning) feeling extremely tired, she reached out and labs were normal. She has noticed that once the sun came out her mood improved. She is feeling much better now. She states she had spaced out her visit with therapist and noticed that he is very helpful.  She is taking Duloxetine and seeing [name] therapist. Discussed UV lamps and to be aware of this changes for next year, maybe have sessions every week through the dark months.   Insomnia: she has noticed difficulty staying asleep, it has been going on for the past year, since her mother died June 05, 2018. She sometimes is able to fall asleep within 10 minutes but it can take up to 2 hours. She tried Nyquil and melatonin gummies but does not keep her asleep. She has noticed irritability because she is always .  Discussed Trazodone   Allergies: Hasclear rhinorrhea, nasal congestion, but no pruritus. No cough or wheezing at this time. She is using flonase and azelastine along with claritin, xyzal, and singulair daily. She has noticed mild increase of symptoms because of Spring coming. She had some post-nasal drainage and mild sore throat but improving now.   Patient Active Problem List   Diagnosis Date Noted  . Uterine leiomyoma 03/02/2019  . Overweight (BMI 25.0-29.9) 03/02/2019  . Dysthymia 11/11/2018  . Bilateral hand pain 12/28/2017  . Right carpal tunnel syndrome 11/10/2016  . Urticaria 11/09/2015  . Umbilical cyst [id number]  . Perennial allergic rhinitis 11/16/2014  . ANA positive 10/06/2014  . Anxiety 10/06/2014    Past Surgical History:  Procedure Laterality Date  . HERNIA REPAIR  [id number]   umbilical   . MYOMECTOMY  August 03, 2013    Family History  Problem Relation Age of Onset  . Hypertension Mother   . Scleroderma Mother   . Pulmonary fibrosis Mother   . Asthma Mother   . Cancer Neg Hx     Social History   Socioeconomic History  . Marital status: Single    Spouse name: Not on file  . Number of children: 0  . Years of education: Not on file  . Highest education level: Master's degree (e.g., MA, MS, MEng, MEd, MSW, MBA)  Occupational History  . 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 Smoking status: Never Smoker  . Smokeless tobacco: Never Used  Substance and Sexual Activity  . Alcohol use: Yes    Alcohol/week: 1.0 standard drinks    Types: 1 Standard drinks or equivalent per week    Comment: ocassional  . Drug use: No  . Sexual activity: Yes    Partners: Male    Birth control/protection: I.U.D., Condom  Other Topics Concern  . 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    Social  Determinants of Health   Financial Resource Strain:   . Difficulty of Paying Living Expenses: Not on file  Food Insecurity: No Food Insecurity  . Worried About Charity fundraiser in the Last Year: Never true  . Ran Out of Food in the Last Year: Never true  Transportation Needs: No Transportation Needs  . Lack of Transportation (Medical): No  . Lack of Transportation (Non-Medical): No  Physical Activity: Unknown  . Days of Exercise per Week: 4 days  . Minutes of Exercise per Session: Not on file  Stress: Stress Concern Present  . Feeling of Stress : Very much  Social Connections: Slightly Isolated  . Frequency of Communication with Friends and Family: More than three times a week  . Frequency of Social Gatherings with Friends and Family: More than three times a week  . Attends Religious Services: More than 4 times per year  . Active Member of Clubs or Organizations: Yes  . Attends Archivist Meetings: More than 4 times per year  . Marital Status: Never married  Intimate Partner Violence:   . Fear of Current or Ex-Partner: Not on file  . Emotionally Abused: Not on file  . Physically Abused: Not on file  . Sexually Abused: Not on file     Current Outpatient Medications:  .  Azelastine HCl 137 MCG/SPRAY SOLN, Place 2 sprays into the nose daily., Disp: 90 mL, Rfl: 1 .  DULoxetine (CYMBALTA) 60 MG capsule, Take 1 capsule (60 mg total) by mouth every morning., Disp: 90 capsule, Rfl: 1 .  fluticasone (FLONASE) 50 MCG/ACT nasal spray, Place 2 sprays into both nostrils daily., Disp: 16 g, Rfl: 1 .  hydrOXYzine (ATARAX/VISTARIL) 10 MG tablet, Take 1-2 tablets (10-20 mg total) by mouth 3 (three) times daily as needed., Disp: 90 tablet, Rfl: 0 .  levocetirizine (XYZAL) 5 MG tablet, Take 1 tablet (5 mg total) by mouth every evening., Disp: 90 tablet, Rfl: 0 .  loratadine (CLARITIN) 10 MG tablet, Take 1 tablet (10 mg total) by mouth daily., Disp: 90 tablet, Rfl: 0 .  montelukast  (SINGULAIR) 10 MG tablet, Take 1 tablet (10 mg total) by mouth at bedtime., Disp: 90 tablet, Rfl: 0 .  norethindrone-ethinyl estradiol (JUNEL FE 1/20) 1-20 MG-MCG tablet, Take 1 tablet by mouth daily., Disp: 3 Package, Rfl: 4  Allergies  Allergen Reactions  . Peanut Oil Swelling    Eyelids swell.  . Peanut-Containing Drug Products     I personally reviewed active problem list, medication list, allergies, family history, social history, health maintenance with the patient/caregiver today.   ROS  Ten systems reviewed and is negative except as mentioned in HPI   Objective  Virtual encounter, vitals not obtained.  There is no height or weight on file to calculate BMI.  Physical  Exam  Awake, alert and oriented  PHQ2/9: Depression screen [facility] 2/9 07/12/2019 04/21/2019 03/14/2019 12/06/2018 11/11/2018  Decreased Interest 0 0 0 1 1  Down, Depressed, Hopeless 0 0 0 1 0  PHQ - 2 Score 0 0 0 2 1  Altered sleeping 3 0 3 1 2   Tired, decreased energy 3 0 2 1 2   Change in appetite 0 0 0 1 1  Feeling bad or failure about yourself  0 0 0 1 0  Trouble concentrating 0 0 0 1 0  Moving slowly or fidgety/restless 0 0 0 1 0  Suicidal thoughts 0 0 0 0 0  PHQ-9 Score 6 0 5 8 6   Difficult doing work/chores Not difficult at all Not difficult at all Not difficult at all Somewhat difficult Somewhat difficult  Some recent data might be hidden   PHQ-2/9 Result is positive.    Fall Risk: Fall Risk  07/12/2019 04/21/2019 03/14/2019 12/06/2018 11/11/2018  Falls in the past year? 0 0 0 0 0  Number falls in past yr: 0 0 0 0 0  Injury with Fall? 0 0 0 0 0  Follow up - - - Falls evaluation completed Falls evaluation completed     Assessment & Plan  1. Perennial allergic rhinitis  - fluticasone (FLONASE) 50 MCG/ACT nasal spray; Place 2 sprays into both nostrils daily.  Dispense: 16 g; Refill: 1 - levocetirizine (XYZAL) 5 MG tablet; Take 1 tablet (5 mg total) by mouth every evening.  Dispense: 90 tablet; Refill:  0 - loratadine (CLARITIN) 10 MG tablet; Take 1 tablet (10 mg total) by mouth daily.  Dispense: 90 tablet; Refill: 0  2. GAD (generalized anxiety disorder)  Taking hydroxyzine prn only   3. MDD (major depressive disorder), recurrent episode, mild ([place])  Keep counseling and medication   4. Psychophysiological insomnia  We will try medication, advised to titrate up to 100 mg and to start on Friday - traZODone (DESYREL) 50 MG tablet; Take 0.5-1 tablets (25-50 mg total) by mouth at bedtime as needed for sleep.  Dispense: 30 tablet; Refill: 0  I discussed the assessment and treatment plan with the patient. The patient was provided an opportunity to ask questions and all were answered. The patient agreed with the plan and demonstrated an understanding of the instructions.  The patient was advised to call back or seek an in-person evaluation if the symptoms worsen or if the condition fails to improve as anticipated.  I provided 39minutes of non-face-to-face time during this encounter.

## 2019-09-08 ENCOUNTER — Other Ambulatory Visit: Payer: Self-pay | Admitting: Family Medicine

## 2019-09-08 ENCOUNTER — Telehealth: Payer: Self-pay | Admitting: *Deleted

## 2019-09-08 ENCOUNTER — Other Ambulatory Visit: Payer: Self-pay | Admitting: *Deleted

## 2019-09-08 DIAGNOSIS — F5104 Psychophysiologic insomnia: Secondary | ICD-10-CM

## 2019-09-08 DIAGNOSIS — J3089 Other allergic rhinitis: Secondary | ICD-10-CM

## 2019-09-08 MED ORDER — TRAZODONE HCL 50 MG PO TABS: 25.0000 mg | ORAL_TABLET | Freq: Every evening | ORAL | 0 refills | Status: DC | PRN

## 2019-09-08 MED ORDER — TRAZODONE HCL 50 MG PO TABS
25.0000 mg | ORAL_TABLET | Freq: Every evening | ORAL | 0 refills | Status: DC | PRN
Start: 2019-09-08 — End: 2019-09-08

## 2019-09-08 NOTE — Telephone Encounter (Signed)
This Rx defaults to "print" mode instead of "normal" so I am not able to transmit it to the pharmacy.

## 2019-09-12 ENCOUNTER — Other Ambulatory Visit: Payer: Self-pay | Admitting: Family Medicine

## 2019-09-12 DIAGNOSIS — F5104 Psychophysiologic insomnia: Secondary | ICD-10-CM

## 2019-10-19 ENCOUNTER — Ambulatory Visit: Payer: 59 | Admitting: Family Medicine

## 2019-10-19 ENCOUNTER — Other Ambulatory Visit: Payer: Self-pay

## 2019-10-19 ENCOUNTER — Encounter: Payer: Self-pay | Admitting: Family Medicine

## 2019-10-19 VITALS — BP 132/76 | HR 95 | Temp 97.8°F | Resp 16 | Ht 63.0 in | Wt 174.5 lb

## 2019-10-19 DIAGNOSIS — F5104 Psychophysiologic insomnia: Secondary | ICD-10-CM

## 2019-10-19 DIAGNOSIS — M79671 Pain in right foot: Secondary | ICD-10-CM | POA: Diagnosis not present

## 2019-10-19 DIAGNOSIS — F411 Generalized anxiety disorder: Secondary | ICD-10-CM

## 2019-10-19 DIAGNOSIS — J3089 Other allergic rhinitis: Secondary | ICD-10-CM | POA: Diagnosis not present

## 2019-10-19 DIAGNOSIS — F341 Dysthymic disorder: Secondary | ICD-10-CM

## 2019-10-19 DIAGNOSIS — Z9181 History of falling: Secondary | ICD-10-CM

## 2019-10-19 MED ORDER — AZELASTINE HCL 137 MCG/SPRAY NA SOLN: 2.0000 | Freq: Every day | NASAL | 1 refills | Status: DC

## 2019-10-19 MED ORDER — TRAZODONE HCL 50 MG PO TABS: 25.0000 mg | ORAL_TABLET | Freq: Every evening | ORAL | 0 refills | Status: DC | PRN

## 2019-10-19 MED ORDER — MONTELUKAST SODIUM 10 MG PO TABS: 10.0000 mg | ORAL_TABLET | Freq: Every day | ORAL | 1 refills | Status: DC

## 2019-10-19 MED ORDER — FLUTICASONE PROPIONATE 50 MCG/ACT NA SUSP: 2.0000 | Freq: Every day | NASAL | 1 refills | Status: DC

## 2019-10-19 MED ORDER — MELOXICAM 15 MG PO TABS: 15.0000 mg | ORAL_TABLET | Freq: Every day | ORAL | 0 refills | Status: DC

## 2019-10-19 MED ORDER — DULOXETINE HCL 60 MG PO CPEP: 60.0000 mg | ORAL_CAPSULE | ORAL | 1 refills | Status: DC

## 2019-10-19 NOTE — Progress Notes (Signed)
Name: Patricia Horn   MRN: [id number]    DOB: 06/04/1990   Date:10/19/2019       Progress Note  Subjective  Chief Complaint  Chief Complaint  Patient presents with   Follow-up    GAD   Ankle Injury    Right/fall on Sunday    HPI   Depression/GAD:  She is taking Duloxetine and seeing [name] therapist. She denies feeling sad but she was cutting chicken and for a second she had a thought of cutting her wrist. She states she would never do that since she has a dog to care for and also knows she would make her family upset. She states she texted her friends right away. She has two friends that know about her depression and anxiety and they are always available and she feels supported. Discussed suicide hotline She is still having counseling monthly   Insomnia: she has noticed difficulty staying asleep, it has been going on for the past year, since her mother died 06-23-2018. She tried Nyquil and melatonin gummies but does not keep her asleep. She has noticed irritability because she is always . She has been taking Trazodone half pill and has been working well, able to fall and stay asleep   Allergies: Hasclear rhinorrhea, nasal congestion, but no pruritus. No cough or wheezing at this time. She is using flonase and azelastine along with claritin, xyzal, and singulair daily. She recently went to urgent care diagnosed with sinusitis and was given antibiotics, she is doing well now   Recent Fall on 0613/2021 : she was wearing flip floors going down her stairs, and slipped and fell while carrying things in her hands - bottom 6 steps or so. .She states she did not pass out, but developed an immediate headache. She states buttocks and right ankle pain . She states the only symptomshe has now is right ankle pain is aching, no swelling or redness.   Patient Active Problem List   Diagnosis Date Noted   Uterine leiomyoma 03/02/2019   Overweight (BMI 25.0-29.9) 03/02/2019   Dysthymia  11/11/2018   Bilateral hand pain 12/28/2017   Right carpal tunnel syndrome 11/10/2016   Urticaria 15/17/6160   Umbilical cyst 73/71/0626   Perennial allergic rhinitis 11/16/2014   ANA positive 10/06/2014   Anxiety 10/06/2014    Past Surgical History:  Procedure Laterality Date   HERNIA REPAIR  9485   umbilical    MYOMECTOMY  2015    Family History  Problem Relation Age of Onset   Hypertension Mother    Scleroderma Mother    Pulmonary fibrosis Mother    Asthma Mother    Cancer Neg Hx     Social History   Tobacco Use   Smoking status: Never Smoker   Smokeless tobacco: Never Used  Substance Use Topics   Alcohol use: Yes    Alcohol/week: 1.0 standard drink    Types: 1 Standard drinks or equivalent per week    Comment: ocassional     Current Outpatient Medications:    Azelastine HCl 137 MCG/SPRAY SOLN, Place 2 sprays into the nose daily., Disp: 90 mL, Rfl: 1   DULoxetine (CYMBALTA) 60 MG capsule, Take 1 capsule (60 mg total) by mouth every morning., Disp: 90 capsule, Rfl: 1   fluticasone (FLONASE) 50 MCG/ACT nasal spray, Place 2 sprays into both nostrils daily., Disp: 48 g, Rfl: 1   hydrOXYzine (ATARAX/VISTARIL) 10 MG tablet, Take 1-2 tablets (10-20 mg total) by mouth 3 (three) times daily as  needed., Disp: 90 tablet, Rfl: 0   levocetirizine (XYZAL) 5 MG tablet, Take 1 tablet (5 mg total) by mouth every evening., Disp: 90 tablet, Rfl: 0   loratadine (CLARITIN) 10 MG tablet, Take 1 tablet (10 mg total) by mouth daily., Disp: 90 tablet, Rfl: 0   montelukast (SINGULAIR) 10 MG tablet, Take 1 tablet (10 mg total) by mouth at bedtime., Disp: 90 tablet, Rfl: 1   norethindrone-ethinyl estradiol (JUNEL FE 1/20) 1-20 MG-MCG tablet, Take 1 tablet by mouth daily., Disp: 3 Package, Rfl: 4   traZODone (DESYREL) 50 MG tablet, Take 0.5-1 tablets (25-50 mg total) by mouth at bedtime as needed. for sleep, Disp: 90 tablet, Rfl: 0   meloxicam (MOBIC) 15 MG tablet,  Take 1 tablet (15 mg total) by mouth daily., Disp: 30 tablet, Rfl: 0  Allergies  Allergen Reactions   Peanut Oil Swelling    Eyelids swell.   Peanut-Containing Drug Products     I personally reviewed active problem list, medication list, allergies, family history, social history, health maintenance with the patient/caregiver today.   ROS  Constitutional: Negative for fever or weight change.  Respiratory: Negative for cough and shortness of breath.   Cardiovascular: Negative for chest pain or palpitations.  Gastrointestinal: Negative for abdominal pain, no bowel changes.  Musculoskeletal: Negative for gait problem or joint swelling.  Skin: Negative for rash.  Neurological: Negative for dizziness or headache.  No other specific complaints in a complete review of systems (except as listed in HPI above).  Objective  Vitals:   10/19/19 1006  BP: 132/76  Pulse: 95  Resp: 16  Temp: 97.8 F (36.6 C)  TempSrc: Temporal  SpO2: 100%  Weight: 174 lb 8 oz (79.2 kg)  Height: 5\' 3"  (1.6 m)    Body mass index is 30.91 kg/m.  Physical Exam  Constitutional: Patient appears well-developed and well-nourished. Obese  No distress.  HEENT: head atraumatic, normocephalic, pupils equal and reactive to light,  neck supple Cardiovascular: Normal rate, regular rhythm and normal heart sounds.  No murmur heard. No BLE edema. Pulmonary/Chest: Effort normal and breath sounds normal. No respiratory distress. Abdominal: Soft.  There is no tenderness. Muscular Skeletal: pain during palpation of dorsal foot no redness or increase in warmth, no swelling, pain with dorsiflexion and external rotation  Psychiatric: Patient has a normal mood and affect. behavior is normal. Judgment and thought content normal.  PHQ2/9: Depression screen [facility] 2/9 10/19/2019 07/12/2019 04/21/2019 03/14/2019 12/06/2018  Decreased Interest 0 0 0 0 1  Down, Depressed, Hopeless 0 0 0 0 1  PHQ - 2 Score 0 0 0 0 2  Altered sleeping  0 3 0 3 1  Tired, decreased energy 0 3 0 2 1  Change in appetite 0 0 0 0 1  Feeling bad or failure about yourself  0 0 0 0 1  Trouble concentrating 0 0 0 0 1  Moving slowly or fidgety/restless 0 0 0 0 1  Suicidal thoughts 1 0 0 0 0  PHQ-9 Score 1 6 0 5 8  Difficult doing work/chores Not difficult at all Not difficult at all Not difficult at all Not difficult at all Somewhat difficult  Some recent data might be hidden    phq 9 is positive  GAD 7 : Generalized Anxiety Score 07/12/2019 03/14/2019 11/11/2018 09/23/2018  Nervous, Anxious, on Edge 0 1 1 3   Control/stop worrying 0 2 3 3   Worry too much - different things 1 2 3 3   Trouble relaxing  0 0 1 1  Restless 0 0 0 0  Easily annoyed or irritable 3 2 2 1   Afraid - awful might happen 0 1 0 2  Total GAD 7 Score 4 8 10 13   Anxiety Difficulty Somewhat difficult Somewhat difficult Somewhat difficult Not difficult at all     Fall Risk: Fall Risk  10/19/2019 07/12/2019 04/21/2019 03/14/2019 12/06/2018  Falls in the past year? 1 0 0 0 0  Number falls in past yr: 0 0 0 0 0  Injury with Fall? 1 0 0 0 0  Follow up - - - - Falls evaluation completed     Functional Status Survey: Is the patient deaf or have difficulty hearing?: No Does the patient have difficulty seeing, even when wearing glasses/contacts?: No Does the patient have difficulty concentrating, remembering, or making decisions?: No Does the patient have difficulty walking or climbing stairs?: No Does the patient have difficulty dressing or bathing?: No Does the patient have difficulty doing errands alone such as visiting a doctor's office or shopping?: No    Assessment & Plan  1. Acute pain of right foot  - meloxicam (MOBIC) 15 MG tablet; Take 1 tablet (15 mg total) by mouth daily.  Dispense: 30 tablet; Refill: 0  2. History of recent fall  - meloxicam (MOBIC) 15 MG tablet; Take 1 tablet (15 mg total) by mouth daily.  Dispense: 30 tablet; Refill: 0  3. Psychophysiological  insomnia  - traZODone (DESYREL) 50 MG tablet; Take 0.5-1 tablets (25-50 mg total) by mouth at bedtime as needed. for sleep  Dispense: 90 tablet; Refill: 0  4. Perennial allergic rhinitis  - montelukast (SINGULAIR) 10 MG tablet; Take 1 tablet (10 mg total) by mouth at bedtime.  Dispense: 90 tablet; Refill: 1 - Azelastine HCl 137 MCG/SPRAY SOLN; Place 2 sprays into the nose daily.  Dispense: 90 mL; Refill: 1 - fluticasone (FLONASE) 50 MCG/ACT nasal spray; Place 2 sprays into both nostrils daily.  Dispense: 48 g; Refill: 1  5. Dysthymia  - DULoxetine (CYMBALTA) 60 MG capsule; Take 1 capsule (60 mg total) by mouth every morning.  Dispense: 90 capsule; Refill: 1  6. GAD (generalized anxiety disorder)  - DULoxetine (CYMBALTA) 60 MG capsule; Take 1 capsule (60 mg total) by mouth every morning.  Dispense: 90 capsule; Refill: 1

## 2019-10-19 NOTE — Patient Instructions (Signed)
Suicidal Feelings: How to Help Yourself Suicide is when you end your own life. There are many things you can do to help yourself feel better when struggling with these feelings. Many services and people are available to support you and others who struggle with similar feelings.  If you ever feel like you may hurt yourself or others, or have thoughts about taking your own life, get help right away. To get help:  Call your local emergency services (911 in the [place]  [employer]'s health and human services helpline (211 in the [place]  Go to your nearest emergency department.  Call a suicide hotline to speak with a trained counselor. The following suicide hotlines are available in the [place]: ? 1-800-273-TALK (1-800-273-8255). ? 1-800-SUICIDE (1-800-784-2433). ? 1-888-628-9454. This is a hotline for Spanish speakers. ? 1-800-799-4889. This is a hotline for TTY users. ? 1-866-4-U-TREVOR (1-866-488-7386). This is a hotline for lesbian, gay, bisexual, transgender, or questioning youth. ? For a list of hotlines in [place], visit www.suicide.org/hotlines/international/[place]-suicide-hotlines.html  Contact a crisis center or a local suicide prevention center. To find a crisis center or suicide prevention center: ? Call your local hospital, clinic, community service organization, mental health center, social service provider, or health department. Ask for help with connecting to a crisis center. ? For a list of crisis centers in the [place], visit: suicidepreventionlifeline.org ? For a list of crisis centers in [place], visit: suicideprevention.ca How to help yourself feel better   Promise yourself that you will not do anything extreme when you have suicidal feelings. Remember, there is hope. Many people have gotten through suicidal thoughts and feelings, and you can too. If you have had these feelings before, remind yourself that you can get through them again.  Let family, friends,  teachers, or counselors know how you are feeling. Try not to separate yourself from those who care about you and want to help you. Talk with someone every day, even if you do not feel sociable. Face-to-face conversation is best to help them understand your feelings.  Contact a mental health care provider and work with this person regularly.  Make a safety plan that you can follow during a crisis. Include phone numbers of suicide prevention hotlines, mental health professionals, and trusted friends and family members you can call during an emergency. Save these numbers on your phone.  If you are thinking of taking a lot of medicine, give your medicine to someone who can give it to you as prescribed. If you are on antidepressants and are concerned you will overdose, tell your health care provider so that he or she can give you safer medicines.  Try to stick to your routines. Follow a schedule every day. Make self-care a priority.  Make a list of realistic goals, and cross them off when you achieve them. Accomplishments can give you a sense of worth.  Wait until you are feeling better before doing things that you find difficult or unpleasant.  Do things that you have always enjoyed to take your mind off your feelings. Try reading a book, or listening to or playing music. Spending time outside, in nature, may help you feel better. Follow these instructions at home:   Visit your primary health care provider every year for a checkup.  Work with a mental health care provider as needed.  Eat a well-balanced diet, and eat regular meals.  Get plenty of rest.  Exercise if you are able. Just 30 minutes of exercise each day can   help you feel better.  Take over-the-counter and prescription medicines only as told by your health care provider. Ask your mental health care provider about the possible side effects of any medicines you are taking.  Do not use alcohol or drugs, and remove these substances  from your home.  Remove weapons, poisons, knives, and other deadly items from your home. General recommendations  Keep your living space well lit.  When you are feeling well, write yourself a letter with tips and support that you can read when you are not feeling well.  Remember that life's difficulties can be sorted out with help. Conditions can be treated, and you can learn behaviors and ways of thinking that will help you. Where to find more information  National Suicide Prevention Lifeline: www.suicidepreventionlifeline.org  Hopeline: www.hopeline.com  American Foundation for Suicide Prevention: www.afsp.org  The Trevor Project (for lesbian, gay, bisexual, transgender, or questioning youth): www.thetrevorproject.org Contact a health care provider if:  You feel as though you are a burden to others.  You feel agitated, angry, vengeful, or have extreme mood swings.  You have withdrawn from family and friends. Get help right away if:  You are talking about suicide or wishing to die.  You start making plans for how to commit suicide.  You feel that you have no reason to live.  You start making plans for putting your affairs in order, saying goodbye, or giving your possessions away.  You feel guilt, shame, or unbearable pain, and it seems like there is no way out.  You are frequently using drugs or alcohol.  You are engaging in risky behaviors that could lead to death. If you have any of these symptoms, get help right away. Call emergency services, go to your nearest emergency department or crisis center, or call a suicide crisis helpline. Summary  Suicide is when you take your own life.  Promise yourself that you will not do anything extreme when you have suicidal feelings.  Let family, friends, teachers, or counselors know how you are feeling.  Get help right away if you feel as though life is getting too tough to handle and you are thinking about suicide. This  information is not intended to replace advice given to you by your health care provider. Make sure you discuss any questions you have with your health care provider. Document Revised: 08/12/2018 Document Reviewed: 12/02/2016 Elsevier Patient Education  2020 [employer].  

## 2019-12-05 ENCOUNTER — Encounter: Payer: Self-pay | Admitting: Family Medicine

## 2019-12-06 ENCOUNTER — Ambulatory Visit (INDEPENDENT_AMBULATORY_CARE_PROVIDER_SITE_OTHER): Payer: 59 | Admitting: Family Medicine

## 2019-12-06 ENCOUNTER — Other Ambulatory Visit (HOSPITAL_COMMUNITY)
Admission: RE | Admit: 2019-12-06 | Discharge: 2019-12-06 | Disposition: A | Discharge status: Home / Self Care | Payer: 59 | Source: Ambulatory Visit | Attending: Family Medicine | Admitting: Family Medicine

## 2019-12-06 ENCOUNTER — Encounter: Payer: Self-pay | Admitting: Family Medicine

## 2019-12-06 ENCOUNTER — Encounter: Payer: Self-pay | Admitting: Internal Medicine

## 2019-12-06 ENCOUNTER — Encounter: Payer: 59 | Admitting: Internal Medicine

## 2019-12-06 ENCOUNTER — Other Ambulatory Visit: Payer: Self-pay

## 2019-12-06 VITALS — BP 104/78 | HR 91 | Temp 98.2°F | Resp 16 | Ht 63.0 in | Wt 173.7 lb

## 2019-12-06 VITALS — BP 108/76 | HR 104 | Temp 97.9°F | Resp 14 | Ht 63.0 in | Wt 173.0 lb

## 2019-12-06 DIAGNOSIS — N898 Other specified noninflammatory disorders of vagina: Secondary | ICD-10-CM | POA: Diagnosis not present

## 2019-12-06 LAB — POCT URINALYSIS DIPSTICK
Appearance: NORMAL
Bilirubin, UA: NEGATIVE
Blood, UA: NEGATIVE
Glucose, UA: NEGATIVE
Ketones, UA: NEGATIVE
Leukocytes, UA: NEGATIVE
Nitrite, UA: NEGATIVE
Odor: NORMAL
Protein, UA: NEGATIVE
Spec Grav, UA: 1.01 (ref 1.010–1.025)
Urobilinogen, UA: 0.2 E.U./dL
pH, UA: 7 (ref 5.0–8.0)

## 2019-12-06 MED ORDER — METRONIDAZOLE 500 MG PO TABS: 500.0000 mg | ORAL_TABLET | Freq: Two times a day (BID) | ORAL | 0 refills | Status: DC

## 2019-12-06 NOTE — Progress Notes (Signed)
Name: ELESHIA Horn   MRN: [id number]    DOB: February 05, 1991   Date:12/06/2019       Progress Note  Subjective  Chief Complaint  Chief Complaint  Patient presents with   Vaginal Discharge    HPI  Vaginal discharge: she has a history of recurrent BV, she has seen Dr. Marcelline Mates in the past and advised to try boric acid in the past, she states usually she responds to medication but this time continues to have thick , greenish vaginal discharge, with odor, no pain or itching, denies dysuria, no fever or chills. LMP was 197 days ago - she takes ocp continuously  , last intercourse was around May 2021   Patient Active Problem List   Diagnosis Date Noted   Uterine leiomyoma 03/02/2019   Overweight (BMI 25.0-29.9) 03/02/2019   Dysthymia 11/11/2018   Bilateral hand pain 12/28/2017   Right carpal tunnel syndrome 11/10/2016   Urticaria 24/40/1027   Umbilical cyst 25/36/6440   Perennial allergic rhinitis 11/16/2014   ANA positive 10/06/2014   Anxiety 10/06/2014    Social History   Tobacco Use   Smoking status: Never Smoker   Smokeless tobacco: Never Used  Substance Use Topics   Alcohol use: Yes    Alcohol/week: 1.0 standard drink    Types: 1 Standard drinks or equivalent per week    Comment: ocassional     Current Outpatient Medications:    Azelastine HCl 137 MCG/SPRAY SOLN, Place 2 sprays into the nose daily., Disp: 90 mL, Rfl: 1   DULoxetine (CYMBALTA) 60 MG capsule, Take 1 capsule (60 mg total) by mouth every morning., Disp: 90 capsule, Rfl: 1   fluticasone (FLONASE) 50 MCG/ACT nasal spray, Place 2 sprays into both nostrils daily., Disp: 48 g, Rfl: 1   hydrOXYzine (ATARAX/VISTARIL) 10 MG tablet, Take 1-2 tablets (10-20 mg total) by mouth 3 (three) times daily as needed., Disp: 90 tablet, Rfl: 0   levocetirizine (XYZAL) 5 MG tablet, Take 1 tablet (5 mg total) by mouth every evening., Disp: 90 tablet, Rfl: 0   loratadine (CLARITIN) 10 MG tablet, Take 1 tablet (10 mg  total) by mouth daily., Disp: 90 tablet, Rfl: 0   montelukast (SINGULAIR) 10 MG tablet, Take 1 tablet (10 mg total) by mouth at bedtime., Disp: 90 tablet, Rfl: 1   norethindrone-ethinyl estradiol (JUNEL FE 1/20) 1-20 MG-MCG tablet, Take 1 tablet by mouth daily., Disp: 3 Package, Rfl: 4   traZODone (DESYREL) 50 MG tablet, Take 0.5-1 tablets (25-50 mg total) by mouth at bedtime as needed. for sleep, Disp: 90 tablet, Rfl: 0  Allergies  Allergen Reactions   Peanut Oil Swelling    Eyelids swell.   Peanut-Containing Drug Products     ROS  Ten systems reviewed and is negative except as mentioned in HPI   Objective  Vitals:   12/06/19 1319  BP: 108/76  Pulse: (!) 104  Resp: 14  Temp: 97.9 F (36.6 C)  TempSrc: Temporal  SpO2: 98%  Weight: 173 lb (78.5 kg)  Height: 5\' 3"  (1.6 m)    Body mass index is 30.65 kg/m.    Physical Exam  Constitutional: Patient appears well-developed and well-nourished. Obese  No distress.  HEENT: head atraumatic, normocephalic, pupils equal and reactive to light,  neck supple Cardiovascular: Normal rate, regular rhythm and normal heart sounds.  No murmur heard. No BLE edema. Pulmonary/Chest: Effort normal and breath sounds normal. No respiratory distress. Abdominal: Soft.  There is no tenderness. Psychiatric: Patient has a normal  mood and affect. behavior is normal. Judgment and thought content normal.  Recent Results (from the past 2160 hour(s))  POCT Urinalysis Dipstick     Status: Normal   Collection Time: 12/06/19 10:13 AM  Result Value Ref Range   Color, UA Yellow    Clarity, UA Clear    Glucose, UA Negative Negative   Bilirubin, UA Negative    Ketones, UA Negative    Spec Grav, UA 1.010 1.010 - 1.025   Blood, UA Negative    pH, UA 7.0 5.0 - 8.0   Protein, UA Negative Negative   Urobilinogen, UA 0.2 0.2 or 1.0 E.U./dL   Nitrite, UA Negative    Leukocytes, UA Negative Negative   Appearance Normal    Odor Normal       Assessment & Plan    1. Vaginal discharge  - Cervicovaginal ancillary only - metroNIDAZOLE (FLAGYL) 500 MG tablet; Take 1 tablet (500 mg total) by mouth 2 (two) times daily.  Dispense: 14 tablet; Refill: 0  Discussed importance of condom use every time. She was in the same relationship since 2020.

## 2019-12-06 NOTE — Addendum Note (Signed)
Addended by: Lennie Muckle on: 12/06/2019 10:33 AM   Modules accepted: Orders

## 2019-12-06 NOTE — Progress Notes (Signed)
Patient ID: Patricia Horn, female    DOB: [birth year], 29 y.o.   MRN: [id number]  PCP: Steele Sizer, MD  Chief Complaint  Patient presents with  . Dark urine    noticed urine was dark throughout the day, she drinks water all day  . Vaginal Discharge    started about a month ago, used boric acid, no resolve    Subjective:   Patricia Horn is a 29 y.o. female, presents to clinic with CC of the following:  Chief Complaint  Patient presents with  . Dark urine    noticed urine was dark throughout the day, she drinks water all day  . Vaginal Discharge    started about a month ago, used boric acid, no resolve    HPI:  Patient is a 29 year old female patient of Dr. Ancil Boozer Presents today above complaints. Message was noted from yesterday as follows:  Good morning Dr. Ancil Boozer,  I am experiencing some vaginal discharge. It's been about a month now. I've used boric acid capsules to see if it would cure it but it didn't. I'm guessing I need something else. I have a history of BV and yeast infections. The discharge is more of a green color.  Good morning Dr. Ancil Boozer,  I am experiencing some vaginal discharge. It's been about a month now. I've used boric acid capsules to see if it would cure it but it didn't. I'm guessing I need something else. I have a history of BV and yeast infections. The discharge is more of a green color.   She denies any fevers, flank pains, no burning with urination  Urine dip in the office today was entirely negative.     Patient Active Problem List   Diagnosis Date Noted  . Uterine leiomyoma 03/02/2019  . Overweight (BMI 25.0-29.9) 03/02/2019  . Dysthymia 11/11/2018  . Bilateral hand pain 12/28/2017  . Right carpal tunnel syndrome 11/10/2016  . Urticaria 11/09/2015  . Umbilical cyst 62/56/3893  . Perennial allergic rhinitis 11/16/2014  . ANA positive 10/06/2014  . Anxiety 10/06/2014      Current Outpatient Medications:  .  Azelastine HCl 137  MCG/SPRAY SOLN, Place 2 sprays into the nose daily., Disp: 90 mL, Rfl: 1 .  DULoxetine (CYMBALTA) 60 MG capsule, Take 1 capsule (60 mg total) by mouth every morning., Disp: 90 capsule, Rfl: 1 .  fluticasone (FLONASE) 50 MCG/ACT nasal spray, Place 2 sprays into both nostrils daily., Disp: 48 g, Rfl: 1 .  hydrOXYzine (ATARAX/VISTARIL) 10 MG tablet, Take 1-2 tablets (10-20 mg total) by mouth 3 (three) times daily as needed., Disp: 90 tablet, Rfl: 0 .  levocetirizine (XYZAL) 5 MG tablet, Take 1 tablet (5 mg total) by mouth every evening., Disp: 90 tablet, Rfl: 0 .  loratadine (CLARITIN) 10 MG tablet, Take 1 tablet (10 mg total) by mouth daily., Disp: 90 tablet, Rfl: 0 .  montelukast (SINGULAIR) 10 MG tablet, Take 1 tablet (10 mg total) by mouth at bedtime., Disp: 90 tablet, Rfl: 1 .  norethindrone-ethinyl estradiol (JUNEL FE 1/20) 1-20 MG-MCG tablet, Take 1 tablet by mouth daily., Disp: 3 Package, Rfl: 4 .  traZODone (DESYREL) 50 MG tablet, Take 0.5-1 tablets (25-50 mg total) by mouth at bedtime as needed. for sleep, Disp: 90 tablet, Rfl: 0   Allergies  Allergen Reactions  . Peanut Oil Swelling    Eyelids swell.  . Peanut-Containing Drug Products      Past Surgical History:  Procedure Laterality Date  .  HERNIA REPAIR  3710   umbilical   . MYOMECTOMY  2015     Family History  Problem Relation Age of Onset  . Hypertension Mother   . Scleroderma Mother   . Pulmonary fibrosis Mother   . Asthma Mother   . Cancer Neg Hx      Social History   Tobacco Use  . Smoking status: Never Smoker  . Smokeless tobacco: Never Used  Substance Use Topics  . Alcohol use: Yes    Alcohol/week: 1.0 standard drink    Types: 1 Standard drinks or equivalent per week    Comment: ocassional    With staff assistance, above reviewed with the patient today.  ROS: As per HPI, otherwise no specific complaints on a limited and focused system review   Results for orders placed or performed in visit on  12/06/19 (from the past 72 hour(s))  POCT Urinalysis Dipstick     Status: Normal   Collection Time: 12/06/19 10:13 AM  Result Value Ref Range   Color, UA Yellow    Clarity, UA Clear    Glucose, UA Negative Negative   Bilirubin, UA Negative    Ketones, UA Negative    Spec Grav, UA 1.010 1.010 - 1.025   Blood, UA Negative    pH, UA 7.0 5.0 - 8.0   Protein, UA Negative Negative   Urobilinogen, UA 0.2 0.2 or 1.0 E.U./dL   Nitrite, UA Negative    Leukocytes, UA Negative Negative   Appearance Normal    Odor Normal      PHQ2/9: Depression screen [facility] 2/9 12/06/2019 10/19/2019 07/12/2019 04/21/2019 03/14/2019  Decreased Interest 0 0 0 0 0  Down, Depressed, Hopeless 0 0 0 0 0  PHQ - 2 Score 0 0 0 0 0  Altered sleeping 0 0 3 0 3  Tired, decreased energy 0 0 3 0 2  Change in appetite 0 0 0 0 0  Feeling bad or failure about yourself  0 0 0 0 0  Trouble concentrating 0 0 0 0 0  Moving slowly or fidgety/restless 0 0 0 0 0  Suicidal thoughts 0 1 0 0 0  PHQ-9 Score 0 1 6 0 5  Difficult doing work/chores Not difficult at all Not difficult at all Not difficult at all Not difficult at all Not difficult at all  Some recent data might be hidden   PHQ-2/9 Result is neg  Fall Risk: Fall Risk  12/06/2019 10/19/2019 07/12/2019 04/21/2019 03/14/2019  Falls in the past year? 1 1 0 0 0  Number falls in past yr: 0 0 0 0 0  Injury with Fall? 0 1 0 0 0  Follow up - - - - -      Objective:   Vitals:   12/06/19 1000  BP: 104/78  Pulse: 91  Resp: 16  Temp: 98.2 F (36.8 C)  TempSrc: Temporal  SpO2: 99%  Weight: 173 lb 11.2 oz (78.8 kg)  Height: 5\' 3"  (1.6 m)    Body mass index is 30.77 kg/m.  Physical Exam   NAD, masked, very pleasant HEENT - [place]/AT, sclera anicteric, Back-no CVA tenderness Neuro/psychiatric - affect was not flat, appropriate with conversation  Alert with speech normal Results for orders placed or performed in visit on 12/06/19  POCT Urinalysis Dipstick  Result Value Ref  Range   Color, UA Yellow    Clarity, UA Clear    Glucose, UA Negative Negative   Bilirubin, UA Negative    Ketones,  UA Negative    Spec Grav, UA 1.010 1.010 - 1.025   Blood, UA Negative    pH, UA 7.0 5.0 - 8.0   Protein, UA Negative Negative   Urobilinogen, UA 0.2 0.2 or 1.0 E.U./dL   Nitrite, UA Negative    Leukocytes, UA Negative Negative   Appearance Normal    Odor Normal        Assessment & Plan:   Vaginal discharge with dark urine, noted has been about a month now with symptoms.  No concerns for a UTI with a completely negative urine dip today noted.  No dysuria. Do feel further assessment is needed, and my colleagues here that are female help with woman's health needs and examination/evaluation. We will ask them to help with this, as the patient noted she has a gynecologist that she sees although it is often very hard for her to get in with them, as that was noted as another option as well.  Do not feel assessing a charge for this visit is appropriate, as given the complaint, scheduling with a female colleague of mine who does these assessments/evaluations was needed and more appropriate.       Towanda Malkin, MD 12/06/19 10:15 AM

## 2019-12-08 LAB — CERVICOVAGINAL ANCILLARY ONLY
Bacterial Vaginitis (gardnerella): POSITIVE — AB
Candida Glabrata: NEGATIVE
Candida Vaginitis: NEGATIVE
Chlamydia: NEGATIVE
Comment: NEGATIVE
Comment: NEGATIVE
Comment: NEGATIVE
Comment: NEGATIVE
Comment: NEGATIVE
Comment: NORMAL
Neisseria Gonorrhea: NEGATIVE
Trichomonas: NEGATIVE

## 2020-02-07 ENCOUNTER — Other Ambulatory Visit: Payer: Self-pay | Admitting: Family Medicine

## 2020-02-07 DIAGNOSIS — J3089 Other allergic rhinitis: Secondary | ICD-10-CM

## 2020-03-01 NOTE — Progress Notes (Addendum)
Pt present for annual exam. Pt stated that she was having itching in the vaginal area and unsure if she was having a flare up of BV no other issues. Pt up to date on pap smear and covid vaccine. Pt declined flu vaccine at this time will get at a later date.

## 2020-03-01 NOTE — Patient Instructions (Addendum)
Preventive Care 21-31 Years Old, Female Preventive care refers to visits with your health care provider and lifestyle choices that can promote health and wellness. This includes:  A yearly physical exam. This may also be called an annual well check.  Regular dental visits and eye exams.  Immunizations.  Screening for certain conditions.  Healthy lifestyle choices, such as eating a healthy diet, getting regular exercise, not using drugs or products that contain nicotine and tobacco, and limiting alcohol use. What can I expect for my preventive care visit? Physical exam Your health care provider will check your:  Height and weight. This may be used to calculate body mass index (BMI), which tells if you are at a healthy weight.  Heart rate and blood pressure.  Skin for abnormal spots. Counseling Your health care provider may ask you questions about your:  Alcohol, tobacco, and drug use.  Emotional well-being.  Home and relationship well-being.  Sexual activity.  Eating habits.  Work and work environment.  Method of birth control.  Menstrual cycle.  Pregnancy history. What immunizations do I need?  Influenza (flu) vaccine  This is recommended every year. Tetanus, diphtheria, and pertussis (Tdap) vaccine  You may need a Td booster every 10 years. Varicella (chickenpox) vaccine  You may need this if you have not been vaccinated. Human papillomavirus (HPV) vaccine  If recommended by your health care provider, you may need three doses over 6 months. Measles, mumps, and rubella (MMR) vaccine  You may need at least one dose of MMR. You may also need a second dose. Meningococcal conjugate (MenACWY) vaccine  One dose is recommended if you are age 19-21 years and a first-year college student living in a residence hall, or if you have one of several medical conditions. You may also need additional booster doses. Pneumococcal conjugate (PCV13) vaccine  You may need  this if you have certain conditions and were not previously vaccinated. Pneumococcal polysaccharide (PPSV23) vaccine  You may need one or two doses if you smoke cigarettes or if you have certain conditions. Hepatitis A vaccine  You may need this if you have certain conditions or if you travel or work in places where you may be exposed to hepatitis A. Hepatitis B vaccine  You may need this if you have certain conditions or if you travel or work in places where you may be exposed to hepatitis B. Haemophilus influenzae type b (Hib) vaccine  You may need this if you have certain conditions. You may receive vaccines as individual doses or as more than one vaccine together in one shot (combination vaccines). Talk with your health care provider about the risks and benefits of combination vaccines. What tests do I need?  Blood tests  Lipid and cholesterol levels. These may be checked every 5 years starting at age 20.  Hepatitis C test.  Hepatitis B test. Screening  Diabetes screening. This is done by checking your blood sugar (glucose) after you have not eaten for a while (fasting).  Sexually transmitted disease (STD) testing.  BRCA-related cancer screening. This may be done if you have a family history of breast, ovarian, tubal, or peritoneal cancers.  Pelvic exam and Pap test. This may be done every 3 years starting at age 21. Starting at age 30, this may be done every 5 years if you have a Pap test in combination with an HPV test. Talk with your health care provider about your test results, treatment options, and if necessary, the need for more tests.   Follow these instructions at home: Eating and drinking   Eat a diet that includes fresh fruits and vegetables, whole grains, lean protein, and low-fat dairy.  Take vitamin and mineral supplements as recommended by your health care provider.  Do not drink alcohol if: ? Your health care provider tells you not to drink. ? You are  pregnant, may be pregnant, or are planning to become pregnant.  If you drink alcohol: ? Limit how much you have to 0-1 drink a day. ? Be aware of how much alcohol is in your drink. In the [place] one drink equals one 12 oz bottle of beer (355 mL), one 5 oz glass of wine (148 mL), or one 1 oz glass of hard liquor (44 mL). Lifestyle  Take daily care of your teeth and gums.  Stay active. Exercise for at least 30 minutes on 5 or more days each week.  Do not use any products that contain nicotine or tobacco, such as cigarettes, e-cigarettes, and chewing tobacco. If you need help quitting, ask your health care provider.  If you are sexually active, practice safe sex. Use a condom or other form of birth control (contraception) in order to prevent pregnancy and STIs (sexually transmitted infections). If you plan to become pregnant, see your health care provider for a preconception visit. What's next?  Visit your health care provider once a year for a well check visit.  Ask your health care provider how often you should have your eyes and teeth checked.  Stay up to date on all vaccines. This information is not intended to replace advice given to you by your health care provider. Make sure you discuss any questions you have with your health care provider. Document Revised: 12/31/2017 Document Reviewed: 12/31/2017 Elsevier Patient Education  2020 [employer] Breast Self-Awareness Breast self-awareness is knowing how your breasts look and feel. Doing breast self-awareness is important. It allows you to catch a breast problem early while it is still small and can be treated. All women should do breast self-awareness, including women who have had breast implants. Tell your doctor if you notice a change in your breasts. What you need:  A mirror.  A well-lit room. How to do a breast self-exam A breast self-exam is one way to learn what is normal for your breasts and to check for changes. To do a  breast self-exam: Look for changes  1. Take off all the clothes above your waist. 2. Stand in front of a mirror in a room with good lighting. 3. Put your hands on your hips. 4. Push your hands down. 5. Look at your breasts and nipples in the mirror to see if one breast or nipple looks different from the other. Check to see if: ? The shape of one breast is different. ? The size of one breast is different. ? There are wrinkles, dips, and bumps in one breast and not the other. 6. Look at each breast for changes in the skin, such as: ? Redness. ? Scaly areas. 7. Look for changes in your nipples, such as: ? Liquid around the nipples. ? Bleeding. ? Dimpling. ? Redness. ? A change in where the nipples are. Feel for changes  1. Lie on your back on the floor. 2. Feel each breast. To do this, follow these steps: ? Pick a breast to feel. ? Put the arm closest to that breast above your head. ? Use your other arm to feel the nipple area of your breast. Feel   the area with the pads of your three middle fingers by making small circles with your fingers. For the first circle, press lightly. For the second circle, press harder. For the third circle, press even harder. ? Keep making circles with your fingers at the different pressures as you move down your breast. Stop when you feel your ribs. ? Move your fingers a little toward the center of your body. ? Start making circles with your fingers again, this time going up until you reach your collarbone. ? Keep making up-and-down circles until you reach your armpit. Remember to keep using the three pressures. ? Feel the other breast in the same way. 3. Sit or stand in the tub or shower. 4. With soapy water on your skin, feel each breast the same way you did in step 2 when you were lying on the floor. Write down what you find Writing down what you find can help you remember what to tell your doctor. Write down:  What is normal for each breast.  Any  changes you find in each breast, including: ? The kind of changes you find. ? Whether you have pain. ? Size and location of any lumps.  When you last had your menstrual period. General tips  Check your breasts every month.  If you are breastfeeding, the best time to check your breasts is after you feed your baby or after you use a breast pump.  If you get menstrual periods, the best time to check your breasts is 5-7 days after your menstrual period is over.  With time, you will become comfortable with the self-exam, and you will begin to know if there are changes in your breasts. Contact a doctor if you:  See a change in the shape or size of your breasts or nipples.  See a change in the skin of your breast or nipples, such as red or scaly skin.  Have fluid coming from your nipples that is not normal.  Find a lump or thick area that was not there before.  Have pain in your breasts.  Have any concerns about your breast health. Summary  Breast self-awareness includes looking for changes in your breasts, as well as feeling for changes within your breasts.  Breast self-awareness should be done in front of a mirror in a well-lit room.  You should check your breasts every month. If you get menstrual periods, the best time to check your breasts is 5-7 days after your menstrual period is over.  Let your doctor know of any changes you see in your breasts, including changes in size, changes on the skin, pain or tenderness, or fluid from your nipples that is not normal. This information is not intended to replace advice given to you by your health care provider. Make sure you discuss any questions you have with your health care provider. Document Revised: 12/08/2017 Document Reviewed: 12/08/2017 Elsevier Patient Education  [place] 86-58 Years Old, Female Preventive care refers to visits with your health care provider and lifestyle choices that can promote  health and wellness. This includes:  A yearly physical exam. This may also be called an annual well check.  Regular dental visits and eye exams.  Immunizations.  Screening for certain conditions.  Healthy lifestyle choices, such as eating a healthy diet, getting regular exercise, not using drugs or products that contain nicotine and tobacco, and limiting alcohol use. What can I expect for my preventive care visit? Physical exam Your  health care provider will check your:  Height and weight. This may be used to calculate body mass index (BMI), which tells if you are at a healthy weight.  Heart rate and blood pressure.  Skin for abnormal spots. Counseling Your health care provider may ask you questions about your:  Alcohol, tobacco, and drug use.  Emotional well-being.  Home and relationship well-being.  Sexual activity.  Eating habits.  Work and work Statistician.  Method of birth control.  Menstrual cycle.  Pregnancy history. What immunizations do I need?  Influenza (flu) vaccine  This is recommended every year. Tetanus, diphtheria, and pertussis (Tdap) vaccine  You may need a Td booster every 10 years. Varicella (chickenpox) vaccine  You may need this if you have not been vaccinated. Human papillomavirus (HPV) vaccine  If recommended by your health care provider, you may need three doses over 6 months. Measles, mumps, and rubella (MMR) vaccine  You may need at least one dose of MMR. You may also need a second dose. Meningococcal conjugate (MenACWY) vaccine  One dose is recommended if you are age 80-21 years and a first-year college student living in a residence hall, or if you have one of several medical conditions. You may also need additional booster doses. Pneumococcal conjugate (PCV13) vaccine  You may need this if you have certain conditions and were not previously vaccinated. Pneumococcal polysaccharide (PPSV23) vaccine  You may need one or two  doses if you smoke cigarettes or if you have certain conditions. Hepatitis A vaccine  You may need this if you have certain conditions or if you travel or work in places where you may be exposed to hepatitis A. Hepatitis B vaccine  You may need this if you have certain conditions or if you travel or work in places where you may be exposed to hepatitis B. Haemophilus influenzae type b (Hib) vaccine  You may need this if you have certain conditions. You may receive vaccines as individual doses or as more than one vaccine together in one shot (combination vaccines). Talk with your health care provider about the risks and benefits of combination vaccines. What tests do I need?  Blood tests  Lipid and cholesterol levels. These may be checked every 5 years starting at age 53.  Hepatitis C test.  Hepatitis B test. Screening  Diabetes screening. This is done by checking your blood sugar (glucose) after you have not eaten for a while (fasting).  Sexually transmitted disease (STD) testing.  BRCA-related cancer screening. This may be done if you have a family history of breast, ovarian, tubal, or peritoneal cancers.  Pelvic exam and Pap test. This may be done every 3 years starting at age 57. Starting at age 52, this may be done every 5 years if you have a Pap test in combination with an HPV test. Talk with your health care provider about your test results, treatment options, and if necessary, the need for more tests. Follow these instructions at home: Eating and drinking   Eat a diet that includes fresh fruits and vegetables, whole grains, lean protein, and low-fat dairy.  Take vitamin and mineral supplements as recommended by your health care provider.  Do not drink alcohol if: ? Your health care provider tells you not to drink. ? You are pregnant, may be pregnant, or are planning to become pregnant.  If you drink alcohol: ? Limit how much you have to 0-1 drink a day. ? Be aware of  how much alcohol is in  your drink. In the [place] one drink equals one 12 oz bottle of beer (355 mL), one 5 oz glass of wine (148 mL), or one 1 oz glass of hard liquor (44 mL). Lifestyle  Take daily care of your teeth and gums.  Stay active. Exercise for at least 30 minutes on 5 or more days each week.  Do not use any products that contain nicotine or tobacco, such as cigarettes, e-cigarettes, and chewing tobacco. If you need help quitting, ask your health care provider.  If you are sexually active, practice safe sex. Use a condom or other form of birth control (contraception) in order to prevent pregnancy and STIs (sexually transmitted infections). If you plan to become pregnant, see your health care provider for a preconception visit. What's next?  Visit your health care provider once a year for a well check visit.  Ask your health care provider how often you should have your eyes and teeth checked.  Stay up to date on all vaccines. This information is not intended to replace advice given to you by your health care provider. Make sure you discuss any questions you have with your health care provider. Document Revised: 12/31/2017 Document Reviewed: 12/31/2017 Elsevier Patient Education  2020 [employer]     Breast Self-Awareness Breast self-awareness means being familiar with how your breasts look and feel. It involves checking your breasts regularly and reporting any changes to your health care provider. Practicing breast self-awareness is important. Sometimes changes may not be harmful (are benign), but sometimes a change in your breasts can be a sign of a serious medical problem. It is important to learn how to do this procedure correctly so that you can catch problems early, when treatment is more likely to be successful. All women should practice breast self-awareness, including women who have had breast implants. What you need:  A mirror.  A well-lit room. How to do a breast  self-exam A breast self-exam is one way to learn what is normal for your breasts and whether your breasts are changing. To do a breast self-exam: Look for changes  1. Remove all the clothing above your waist. 2. Stand in front of a mirror in a room with good lighting. 3. Put your hands on your hips. 4. Push your hands firmly downward. 5. Compare your breasts in the mirror. Look for differences between them (asymmetry), such as: ? Differences in shape. ? Differences in size. ? Puckers, dips, and bumps in one breast and not the other. 6. Look at each breast for changes in the skin, such as: ? Redness. ? Scaly areas. 7. Look for changes in your nipples, such as: ? Discharge. ? Bleeding. ? Dimpling. ? Redness. ? A change in position. Feel for changes Carefully feel your breasts for lumps and changes. It is best to do this while lying on your back on the floor, and again while sitting or standing in the tub or shower with soapy water on your skin. Feel each breast in the following way: 1. Place the arm on the side of the breast you are examining above your head. 2. Feel your breast with the other hand. 3. Start in the nipple area and make -inch (2 cm) overlapping circles to feel your breast. Use the pads of your three middle fingers to do this. Apply light pressure, then medium pressure, then firm pressure. The light pressure will allow you to feel the tissue closest to the skin. The medium pressure will allow you to   feel the tissue that is a little deeper. The firm pressure will allow you to feel the tissue close to the ribs. 4. Continue the overlapping circles, moving downward over the breast until you feel your ribs below your breast. 5. Move one finger-width toward the center of the body. Continue to use the -inch (2 cm) overlapping circles to feel your breast as you move slowly up toward your collarbone. 6. Continue the up-and-down exam using all three pressures until you reach your  armpit.  Write down what you find Writing down what you find can help you remember what to discuss with your health care provider. Write down:  What is normal for each breast.  Any changes that you find in each breast, including: ? The kind of changes you find. ? Any pain or tenderness. ? Size and location of any lumps.  Where you are in your menstrual cycle, if you are still menstruating. General tips and recommendations  Examine your breasts every month.  If you are breastfeeding, the best time to examine your breasts is after a feeding or after using a breast pump.  If you menstruate, the best time to examine your breasts is 5-7 days after your period. Breasts are generally lumpier during menstrual periods, and it may be more difficult to notice changes.  With time and practice, you will become more familiar with the variations in your breasts and more comfortable with the exam. Contact a health care provider if you:  See a change in the shape or size of your breasts or nipples.  See a change in the skin of your breast or nipples, such as a reddened or scaly area.  Have unusual discharge from your nipples.  Find a lump or thick area that was not there before.  Have pain in your breasts.  Have any concerns related to your breast health. Summary  Breast self-awareness includes looking for physical changes in your breasts, as well as feeling for any changes within your breasts.  Breast self-awareness should be performed in front of a mirror in a well-lit room.  You should examine your breasts every month. If you menstruate, the best time to examine your breasts is 5-7 days after your menstrual period.  Let your health care provider know of any changes you notice in your breasts, including changes in size, changes on the skin, pain or tenderness, or unusual fluid from your nipples. This information is not intended to replace advice given to you by your health care provider.  Make sure you discuss any questions you have with your health care provider. Document Revised: 12/08/2017 Document Reviewed: 12/08/2017 Elsevier Patient Education  2020 [employer]   

## 2020-03-02 ENCOUNTER — Encounter: Payer: Self-pay | Admitting: Obstetrics and Gynecology

## 2020-03-02 ENCOUNTER — Other Ambulatory Visit: Payer: Self-pay

## 2020-03-02 ENCOUNTER — Ambulatory Visit (INDEPENDENT_AMBULATORY_CARE_PROVIDER_SITE_OTHER): Payer: 59 | Admitting: Obstetrics and Gynecology

## 2020-03-02 VITALS — BP 107/77 | HR 90 | Ht 63.0 in | Wt 172.8 lb

## 2020-03-02 DIAGNOSIS — F419 Anxiety disorder, unspecified: Secondary | ICD-10-CM

## 2020-03-02 DIAGNOSIS — N760 Acute vaginitis: Secondary | ICD-10-CM

## 2020-03-02 DIAGNOSIS — Z01419 Encounter for gynecological examination (general) (routine) without abnormal findings: Secondary | ICD-10-CM | POA: Diagnosis not present

## 2020-03-02 DIAGNOSIS — Z3041 Encounter for surveillance of contraceptive pills: Secondary | ICD-10-CM | POA: Diagnosis not present

## 2020-03-02 DIAGNOSIS — F32A Depression, unspecified: Secondary | ICD-10-CM

## 2020-03-02 DIAGNOSIS — E669 Obesity, unspecified: Secondary | ICD-10-CM

## 2020-03-02 DIAGNOSIS — B9689 Other specified bacterial agents as the cause of diseases classified elsewhere: Secondary | ICD-10-CM

## 2020-03-02 MED ORDER — SOLOSEC 2 G PO PACK: 1.0000 | PACK | Freq: Once | ORAL | 1 refills | Status: AC

## 2020-03-02 MED ORDER — NORETHIN ACE-ETH ESTRAD-FE 1-20 MG-MCG PO TABS: 1.0000 | ORAL_TABLET | Freq: Every day | ORAL | 4 refills | Status: DC

## 2020-03-02 NOTE — Progress Notes (Addendum)
ANNUAL PREVENTATIVE CARE GYN  ENCOUNTER NOTE  Subjective:       Patricia Horn is a 29 y.o. G0P0000 female here for a routine annual gynecologic exam. Patient is currently sexually active. She reports that she engages in exercise- weight lifting for 30 minutes 3 days/week. Patient does have tattoos (professionaly performed).     Current complaints: 1. Thick light green discharge- denies odor, abdominal pain or burning.  She has a history of recurrent vaginitis (typically bacterial vaginosis) in the past.   Gynecologic History No LMP recorded (lmp unknown). (Menstrual status: Oral contraceptives). Contraception: Mirena OCP (estrogen/progesterone)  Last Pap: 02/03/2018. Results were: normal    Upstream - 03/02/20 0936      Pregnancy Intention Screening   Does the patient want to become pregnant in the next year? No    Does the patient's partner want to become pregnant in the next year? N/A    Would the patient like to discuss contraceptive options today? No      Contraception Wrap Up   Current Method Oral Contraceptive            Upstream - 03/02/20 0936      Pregnancy Intention Screening   Does the patient want to become pregnant in the next year? No    Does the patient's partner want to become pregnant in the next year? N/A    Would the patient like to discuss contraceptive options today? No      Contraception Wrap Up   Current Method Oral Contraceptive          The pregnancy intention screening data noted above was reviewed. Potential methods of contraception were not discussed. The patient elected to continue with Oral Contraceptive.     Obstetric History OB History  Gravida Para Term Preterm AB Living  0 0 0 0 0 0  SAB TAB Ectopic Multiple Live Births  0 0 0 0    Obstetric Comments  1st Menstrual Cycle:  8    Past Medical History:  Diagnosis Date  . Anxiety   . Depression   . Environmental allergies   . Fibroid uterus   . Tendinitis   . Umbilical  hernia   . Uterine leiomyoma 03/02/2019    Past Surgical History:  Procedure Laterality Date  . HERNIA REPAIR  1610   umbilical   . MYOMECTOMY  2015    Current Outpatient Medications on File Prior to Visit  Medication Sig Dispense Refill  . DULoxetine (CYMBALTA) 60 MG capsule Take 1 capsule (60 mg total) by mouth every morning. 90 capsule 1  . hydrOXYzine (ATARAX/VISTARIL) 10 MG tablet Take 1-2 tablets (10-20 mg total) by mouth 3 (three) times daily as needed. 90 tablet 0  . levocetirizine (XYZAL) 5 MG tablet Take 1 tablet (5 mg total) by mouth every evening. 90 tablet 0  . norethindrone-ethinyl estradiol (JUNEL FE 1/20) 1-20 MG-MCG tablet Take 1 tablet by mouth daily. 3 Package 4  . traZODone (DESYREL) 50 MG tablet Take 0.5-1 tablets (25-50 mg total) by mouth at bedtime as needed. for sleep 90 tablet 0   No current facility-administered medications on file prior to visit.    Allergies  Allergen Reactions  . Peanut Oil Swelling    Eyelids swell.  . Peanut-Containing Drug Products     Social History   Socioeconomic History  . Marital status: Single    Spouse name: Not on file  . Number of children: 0  . Years of education: Not  on file  . Highest education level: Master's degree (e.g., MA, MS, MEng, MEd, MSW, MBA)  Occupational History  . 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 Smoking status: Never Smoker  . Smokeless tobacco: Never Used  Vaping Use  . Vaping Use: Never used  Substance and Sexual Activity  . Alcohol use: Yes    Alcohol/week: 1.0 standard drink    Types: 1 Standard drinks or equivalent per week    Comment: ocassional  . Drug use: No  . Sexual activity: Not Currently    Partners: Male    Birth control/protection: Pill  Other Topics Concern  . Not on file  Social History Narrative   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Facilities manager , Energy manager   Social Determinants of Adult nurse Strain:   . Difficulty of Paying Living Expenses: Not on file  Food Insecurity: No Food Insecurity  . Worried About Charity fundraiser in the Last Year: Never true  . Ran Out of Food in the Last Year: Never true  Transportation Needs: No Transportation Needs  . Lack of Transportation (Medical): No  . Lack of Transportation (Non-Medical): No  Physical Activity: Insufficiently Active  . Days of Exercise per Week: 7 days  . Minutes of Exercise per Session: 20 min  Stress: Stress Concern Present  . Feeling of Stress : Very much  Social Connections: Moderately Integrated  . Frequency of Communication with Friends and Family: More than three times a week  . Frequency of Social Gatherings with Friends and Family: More than three times a week  . Attends Religious Services: More than 4 times per year  . Active Member of Clubs or Organizations: Yes  . Attends Archivist Meetings: More than 4 times per year  . Marital Status: Never married  Intimate Partner Violence:   . Fear of Current or Ex-Partner: Not on file  . Emotionally Abused: Not on file  . Physically Abused: Not on file  . Sexually Abused: Not on file    Family History  Problem Relation Age of Onset  . Hypertension Mother   . Scleroderma Mother   . Pulmonary fibrosis Mother   . Asthma Mother   . Cancer Neg Hx      Review of Systems Constitutional: negative for chills, fatigue, fevers and sweats Eyes: negative for irritation, redness and visual disturbance Ears, nose, mouth, throat, and face: negative for hearing loss, nasal congestion, snoring and tinnitus Respiratory: negative for asthma, cough, sputum Cardiovascular: negative for chest pain, dyspnea, exertional chest pressure/discomfort, irregular heart beat, palpitations and syncope Gastrointestinal: negative for abdominal pain, change in bowel habits, nausea and vomiting Genitourinary: negative for abnormal menstrual periods, genital  lesions, sexual problems, dysuria and urinary incontinence.  Positive for vaginal discharge (see HPI).  Integument/breast: negative for breast lump, breast tenderness and nipple discharge Hematologic/lymphatic: negative for bleeding and easy bruising Musculoskeletal:negative for back pain and muscle weakness Neurological: negative for dizziness, headaches, vertigo and weakness Endocrine: negative for diabetic symptoms including polydipsia, polyuria and skin dryness Allergic/Immunologic: negative for hay fever and urticaria       Objective:   BP 107/77   Pulse 90   Ht 5\' 3"  (1.6 m)   Wt 172 lb 12.8 oz (78.4 kg)   LMP  (LMP Unknown)   BMI 30.61 kg/m  CONSTITUTIONAL: Well-developed, well-nourished female in no acute distress.  PSYCHIATRIC:  Normal mood and affect. Normal behavior. Normal judgment and thought content. [place]: Alert and oriented to person, place, and time. Normal muscle tone coordination. No cranial nerve deficit noted. HENT:  Normocephalic, atraumatic, External right and left ear normal. EYES: Conjunctivae and EOM are normal. . No scleral icterus.  NECK: Normal range of motion, supple, no masses.  Normal thyroid.  SKIN: Skin is warm and dry. No rash noted. Not diaphoretic. No erythema. No pallor. CARDIOVASCULAR: Normal heart rate noted, regular rhythm, no murmur. RESPIRATORY: Clear to auscultation bilaterally. Effort and breath sounds normal, no problems with respiration noted. BREASTS: Symmetric in size. No masses, skin changes, nipple drainage, or lymphadenopathy. ABDOMEN: Soft, normal bowel sounds, no distention noted.  No tenderness, rebound or guarding.  BLADDER: Normal PELVIC:  External Genitalia: Normal without rash or ulcer  BUS: Normal  Vagina: Normal, no lesions; moderate amount of thin white discharge in vaginal vault  Cervix: Norma, no lesions or cervical motion tenderness  Uterus: Normal; midplane, normal size and shape, mobile, nontender  Adnexa:  Normal; nonpalpable and nontender  RV: External Exam NormaI  MUSCULOSKELETAL: Normal range of motion. No tenderness.  No cyanosis, clubbing, or edema.  2+ distal pulses. LYMPHATIC: No Axillary, Supraclavicular, or Inguinal Adenopathy.   Labs:  Microscopic wet-mount exam shows moderate clue cells, no hyphae, no trichomonads, few white blood cells. KOH done.     Lab Results  Component Value Date   WBC 8.6 06/15/2019   HGB 13.0 06/15/2019   HCT 38.3 06/15/2019   MCV 93.9 06/15/2019   PLT 380 06/15/2019      Chemistry      Component Value Date/Time   NA 138 06/15/2019 1419   NA 141 03/02/2019 1028   K 3.9 06/15/2019 1419   CL 104 06/15/2019 1419   CO2 27 06/15/2019 1419   BUN 13 06/15/2019 1419   BUN 16 03/02/2019 1028   CREATININE 0.84 06/15/2019 1419      Component Value Date/Time   CALCIUM 9.2 06/15/2019 1419   ALKPHOS 76 03/02/2019 1028   AST 17 06/15/2019 1419   ALT 15 06/15/2019 1419   BILITOT 0.2 06/15/2019 1419   BILITOT 0.2 03/02/2019 1028      Lab Results  Component Value Date   TSH 2.01 06/15/2019     Assessment:   Annual gynecologic examination 29 y.o. Contraception: OCP (estrogen/progesterone)  Obesity (Class I) Bacterial vaginosis Flu vaccine need  Plan:  Pap: pap smear up to date.  Labs: up to date Routine preventative health maintenance measures emphasized: Exercise/Diet/Weight control, Tobacco Warnings, Alcohol/Substance use risks, Stress Management and Safe Sex Contraception:combinied OCPs.  Desires refill on medication.  BV infection, with history of of recurrence. Will treat with Solosec (haas been treated with Flagyl in the past).  H/o anxiety and depression, controlled on meds. Managed by PCP  Flu vaccine - desires to postpone flu vaccine.   COVID vaccine: Up to date [employer] completed. To encourage booster vaccination when available.  Return to [place]    I have seen and examined the patient with Thornell Mule, [place]  PA-S.  I have reviewed the record and concur with patient management and plan.   Rubie Maid, MD [facility]

## 2020-03-12 ENCOUNTER — Other Ambulatory Visit: Payer: Self-pay

## 2020-03-12 MED ORDER — METRONIDAZOLE 500 MG PO TABS
500.0000 mg | ORAL_TABLET | Freq: Two times a day (BID) | ORAL | 0 refills | Status: DC
Start: 2020-03-12 — End: 2020-04-19

## 2020-03-12 MED ORDER — FLUCONAZOLE 150 MG PO TABS
150.0000 mg | ORAL_TABLET | Freq: Once | ORAL | 0 refills | Status: AC
Start: 2020-03-12 — End: 2020-03-12

## 2020-04-18 NOTE — Progress Notes (Signed)
Name: Patricia Horn   MRN: [id number]    DOB: 1990/07/19   Date:04/19/2020       Progress Note  Subjective  Chief Complaint  Follow up   HPI   Depression/GAD:  She is taking Duloxetine and seeing [name] therapist. She states symptoms worse from Nov till March, shorter days, cold weather and mother died 05-27-18. She has intrusive suicidal thoughts, very random and not very frequent. She denies any planning, states medication and therapy seems to help. Discussed UV light, go back to regular physical activity and increase therapy to twice a month if possible   Insomnia: she has noticed difficulty staying asleep, it has been going on for the past year, since her mother died May 27, 2018. She is taking half dose of Trazodone and Melatonin gummies. She is able to fall and stay asleep, but feels groggy when she first wakes up. Discussed taking medication earlier in the evening   Allergies: she is now under the care of Dr. Richardson Landry, started allergy shots Summer 2021 and seems to be helping with symptoms. She states nasal congestion has improved, she still has rhinorrhea with cold temperatures, no recent episodes of hives   Patient Active Problem List   Diagnosis Date Noted  . Uterine leiomyoma 03/02/2019  . Overweight (BMI 25.0-29.9) 03/02/2019  . Dysthymia 11/11/2018  . Bilateral hand pain 12/28/2017  . Right carpal tunnel syndrome 11/10/2016  . Urticaria 11/09/2015  . Umbilical cyst 62/37/6283  . Perennial allergic rhinitis 11/16/2014  . ANA positive 10/06/2014  . Anxiety and depression 10/06/2014    Past Surgical History:  Procedure Laterality Date  . HERNIA REPAIR  1517   umbilical   . MYOMECTOMY  2015    Family History  Problem Relation Age of Onset  . Hypertension Mother   . Scleroderma Mother   . Pulmonary fibrosis Mother   . Asthma Mother   . Cancer Neg Hx     Social History   Tobacco Use  . Smoking status: Never Smoker  . Smokeless tobacco: Never Used  Substance  Use Topics  . Alcohol use: Yes    Alcohol/week: 1.0 standard drink    Types: 1 Standard drinks or equivalent per week    Comment: ocassional     Current Outpatient Medications:  .  DULoxetine (CYMBALTA) 60 MG capsule, Take 1 capsule (60 mg total) by mouth every morning., Disp: 90 capsule, Rfl: 1 .  hydrOXYzine (ATARAX/VISTARIL) 10 MG tablet, Take 1-2 tablets (10-20 mg total) by mouth 3 (three) times daily as needed., Disp: 90 tablet, Rfl: 0 .  levocetirizine (XYZAL) 5 MG tablet, Take 1 tablet (5 mg total) by mouth every evening., Disp: 90 tablet, Rfl: 0 .  norethindrone-ethinyl estradiol (JUNEL FE 1/20) 1-20 MG-MCG tablet, Take 1 tablet by mouth daily., Disp: 28 tablet, Rfl: 4 .  traZODone (DESYREL) 50 MG tablet, Take 0.5-1 tablets (25-50 mg total) by mouth at bedtime as needed. for sleep, Disp: 90 tablet, Rfl: 0  Allergies  Allergen Reactions  . Peanut Oil Swelling    Eyelids swell.  . Peanut-Containing Drug Products     I personally reviewed active problem list, medication list, allergies, family history, social history, health maintenance with the patient/caregiver today.   ROS  Constitutional: Negative for fever or weight change.  Respiratory: Negative for cough and shortness of breath.   Cardiovascular: Negative for chest pain or palpitations.  Gastrointestinal: Negative for abdominal pain, no bowel changes.  Musculoskeletal: Negative for gait problem or joint swelling.  Skin: Negative for rash.  Neurological: Negative for dizziness or headache.  No other specific complaints in a complete review of systems (except as listed in HPI above).  Objective  Vitals:   04/19/20 0957  BP: 112/72  Pulse: 88  Resp: 16  Temp: 98.2 F (36.8 C)  TempSrc: Oral  SpO2: 99%  Weight: 175 lb (79.4 kg)  Height: 5\' 3"  (1.6 m)    Body mass index is 31 kg/m.  Physical Exam  Constitutional: Patient appears well-developed and well-nourished. Obese No distress.  HEENT: head  atraumatic, normocephalic, pupils equal and reactive to light, neck supple Cardiovascular: Normal rate, regular rhythm and normal heart sounds.  No murmur heard. No BLE edema. Pulmonary/Chest: Effort normal and breath sounds normal. No respiratory distress. Abdominal: Soft.  There is no tenderness. Psychiatric: Patient has a normal mood and affect. behavior is normal. Judgment and thought content normal.  PHQ2/9: Depression screen [facility] 2/9 04/19/2020 12/06/2019 12/06/2019 10/19/2019 07/12/2019  Decreased Interest 2 0 0 0 0  Down, Depressed, Hopeless 2 0 0 0 0  PHQ - 2 Score 4 0 0 0 0  Altered sleeping 0 0 0 0 3  Tired, decreased energy 3 0 0 0 3  Change in appetite 0 0 0 0 0  Feeling bad or failure about yourself  0 0 0 0 0  Trouble concentrating 0 0 0 0 0  Moving slowly or fidgety/restless 0 0 0 0 0  Suicidal thoughts 0 0 0 1 0  PHQ-9 Score 7 0 0 1 6  Difficult doing work/chores Not difficult at all - Not difficult at all Not difficult at all Not difficult at all  Some recent data might be hidden    phq 9 is positive   Fall Risk: Fall Risk  04/19/2020 12/06/2019 12/06/2019 10/19/2019 07/12/2019  Falls in the past year? 0 1 1 1  0  Number falls in past yr: 0 0 0 0 0  Injury with Fall? 0 - 0 1 0  Follow up - - - - -     Functional Status Survey: Is the patient deaf or have difficulty hearing?: No Does the patient have difficulty seeing, even when wearing glasses/contacts?: No Does the patient have difficulty concentrating, remembering, or making decisions?: No Does the patient have difficulty walking or climbing stairs?: No Does the patient have difficulty dressing or bathing?: No Does the patient have difficulty doing errands alone such as visiting a doctor's office or shopping?: No    Assessment & Plan  1. Dysthymia  - DULoxetine (CYMBALTA) 60 MG capsule; Take 1 capsule (60 mg total) by mouth every morning.  Dispense: 90 capsule; Refill: 1  2. GAD (generalized anxiety  disorder)  - DULoxetine (CYMBALTA) 60 MG capsule; Take 1 capsule (60 mg total) by mouth every morning.  Dispense: 90 capsule; Refill: 1  3. Environmental allergies   4. Allergy to cats   5. Psychophysiological insomnia  - traZODone (DESYREL) 50 MG tablet; Take 0.5-1 tablets (25-50 mg total) by mouth at bedtime as needed. for sleep  Dispense: 90 tablet; Refill: 0

## 2020-04-19 ENCOUNTER — Ambulatory Visit: Payer: 59 | Admitting: Family Medicine

## 2020-04-19 ENCOUNTER — Other Ambulatory Visit: Payer: Self-pay

## 2020-04-19 ENCOUNTER — Encounter: Payer: Self-pay | Admitting: Family Medicine

## 2020-04-19 DIAGNOSIS — F5104 Psychophysiologic insomnia: Secondary | ICD-10-CM

## 2020-04-19 DIAGNOSIS — Z9109 Other allergy status, other than to drugs and biological substances: Secondary | ICD-10-CM

## 2020-04-19 DIAGNOSIS — F341 Dysthymic disorder: Secondary | ICD-10-CM | POA: Diagnosis not present

## 2020-04-19 DIAGNOSIS — J3081 Allergic rhinitis due to animal (cat) (dog) hair and dander: Secondary | ICD-10-CM

## 2020-04-19 DIAGNOSIS — F411 Generalized anxiety disorder: Secondary | ICD-10-CM

## 2020-04-19 MED ORDER — DULOXETINE HCL 60 MG PO CPEP: 60.0000 mg | ORAL_CAPSULE | ORAL | 1 refills | Status: DC

## 2020-04-19 MED ORDER — TRAZODONE HCL 50 MG PO TABS: 25.0000 mg | ORAL_TABLET | Freq: Every evening | ORAL | 0 refills | Status: DC | PRN

## 2020-06-11 ENCOUNTER — Other Ambulatory Visit: Payer: Self-pay

## 2020-06-11 ENCOUNTER — Encounter: Payer: Self-pay | Admitting: Family Medicine

## 2020-06-11 ENCOUNTER — Telehealth (INDEPENDENT_AMBULATORY_CARE_PROVIDER_SITE_OTHER): Payer: 59 | Admitting: Family Medicine

## 2020-06-11 DIAGNOSIS — F330 Major depressive disorder, recurrent, mild: Secondary | ICD-10-CM

## 2020-06-11 MED ORDER — DULOXETINE HCL 30 MG PO CPEP: 30.0000 mg | ORAL_CAPSULE | Freq: Every day | ORAL | 0 refills | Status: DC

## 2020-06-11 NOTE — Progress Notes (Signed)
Name: Patricia Horn   MRN: [id number]    DOB: 02-18-1991   Date:06/11/2020       Progress Note  Subjective  Chief Complaint  Chief Complaint  Patient presents with  . Medication Problem    Would like Duloxetine increased    I connected with  [name]  on 06/11/20 at  1:00 PM EST by a telephone  and verified that I am speaking with the correct person using two identifiers.  I discussed the limitations of evaluation and management by telemedicine and the availability of in person appointments. The patient expressed understanding and agreed to proceed with the virtual visit  Staff also discussed with the patient that there may be a patient responsible charge related to this service. Patient Location: at home  Provider Location: [facility] Additional Individuals present: alone   HPI   Depression/GAD: She is taking Duloxetine and seeing [name] therapist - had a visit last week and talked to him today. She states symptoms worse from Nov till March, she was doing well, however over the past couple of weeks she has noticed lack of interest, not cleaning her house, still able to shower and going to work . Her mother died 25-May-2018. She has intrusive suicidal thoughts, very random and not very frequent. She denies any planning, states medication and therapy seems to help. She would like to try going up on dose of duloxetine. 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   She recently resumed going back to the gym, but she is not doing anything for fun. She went out to eat with a friend about one week ago but not on a regular basis   Patient Active Problem List   Diagnosis Date Noted  . Environmental allergies 04/19/2020  . Allergy to cats 04/19/2020  . Uterine leiomyoma 03/02/2019  . Overweight (BMI 25.0-29.9) 03/02/2019  . Dysthymia 11/11/2018  . Bilateral hand pain 12/28/2017  . Right carpal tunnel syndrome 11/10/2016   . Urticaria 11/09/2015  . Umbilical cyst 24/58/0998  . Perennial allergic rhinitis 11/16/2014  . ANA positive 10/06/2014  . Anxiety and depression 10/06/2014    Past Surgical History:  Procedure Laterality Date  . HERNIA REPAIR  3382   umbilical   . MYOMECTOMY  2015    Family History  Problem Relation Age of Onset  . Hypertension Mother   . Scleroderma Mother   . Pulmonary fibrosis Mother   . Asthma Mother   . Cancer Neg Hx      Current Outpatient Medications:  .  DULoxetine (CYMBALTA) 60 MG capsule, Take 1 capsule (60 mg total) by mouth every morning., Disp: 90 capsule, Rfl: 1 .  hydrOXYzine (ATARAX/VISTARIL) 10 MG tablet, Take 1-2 tablets (10-20 mg total) by mouth 3 (three) times daily as needed., Disp: 90 tablet, Rfl: 0 .  levocetirizine (XYZAL) 5 MG tablet, Take 1 tablet (5 mg total) by mouth every evening., Disp: 90 tablet, Rfl: 0 .  norethindrone-ethinyl estradiol (JUNEL FE 1/20) 1-20 MG-MCG tablet, Take 1 tablet by mouth daily., Disp: 28 tablet, Rfl: 4 .  traZODone (DESYREL) 50 MG tablet, Take 0.5-1 tablets (25-50 mg total) by mouth at bedtime as needed. for sleep, Disp: 90 tablet, Rfl: 0  Allergies  Allergen Reactions  . Peanut Oil Swelling    Eyelids swell.  . Peanut-Containing Drug Products     I personally reviewed active problem list, medication list, allergies, family  history, social history, health maintenance with the patient/caregiver today.   ROS  Ten systems reviewed and is negative except as mentioned in HPI   Objective  Virtual encounter, vitals not obtained.  There is no height or weight on file to calculate BMI.  Physical Exam  Awake, alert and oriented   PHQ2/9: Depression screen [facility] 2/9 06/11/2020 04/19/2020 12/06/2019 12/06/2019 10/19/2019  Decreased Interest 1 2 0 0 0  Down, Depressed, Hopeless 1 2 0 0 0  PHQ - 2 Score 2 4 0 0 0  Altered sleeping 1 0 0 0 0  Tired, decreased energy 3 3 0 0 0  Change in appetite 0 0 0 0 0  Feeling bad  or failure about yourself  1 0 0 0 0  Trouble concentrating 1 0 0 0 0  Moving slowly or fidgety/restless 0 0 0 0 0  Suicidal thoughts 1 0 0 0 1  PHQ-9 Score 9 7 0 0 1  Difficult doing work/chores - Not difficult at all - Not difficult at all Not difficult at all  Some recent data might be hidden   PHQ-2/9 Result is positive.    Fall Risk: Fall Risk  06/11/2020 04/19/2020 12/06/2019 12/06/2019 10/19/2019  Falls in the past year? 0 0 1 1 1   Number falls in past yr: 0 0 0 0 0  Injury with Fall? 0 0 - 0 1  Follow up Falls evaluation completed - - - -     Assessment & Plan  1. MDD (major depressive disorder), recurrent episode, mild (HCC)  - DULoxetine (CYMBALTA) 30 MG capsule; Take 1 capsule (30 mg total) by mouth daily.  Dispense: 90 capsule; Refill: 0  She will contact Ovid Curd again, discussed self care, continue physical activity, we will move her visit to 4-6 weeks from now   I discussed the assessment and treatment plan with the patient. The patient was provided an opportunity to ask questions and all were answered. The patient agreed with the plan and demonstrated an understanding of the instructions.  The patient was advised to call back or seek an in-person evaluation if the symptoms worsen or if the condition fails to improve as anticipated.  I provided 15  minutes of non-face-to-face time during this encounter.

## 2020-06-14 ENCOUNTER — Other Ambulatory Visit: Payer: Self-pay

## 2020-06-14 MED ORDER — NORETHIN ACE-ETH ESTRAD-FE 1-20 MG-MCG PO TABS: 1.0000 | ORAL_TABLET | Freq: Every day | ORAL | 10 refills | Status: DC

## 2020-06-15 ENCOUNTER — Telehealth: Payer: 59 | Admitting: Family Medicine

## 2020-07-23 NOTE — Progress Notes (Signed)
Name: Patricia Horn   MRN: [id number]    DOB: 02-15-1991   Date:07/25/2020       Progress Note  Subjective  Chief Complaint  Follow Up  HPI  Depression/GAD: She is taking Duloxetine and seeing [name] therapist - had a visit last week and talked to him today. Symptoms are worse from Nov till March. She came in last month because she was feeling much more depressed, lack of motivation, sleeping more than usual.  She has intrusive suicidal thoughts and is also worse during winter months. We increase dose of Duloxetine from 60 mg to 90 mg daily and she has noticed that depression symptoms improved, more motivation - she is back at the gym Friday, Saturday and Sundays. She states anxiety has not really improved, she feels overwhelmed at work, one of the employees quit and they are trying to hire someone else    Patient Active Problem List   Diagnosis Date Noted  . Environmental allergies 04/19/2020  . Allergy to cats 04/19/2020  . Uterine leiomyoma 03/02/2019  . Overweight (BMI 25.0-29.9) 03/02/2019  . Dysthymia 11/11/2018  . Bilateral hand pain 12/28/2017  . Right carpal tunnel syndrome 11/10/2016  . Urticaria 11/09/2015  . Umbilical cyst 28/00/3491  . Perennial allergic rhinitis 11/16/2014  . ANA positive 10/06/2014  . Anxiety and depression 10/06/2014    Past Surgical History:  Procedure Laterality Date  . HERNIA REPAIR  7915   umbilical   . MYOMECTOMY  2015    Family History  Problem Relation Age of Onset  . Hypertension Mother   . Scleroderma Mother   . Pulmonary fibrosis Mother   . Asthma Mother   . Cancer Neg Hx     Social History   Tobacco Use  . Smoking status: Never Smoker  . Smokeless tobacco: Never Used  Substance Use Topics  . Alcohol use: Yes    Alcohol/week: 1.0 standard drink    Types: 1 Standard drinks or equivalent per week    Comment: ocassional     Current Outpatient Medications:  .  DULoxetine (CYMBALTA) 30 MG capsule, Take 1 capsule  (30 mg total) by mouth daily., Disp: 90 capsule, Rfl: 0 .  DULoxetine (CYMBALTA) 60 MG capsule, Take 1 capsule (60 mg total) by mouth every morning., Disp: 90 capsule, Rfl: 1 .  hydrOXYzine (ATARAX/VISTARIL) 10 MG tablet, Take 1-2 tablets (10-20 mg total) by mouth 3 (three) times daily as needed., Disp: 90 tablet, Rfl: 0 .  norethindrone-ethinyl estradiol (JUNEL FE 1/20) 1-20 MG-MCG tablet, Take 1 tablet by mouth daily., Disp: 28 tablet, Rfl: 10 .  traZODone (DESYREL) 50 MG tablet, Take 0.5-1 tablets (25-50 mg total) by mouth at bedtime as needed. for sleep, Disp: 90 tablet, Rfl: 0 .  levocetirizine (XYZAL) 5 MG tablet, Take 1 tablet (5 mg total) by mouth every evening. (Patient not taking: Reported on 07/25/2020), Disp: 90 tablet, Rfl: 0  Allergies  Allergen Reactions  . Peanut Oil Swelling    Eyelids swell.  . Peanut-Containing Drug Products     I personally reviewed active problem list, medication list, allergies, family history, social history, health maintenance, notes from last encounter with the patient/caregiver today.   ROS  Constitutional: Negative for fever or weight change.  Respiratory: Negative for cough and shortness of breath.   Cardiovascular: Negative for chest pain or palpitations.  Gastrointestinal: Negative for abdominal pain, no bowel changes.  Musculoskeletal: Negative for gait problem or joint swelling.  Skin: Negative for rash.  Neurological:  Negative for dizziness or headache.  No other specific complaints in a complete review of systems (except as listed in HPI above).  Objective  Vitals:   07/25/20 1341  BP: 112/78  Pulse: (!) 109  Resp: 16  Temp: 98.1 F (36.7 C)  TempSrc: Oral  SpO2: 98%  Weight: 178 lb (80.7 kg)  Height: 5\' 3"  (1.6 m)    Body mass index is 31.53 kg/m.  Physical Exam  Constitutional: Patient appears well-developed and well-nourished. Obese No distress.  HEENT: head atraumatic, normocephalic, pupils equal and reactive to  light, neck supple Cardiovascular: Normal rate, regular rhythm and normal heart sounds.  No murmur heard. No BLE edema. Pulmonary/Chest: Effort normal and breath sounds normal. No respiratory distress. Abdominal: Soft.  There is no tenderness. Psychiatric: Patient has a normal mood and affect. behavior is normal. Judgment and thought content normal.  PHQ2/9: Depression screen [facility] 2/9 07/25/2020 06/11/2020 04/19/2020 12/06/2019 12/06/2019  Decreased Interest 0 1 2 0 0  Down, Depressed, Hopeless 0 1 2 0 0  PHQ - 2 Score 0 2 4 0 0  Altered sleeping 1 1 0 0 0  Tired, decreased energy 3 3 3  0 0  Change in appetite 0 0 0 0 0  Feeling bad or failure about yourself  0 1 0 0 0  Trouble concentrating 0 1 0 0 0  Moving slowly or fidgety/restless 0 0 0 0 0  Suicidal thoughts 0 1 0 0 0  PHQ-9 Score 4 9 7  0 0  Difficult doing work/chores - - Not difficult at all - Not difficult at all  Some recent data might be hidden    phq 9 is negative  GAD 7 : Generalized Anxiety Score 07/25/2020 06/11/2020 04/19/2020 07/12/2019  Nervous, Anxious, on Edge 1 1 0 0  Control/stop worrying 0 0 0 0  Worry too much - different things 1 1 0 1  Trouble relaxing 0 0 0 0  Restless 0 0 0 0  Easily annoyed or irritable 3 3 3 3   Afraid - awful might happen 0 0 0 0  Total GAD 7 Score 5 5 3 4   Anxiety Difficulty - Not difficult at all Not difficult at all Somewhat difficult     Fall Risk: Fall Risk  07/25/2020 06/11/2020 04/19/2020 12/06/2019 12/06/2019  Falls in the past year? 0 0 0 1 1  Number falls in past yr: 0 0 0 0 0  Injury with Fall? 0 0 0 - 0  Follow up - Falls evaluation completed - - -     Functional Status Survey: Is the patient deaf or have difficulty hearing?: No Does the patient have difficulty seeing, even when wearing glasses/contacts?: No Does the patient have difficulty concentrating, remembering, or making decisions?: No Does the patient have difficulty walking or climbing stairs?: No Does the patient have  difficulty dressing or bathing?: No Does the patient have difficulty doing errands alone such as visiting a doctor's office or shopping?: No    Assessment & Plan  1. GAD (generalized anxiety disorder)  - DULoxetine (CYMBALTA) 30 MG capsule; Take 3 capsules (90 mg total) by mouth daily.  Dispense: 270 capsule; Refill: 1  2. MDD (major depressive disorder), recurrent episode, mild (HCC)  - DULoxetine (CYMBALTA) 30 MG capsule; Take 3 capsules (90 mg total) by mouth daily.  Dispense: 270 capsule; Refill: 1  3. Psychophysiological insomnia

## 2020-07-25 ENCOUNTER — Other Ambulatory Visit: Payer: Self-pay

## 2020-07-25 ENCOUNTER — Ambulatory Visit (INDEPENDENT_AMBULATORY_CARE_PROVIDER_SITE_OTHER): Payer: 59 | Admitting: Family Medicine

## 2020-07-25 ENCOUNTER — Encounter: Payer: Self-pay | Admitting: Family Medicine

## 2020-07-25 VITALS — BP 112/78 | HR 109 | Temp 98.1°F | Resp 16 | Ht 63.0 in | Wt 178.0 lb

## 2020-07-25 DIAGNOSIS — F5104 Psychophysiologic insomnia: Secondary | ICD-10-CM

## 2020-07-25 DIAGNOSIS — F411 Generalized anxiety disorder: Secondary | ICD-10-CM | POA: Diagnosis not present

## 2020-07-25 DIAGNOSIS — F330 Major depressive disorder, recurrent, mild: Secondary | ICD-10-CM | POA: Diagnosis not present

## 2020-07-25 MED ORDER — DULOXETINE HCL 30 MG PO CPEP
90.0000 mg | ORAL_CAPSULE | Freq: Every day | ORAL | 1 refills | Status: DC
Start: 2020-07-25 — End: 2020-10-22

## 2020-08-24 ENCOUNTER — Encounter: Payer: Self-pay | Admitting: Family Medicine

## 2020-08-24 ENCOUNTER — Ambulatory Visit: Payer: 59 | Admitting: Family Medicine

## 2020-08-24 ENCOUNTER — Other Ambulatory Visit: Payer: Self-pay

## 2020-08-24 VITALS — BP 132/84 | HR 99 | Temp 98.5°F | Resp 16 | Ht 62.0 in | Wt 180.0 lb

## 2020-08-24 DIAGNOSIS — R1033 Periumbilical pain: Secondary | ICD-10-CM

## 2020-08-24 NOTE — Progress Notes (Signed)
4/22/20228:50 AM  Patricia Horn April 16, 1991, 30 y.o., female [id number]  Chief Complaint  Patient presents with  . Hernia    Pain x3 weeks    HPI:   Patient is a 29 y.o. female with past medical history significant for allergies, anxiety and depression who presents today for abdominal pain  Was working out 3 weeks ago and was having pain Then started having issues with reflux Took OTC antacids This resolved that Then started having pain around umbilicus Also had increased bloating last night Had an umbilical hernia repair when she was 3 Has not been taking anything for the pain   Depression screen [facility] 2/9 08/24/2020 07/25/2020 06/11/2020  Decreased Interest 0 0 1  Down, Depressed, Hopeless 0 0 1  PHQ - 2 Score 0 0 2  Altered sleeping - 1 1  Tired, decreased energy - 3 3  Change in appetite - 0 0  Feeling bad or failure about yourself  - 0 1  Trouble concentrating - 0 1  Moving slowly or fidgety/restless - 0 0  Suicidal thoughts - 0 1  PHQ-9 Score - 4 9  Difficult doing work/chores - - -  Some recent data might be hidden    Fall Risk  08/24/2020 07/25/2020 06/11/2020 04/19/2020 12/06/2019  Falls in the past year? 0 0 0 0 1  Number falls in past yr: 0 0 0 0 0  Injury with Fall? 0 0 0 0 -  Follow up - - Falls evaluation completed - -     Allergies  Allergen Reactions  . Peanut Oil Swelling    Eyelids swell.  . Peanut-Containing Drug Products     Prior to Admission medications   Medication Sig Start Date End Date Taking? Authorizing Provider  DULoxetine (CYMBALTA) 30 MG capsule Take 3 capsules (90 mg total) by mouth daily. 07/25/20  Yes Sowles, Drue Stager, MD  hydrOXYzine (ATARAX/VISTARIL) 10 MG tablet Take 1-2 tablets (10-20 mg total) by mouth 3 (three) times daily as needed. 09/23/18  Yes Sowles, Drue Stager, MD  norethindrone-ethinyl estradiol (JUNEL FE 1/20) 1-20 MG-MCG tablet Take 1 tablet by mouth daily. 06/14/20  Yes Rubie Maid, MD  traZODone (DESYREL) 50 MG tablet  Take 0.5-1 tablets (25-50 mg total) by mouth at bedtime as needed. for sleep 04/19/20  Yes Sowles, Drue Stager, MD  DULoxetine (CYMBALTA) 60 MG capsule Take 60 mg by mouth every morning. 08/07/20   [provider]    Past Medical History:  Diagnosis Date  . Anxiety   . Depression   . Environmental allergies   . Fibroid uterus   . Tendinitis   . Umbilical hernia   . Uterine leiomyoma 03/02/2019    Past Surgical History:  Procedure Laterality Date  . HERNIA REPAIR  2993   umbilical   . MYOMECTOMY  2015    Social History   Tobacco Use  . Smoking status: Never Smoker  . Smokeless tobacco: Never Used  Substance Use Topics  . Alcohol use: Yes    Alcohol/week: 1.0 standard drink    Types: 1 Standard drinks or equivalent per week    Comment: ocassional    Family History  Problem Relation Age of Onset  . Hypertension Mother   . Scleroderma Mother   . Pulmonary fibrosis Mother   . Asthma Mother   . Cancer Neg Hx     Review of Systems  Respiratory: Negative.   Cardiovascular: Negative.   Gastrointestinal: Positive for abdominal pain and heartburn. Negative for constipation, diarrhea,  nausea and vomiting.  Genitourinary: Negative.   Neurological: Negative.      OBJECTIVE:  Today's Vitals   08/24/20 0838  BP: 132/84  Pulse: 99  Resp: 16  Temp: 98.5 F (36.9 C)  TempSrc: Oral  SpO2: 99%  Weight: 180 lb (81.6 kg)  Height: 5\' 2"  (1.575 m)   Body mass index is 32.92 kg/m.   Physical Exam Constitutional:      General: She is not in acute distress.    Appearance: Normal appearance. She is not ill-appearing.  HENT:     Head: Normocephalic.  Cardiovascular:     Rate and Rhythm: Normal rate and regular rhythm.     Pulses: Normal pulses.     Heart sounds: Normal heart sounds. No murmur heard. No friction rub. No gallop.   Pulmonary:     Effort: Pulmonary effort is normal. No respiratory distress.     Breath sounds: Normal breath sounds. No stridor. No  wheezing, rhonchi or rales.  Abdominal:     General: Bowel sounds are normal.     Palpations: Abdomen is soft. There is no mass.     Tenderness: There is abdominal tenderness. There is no right CVA tenderness, left CVA tenderness, guarding or rebound.     Hernia: No hernia is present.  Musculoskeletal:     Right lower leg: No edema.     Left lower leg: No edema.  Skin:    General: Skin is warm and dry.  Neurological:     Mental Status: She is alert and oriented to person, place, and time.  Psychiatric:        Mood and Affect: Mood normal.        Behavior: Behavior normal.     No results found for this or any previous visit (from the past 24 hour(s)).  No results found.   ASSESSMENT and PLAN  Problem List Items Addressed This Visit   None   Visit Diagnoses    Periumbilical abdominal pain    -  Primary   Relevant Orders   US Abdomen Complete       Plan . Will follow up with abdominal US . Suggested abdominal binder in the meantime, and reduce abdominal workouts . No suggested changes to pain regimen   Return in about 6 months (around 02/23/2021).    Huston Foley Jencarlos Nicolson, FNP-BC [facility]

## 2020-08-24 NOTE — Patient Instructions (Signed)
Hernia, Adult     A hernia happens when tissue inside your body pushes out through a weak spot in your belly muscles (abdominal wall). This makes a round lump (bulge). The lump may be:  In a scar from surgery that was done in your belly (incisional hernia).  Near your belly button (umbilical hernia).  In your groin (inguinal hernia). Your groin is the area where your leg meets your lower belly (abdomen). This kind of hernia could also be: ? In your scrotum, if you are female. ? In folds of skin around your vagina, if you are female.  In your upper thigh (femoral hernia).  Inside your belly (hiatal hernia). This happens when your stomach slides above the muscle between your belly and your chest (diaphragm). If your hernia is small and it does not cause pain, you may not need treatment. If your hernia is large or it causes pain, you may need surgery. Follow these instructions at home: Activity  Avoid stretching or overusing (straining) the muscles near your hernia. Straining can happen when you: ? Lift something heavy. ? Poop (have a bowel movement).  Do not lift anything that is heavier than 10 lb (4.5 kg), or the limit that you are told, until your doctor says that it is safe.  Use the strength of your legs when you lift something heavy. Do not use only your back muscles to lift. General instructions  Do these things if told by your doctor so you do not have trouble pooping (constipation): ? Drink enough fluid to keep your pee (urine) pale yellow. ? Eat foods that are high in fiber. These include fresh fruits and vegetables, whole grains, and beans. ? Limit foods that are high in fat and processed sugars. These include foods that are fried or sweet. ? Take medicine for trouble pooping.  When you cough, try to cough gently.  You may try to push your hernia in by very gently pressing on it when you are lying down. Do not try to force the bulge back in if it will not push in  easily.  If you are overweight, work with your doctor to lose weight safely.  Do not use any products that have nicotine or tobacco in them. These include cigarettes and e-cigarettes. If you need help quitting, ask your doctor.  If you will be having surgery (hernia repair), watch your hernia for changes in shape, size, or color. Tell your doctor if you see any changes.  Take over-the-counter and prescription medicines only as told by your doctor.  Keep all follow-up visits as told by your doctor. Contact a doctor if:  You get new pain, swelling, or redness near your hernia.  You poop fewer times in a week than normal.  You have trouble pooping.  You have poop (stool) that is more dry than normal.  You have poop that is harder or larger than normal. Get help right away if:  You have a fever.  You have belly pain that gets worse.  You feel sick to your stomach (nauseous).  You throw up (vomit).  Your hernia cannot be pushed in by very gently pressing on it when you are lying down. Do not try to force the bulge back in if it will not push in easily.  Your hernia: ? Changes in shape or size. ? Changes color. ? Feels hard or it hurts when you touch it. These symptoms may represent a serious problem that is an emergency. Do not   wait to see if the symptoms will go away. Get medical help right away. Call your local emergency services (911 in the [place] Summary  A hernia happens when tissue inside your body pushes out through a weak spot in the belly muscles. This creates a bulge.  If your hernia is small and it does not hurt, you may not need treatment. If your hernia is large or it hurts, you may need surgery.  If you will be having surgery, watch your hernia for changes in shape, size, or color. Tell your doctor about any changes. This information is not intended to replace advice given to you by your health care provider. Make sure you discuss any questions you have with  your health care provider. Document Revised: 08/12/2018 Document Reviewed: 01/21/2017 Elsevier Patient Education  [place].

## 2020-08-26 ENCOUNTER — Encounter: Payer: Self-pay | Admitting: Family Medicine

## 2020-09-14 ENCOUNTER — Telehealth: Payer: Self-pay | Admitting: Family Medicine

## 2020-09-14 ENCOUNTER — Other Ambulatory Visit: Payer: Self-pay | Admitting: Family Medicine

## 2020-09-14 DIAGNOSIS — R1033 Periumbilical pain: Secondary | ICD-10-CM

## 2020-09-14 NOTE — Telephone Encounter (Signed)
An abdomen complete was ordered but if there is concerned about a hernia then it needs to be an abdomen limited / they are asking for more clarification/ please advise today or they will have to cancel Mondays appt

## 2020-09-17 ENCOUNTER — Other Ambulatory Visit: Payer: Self-pay

## 2020-09-17 ENCOUNTER — Ambulatory Visit
Admission: RE | Admit: 2020-09-17 | Discharge: 2020-09-17 | Disposition: A | Discharge status: Home / Self Care | Payer: 59 | Source: Ambulatory Visit | Attending: Family Medicine | Admitting: Family Medicine

## 2020-09-17 DIAGNOSIS — R1033 Periumbilical pain: Secondary | ICD-10-CM | POA: Insufficient documentation

## 2020-09-18 ENCOUNTER — Other Ambulatory Visit: Payer: Self-pay | Admitting: Family Medicine

## 2020-09-18 ENCOUNTER — Encounter: Payer: Self-pay | Admitting: Family Medicine

## 2020-09-18 DIAGNOSIS — R1033 Periumbilical pain: Secondary | ICD-10-CM

## 2020-10-19 NOTE — Progress Notes (Signed)
Name: Patricia Horn   MRN: [id number]    DOB: 1991/03/13   Date:10/22/2020       Progress Note  Subjective  Chief Complaint  Follow up   HPI  Depression/ Major GAD:  She is taking Duloxetine and seeing [name] therapist. She has intrusive suicidal thoughts, very random and not very frequent. She denies any planning, states medication and therapy seems to help. She has been back to the gym three times a week for one hour  She has been feeling more anxious, started picking on bottom lip about one month ago after she deveoped a black head and is now a habit that she cannot break. Discussed using a fidget spiners. She will discuss it with therapist. Still taking Duloxetine 90 mg daily and recently also hydroxizine daily instead of prn . She feels overwhelmed lately.    Insomnia: she has noticed difficulty staying asleep, it has been going on for the past year, since her mother died 25-May-2018. She is taking half dose of Trazodone and Melatonin gummies. She is able to fall and stay asleep, she states taking a long time to wake up but not groggy. She does not want to change medication at this time   Allergies: she is now under the care of Dr. Richardson Landry, started allergy shots Summer 2021 and seems to be helping with symptoms. She states nasal congestion has improved, she still has rhinorrhea with cold temperatures, no recent episodes of hives Unchanged.   Patient Active Problem List   Diagnosis Date Noted   Environmental allergies 04/19/2020   Allergy to cats 04/19/2020   Uterine leiomyoma 03/02/2019   Overweight (BMI 25.0-29.9) 03/02/2019   Dysthymia 11/11/2018   Bilateral hand pain 12/28/2017   Right carpal tunnel syndrome 11/10/2016   Urticaria 51/88/4166   Umbilical cyst 11/02/1599   Perennial allergic rhinitis 11/16/2014   ANA positive 10/06/2014   Anxiety and depression 10/06/2014    Past Surgical History:  Procedure Laterality Date   HERNIA REPAIR  0932   umbilical    MYOMECTOMY   2015    Family History  Problem Relation Age of Onset   Hypertension Mother    Scleroderma Mother    Pulmonary fibrosis Mother    Asthma Mother    Cancer Neg Hx     Social History   Tobacco Use   Smoking status: Never   Smokeless tobacco: Never  Substance Use Topics   Alcohol use: Yes    Alcohol/week: 1.0 standard drink    Types: 1 Standard drinks or equivalent per week    Comment: ocassional     Current Outpatient Medications:    DULoxetine (CYMBALTA) 30 MG capsule, Take 3 capsules (90 mg total) by mouth daily., Disp: 270 capsule, Rfl: 1   DULoxetine (CYMBALTA) 60 MG capsule, Take 60 mg by mouth every morning., Disp: , Rfl:    hydrOXYzine (ATARAX/VISTARIL) 10 MG tablet, Take 1-2 tablets (10-20 mg total) by mouth 3 (three) times daily as needed., Disp: 90 tablet, Rfl: 0   norethindrone-ethinyl estradiol (JUNEL FE 1/20) 1-20 MG-MCG tablet, Take 1 tablet by mouth daily., Disp: 28 tablet, Rfl: 10   traZODone (DESYREL) 50 MG tablet, Take 0.5-1 tablets (25-50 mg total) by mouth at bedtime as needed. for sleep, Disp: 90 tablet, Rfl: 0  Allergies  Allergen Reactions   Peanut Oil Swelling    Eyelids swell.   Peanut-Containing Drug Products     I personally reviewed active problem list, medication list, allergies, family history, social  history, health maintenance with the patient/caregiver today.   ROS  Constitutional: Negative for fever or weight change.  Respiratory: Negative for cough and shortness of breath.   Cardiovascular: Negative for chest pain or palpitations.  Gastrointestinal: Negative for abdominal pain, no bowel changes.  Musculoskeletal: Negative for gait problem or joint swelling.  Skin: positive rash on bottom lip from picking on it   Neurological: Negative for dizziness or headache.  No other specific complaints in a complete review of systems (except as listed in HPI above).   Objective  Vitals:   10/22/20 0933  BP: 132/80  Pulse: 94  Resp: 16   Temp: 98.7 F (37.1 C)  TempSrc: Oral  SpO2: 99%  Weight: 179 lb (81.2 kg)  Height: 5\' 2"  (1.575 m)    Body mass index is 32.74 kg/m.  Physical Exam  Constitutional: Patient appears well-developed and well-nourished. Obese  No distress.  HEENT: head atraumatic, normocephalic, pupils equal and reactive to light, neck supple Cardiovascular: Normal rate, regular rhythm and normal heart sounds.  No murmur heard. No BLE edema. Pulmonary/Chest: Effort normal and breath sounds normal. No respiratory distress. Abdominal: Soft.  There is no tenderness. Psychiatric: Patient has a normal mood and affect. behavior is normal. Judgment and thought content normal.   PHQ2/9: Depression screen [facility] 2/9 10/22/2020 08/24/2020 07/25/2020 06/11/2020 04/19/2020  Decreased Interest 0 0 0 1 2  Down, Depressed, Hopeless 2 0 0 1 2  PHQ - 2 Score 2 0 0 2 4  Altered sleeping 0 - 1 1 0  Tired, decreased energy 1 - 3 3 3   Change in appetite 0 - 0 0 0  Feeling bad or failure about yourself  0 - 0 1 0  Trouble concentrating 0 - 0 1 0  Moving slowly or fidgety/restless 0 - 0 0 0  Suicidal thoughts 0 - 0 1 0  PHQ-9 Score 3 - 4 9 7   Difficult doing work/chores - - - - Not difficult at all  Some recent data might be hidden    phq 9 is positive   Fall Risk: Fall Risk  10/22/2020 08/24/2020 07/25/2020 06/11/2020 04/19/2020  Falls in the past year? 0 0 0 0 0  Number falls in past yr: 0 0 0 0 0  Injury with Fall? 0 0 0 0 0  Follow up - - - Falls evaluation completed -      Functional Status Survey: Is the patient deaf or have difficulty hearing?: No Does the patient have difficulty seeing, even when wearing glasses/contacts?: No Does the patient have difficulty concentrating, remembering, or making decisions?: No Does the patient have difficulty walking or climbing stairs?: No Does the patient have difficulty dressing or bathing?: No Does the patient have difficulty doing errands alone such as visiting a  doctor's office or shopping?: No   Assessment & Plan  1. GAD (generalized anxiety disorder)  - hydrOXYzine (ATARAX/VISTARIL) 10 MG tablet; Take 1-2 tablets (10-20 mg total) by mouth 3 (three) times daily as needed.  Dispense: 90 tablet; Refill: 2 - DULoxetine (CYMBALTA) 60 MG capsule; Take 1 capsule (60 mg total) by mouth every morning.  Dispense: 90 capsule; Refill: 1 - DULoxetine (CYMBALTA) 30 MG capsule; Take 3 capsules (90 mg total) by mouth daily.  Dispense: 90 capsule; Refill: 1  2. MDD (major depressive disorder), recurrent episode, mild (HCC)  - DULoxetine (CYMBALTA) 60 MG capsule; Take 1 capsule (60 mg total) by mouth every morning.  Dispense: 90 capsule; Refill: 1 -  DULoxetine (CYMBALTA) 30 MG capsule; Take 3 capsules (90 mg total) by mouth daily.  Dispense: 90 capsule; Refill: 1  3. Environmental allergies  Getting allergy shots through ENT  4. Perennial allergic rhinitis   5. Psychophysiological insomnia  - hydrOXYzine (ATARAX/VISTARIL) 10 MG tablet; Take 1-2 tablets (10-20 mg total) by mouth 3 (three) times daily as needed.  Dispense: 90 tablet; Refill: 2 - traZODone (DESYREL) 50 MG tablet; Take 0.5-1 tablets (25-50 mg total) by mouth at bedtime as needed. for sleep  Dispense: 90 tablet; Refill: 0

## 2020-10-22 ENCOUNTER — Other Ambulatory Visit: Payer: Self-pay

## 2020-10-22 ENCOUNTER — Encounter: Payer: Self-pay | Admitting: Family Medicine

## 2020-10-22 ENCOUNTER — Ambulatory Visit: Payer: 59 | Admitting: Family Medicine

## 2020-10-22 VITALS — BP 132/80 | HR 94 | Temp 98.7°F | Resp 16 | Ht 62.0 in | Wt 179.0 lb

## 2020-10-22 DIAGNOSIS — F411 Generalized anxiety disorder: Secondary | ICD-10-CM | POA: Diagnosis not present

## 2020-10-22 DIAGNOSIS — Z9109 Other allergy status, other than to drugs and biological substances: Secondary | ICD-10-CM | POA: Diagnosis not present

## 2020-10-22 DIAGNOSIS — F330 Major depressive disorder, recurrent, mild: Secondary | ICD-10-CM

## 2020-10-22 DIAGNOSIS — F331 Major depressive disorder, recurrent, moderate: Secondary | ICD-10-CM | POA: Insufficient documentation

## 2020-10-22 DIAGNOSIS — J3089 Other allergic rhinitis: Secondary | ICD-10-CM

## 2020-10-22 DIAGNOSIS — F5104 Psychophysiologic insomnia: Secondary | ICD-10-CM

## 2020-10-22 MED ORDER — HYDROXYZINE HCL 10 MG PO TABS: 10.0000 mg | ORAL_TABLET | Freq: Three times a day (TID) | ORAL | 2 refills | Status: DC | PRN

## 2020-10-22 MED ORDER — DULOXETINE HCL 30 MG PO CPEP: 90.0000 mg | ORAL_CAPSULE | Freq: Every day | ORAL | 1 refills | Status: DC

## 2020-10-22 MED ORDER — TRAZODONE HCL 50 MG PO TABS: 25.0000 mg | ORAL_TABLET | Freq: Every evening | ORAL | 0 refills | Status: DC | PRN

## 2020-10-22 MED ORDER — DULOXETINE HCL 60 MG PO CPEP: 60.0000 mg | ORAL_CAPSULE | Freq: Every morning | ORAL | 1 refills | Status: DC

## 2020-11-07 ENCOUNTER — Encounter: Payer: Self-pay | Admitting: Gastroenterology

## 2020-11-07 ENCOUNTER — Ambulatory Visit: Payer: 59 | Admitting: Gastroenterology

## 2020-11-07 ENCOUNTER — Other Ambulatory Visit: Payer: Self-pay

## 2020-11-07 VITALS — BP 114/78 | HR 120 | Temp 98.6°F | Wt 174.4 lb

## 2020-11-07 DIAGNOSIS — K219 Gastro-esophageal reflux disease without esophagitis: Secondary | ICD-10-CM

## 2020-11-07 DIAGNOSIS — K5900 Constipation, unspecified: Secondary | ICD-10-CM | POA: Diagnosis not present

## 2020-11-07 DIAGNOSIS — R1013 Epigastric pain: Secondary | ICD-10-CM | POA: Diagnosis not present

## 2020-11-07 MED ORDER — OMEPRAZOLE 20 MG PO CPDR: 20.0000 mg | DELAYED_RELEASE_CAPSULE | Freq: Every day | ORAL | 0 refills | Status: DC

## 2020-11-07 MED ORDER — POLYETHYLENE GLYCOL 3350 17 G PO PACK: 17.0000 g | PACK | Freq: Every day | ORAL | 0 refills | Status: AC

## 2020-11-07 NOTE — Progress Notes (Signed)
Patricia Horn [address]  [place]  [place], [place] [address]  Main: [phone]  Fax: [phone]   Gastroenterology Consultation  Referring Provider:     Steele Sizer, MD Primary Care Physician:  Steele Sizer, MD Reason for Consultation:     Abdominal pain        HPI:    Chief Complaint  Patient presents with   New Patient (Initial Visit)   Abdominal Pain    Periumbilical denies radiation and is a dull ache... denies N/V x 2 months... intermittent... abdominal bloating... BM changes was going QD now every 2-3 days, GERD--regurgitation... feeling of needing to burp... Pt has tried OTC Prevacid and ibuprofen with no relief... mother had colon cancer Dx at 105....     Patricia Horn is a 30 y.o. y/o female referred for consultation & management  by Dr. Ancil Boozer, Drue Stager, MD. patient reports abdominal aching pain that she describes as a dull tooth ache, 5/10, nonradiating, associated with bloating.  Started about 3 months ago.  Associated with burping and blanching as well.  Patient took Prevacid for about a month and this helped her symptoms somewhat but then she discontinued the medication has not taken it for at least 1 to 2 months.  No dysphagia.  No weight loss.  Also reports changes in bowel habits and attributes some of this to intake of more protein powders and perhaps less fiber.  Describes going from having a bowel movement every day to only once every 3 to 4 days with straining.  No blood in stool.  Has not taken anything for this.  Past Medical History:  Diagnosis Date   Anxiety    Depression    Environmental allergies    Fibroid uterus    Tendinitis    Umbilical hernia    Uterine leiomyoma 03/02/2019    Past Surgical History:  Procedure Laterality Date   HERNIA REPAIR  8889   umbilical    MYOMECTOMY  2015    Prior to Admission medications   Medication Sig Start Date End Date Taking? Authorizing Provider  DULoxetine (CYMBALTA) 30 MG  capsule Take 3 capsules (90 mg total) by mouth daily. 10/22/20  Yes Sowles, Drue Stager, MD  DULoxetine (CYMBALTA) 60 MG capsule Take 1 capsule (60 mg total) by mouth every morning. 10/22/20  Yes Sowles, Drue Stager, MD  hydrOXYzine (ATARAX/VISTARIL) 10 MG tablet Take 1-2 tablets (10-20 mg total) by mouth 3 (three) times daily as needed. 10/22/20  Yes Sowles, Drue Stager, MD  norethindrone-ethinyl estradiol (JUNEL FE 1/20) 1-20 MG-MCG tablet Take 1 tablet by mouth daily. 06/14/20  Yes Rubie Maid, MD  omeprazole (PRILOSEC) 20 MG capsule Take 1 capsule (20 mg total) by mouth daily. 11/07/20 12/07/20 Yes Patricia Horn B, MD  polyethylene glycol (MIRALAX) 17 g packet Take 17 g by mouth daily. 11/07/20 12/07/20 Yes Virgel Manifold, MD  traZODone (DESYREL) 50 MG tablet Take 0.5-1 tablets (25-50 mg total) by mouth at bedtime as needed. for sleep 10/22/20  Yes Steele Sizer, MD    Family History  Problem Relation Age of Onset   Hypertension Mother    Scleroderma Mother    Pulmonary fibrosis Mother    Asthma Mother    Cancer Neg Hx      Social History   Tobacco Use   Smoking status: Never   Smokeless tobacco: Never  Vaping Use   Vaping Use: Never used  Substance Use Topics   Alcohol use: Yes    Alcohol/week: 1.0 standard  drink    Types: 1 Standard drinks or equivalent per week    Comment: ocassional   Drug use: No    Allergies as of 11/07/2020 - Review Complete 11/07/2020  Allergen Reaction Noted   Peanut oil Swelling 01/12/2018   Peanut-containing drug products  03/30/2018    Review of Systems:    All systems reviewed and negative except where noted in HPI.   Physical Exam:  Constitutional: General:   Alert,  Well-developed, well-nourished, pleasant and cooperative in NAD BP 114/78   Pulse (!) 120   Temp 98.6 F (37 C) (Oral)   Wt 174 lb 6.4 oz (79.1 kg)   BMI 31.90 kg/m   Eyes:  Sclera clear, no icterus.   Conjunctiva pink. PERRLA  Ears:  No scars, lesions or masses, Normal  auditory acuity. Nose:  No deformity, discharge, or lesions. Mouth:  No deformity or lesions, oropharynx pink & moist.  Neck:  Supple; no masses or thyromegaly.  Respiratory: Normal respiratory effort, Normal percussion  Gastrointestinal:  Soft, non-tender and non-distended without masses, hepatosplenomegaly or hernias noted.  No guarding or rebound tenderness.     Cardiac: No clubbing or edema.  No cyanosis. Normal posterior tibial pedal pulses noted.  Lymphatic:  No significant cervical or axillary adenopathy.  Psych:  Alert and cooperative. Normal mood and affect.  Musculoskeletal:  Normal gait. Head normocephalic, atraumatic. Symmetrical without gross deformities. 5/5 Upper and Lower extremity strength bilaterally.  Skin: Warm. Intact without significant lesions or rashes. No jaundice.  Neurologic:  Face symmetrical, tongue midline, Normal sensation to touch;  grossly normal neurologically.  Psych:  Alert and oriented x3, Alert and cooperative. Normal mood and affect.   Labs: CBC    Component Value Date/Time   WBC 8.6 06/15/2019 1419   RBC 4.08 06/15/2019 1419   HGB 13.0 06/15/2019 1419   HGB 12.3 03/02/2019 1028   HCT 38.3 06/15/2019 1419   HCT 36.3 03/02/2019 1028   PLT 380 06/15/2019 1419   PLT 312 03/02/2019 1028   MCV 93.9 06/15/2019 1419   MCV 95 03/02/2019 1028   MCV 90 08/22/2013 0650   MCH 31.9 06/15/2019 1419   MCHC 33.9 06/15/2019 1419   RDW 11.7 06/15/2019 1419   RDW 12.6 03/02/2019 1028   RDW 13.0 08/22/2013 0650   LYMPHSABS 3,285 06/15/2019 1419   LYMPHSABS 2.8 08/22/2013 0650   MONOABS 0.4 08/22/2013 0650   EOSABS 103 06/15/2019 1419   EOSABS 0.1 08/22/2013 0650   BASOSABS 17 06/15/2019 1419   BASOSABS 0.0 08/22/2013 0650   CMP     Component Value Date/Time   NA 138 06/15/2019 1419   NA 141 03/02/2019 1028   K 3.9 06/15/2019 1419   CL 104 06/15/2019 1419   CO2 27 06/15/2019 1419   GLUCOSE 98 06/15/2019 1419   BUN 13 06/15/2019 1419    BUN 16 03/02/2019 1028   CREATININE 0.84 06/15/2019 1419   CALCIUM 9.2 06/15/2019 1419   PROT 6.9 06/15/2019 1419   PROT 7.2 03/02/2019 1028   ALBUMIN 4.6 03/02/2019 1028   AST 17 06/15/2019 1419   ALT 15 06/15/2019 1419   ALKPHOS 76 03/02/2019 1028   BILITOT 0.2 06/15/2019 1419   BILITOT 0.2 03/02/2019 1028   GFRNONAA 93 06/15/2019 1419   GFRAA 108 06/15/2019 1419    Imaging Studies:   Assessment and Plan:   Patricia Horn is a 30 y.o. y/o female has been referred for abdominal pain, abdominal bloating, constipation  Patient has  symptoms consistent with likely GERD and that she is having belching, burping that did improve somewhat but Prevacid, but has returned at this time  PCP note from April 2022 reviewed and they suggested abdominal binder and an abdominal ultrasound.  Abdominal ultrasound did not show any hernias in the periumbilical region  Patient educated extensively on acid reflux lifestyle modification, including buying a bed wedge, not eating 3 hrs before bedtime, diet modifications, and handout given for the same.   Will start PPI for 30 days and if no improvement in symptoms in 2 weeks patient advised to let us know  Her abdominal bloating and pain symptoms are likely also associated with her underlying constipation that is occurring from inadequate fiber intake  High-fiber diet MiraLAX daily with goal of 1-2 soft bowel movements daily.  If not at goal, patient instructed to increase dose to twice daily.  If loose stools with the medication, patient asked to decrease the medication to every other day, or half dose daily.  Patient verbalized understanding  If symptoms do not improve, patient advised to call us back  We will also check H. pylori today given that patient has been off PPI, and patient can start PPI after getting the test today  Dr Patricia Horn  Speech recognition software was used to dictate the above note.

## 2020-11-09 LAB — H. PYLORI BREATH TEST: H pylori Breath Test: NEGATIVE

## 2020-12-12 ENCOUNTER — Other Ambulatory Visit: Payer: Self-pay

## 2020-12-12 ENCOUNTER — Encounter: Payer: Self-pay | Admitting: Family Medicine

## 2020-12-12 ENCOUNTER — Telehealth (INDEPENDENT_AMBULATORY_CARE_PROVIDER_SITE_OTHER): Payer: 59 | Admitting: Family Medicine

## 2020-12-12 DIAGNOSIS — J069 Acute upper respiratory infection, unspecified: Secondary | ICD-10-CM | POA: Diagnosis not present

## 2020-12-12 NOTE — Progress Notes (Signed)
Virtual Visit via Video Note  I connected with Patricia Horn on 12/12/20 at  4:00 PM EDT by a video enabled telemedicine application and verified that I am speaking with the correct person using two identifiers.  Location: Patient: work Provider: [facility]   I discussed the limitations of evaluation and management by telemedicine and the availability of in person appointments. The patient expressed understanding and agreed to proceed.  History of Present Illness:  UPPER RESPIRATORY TRACT INFECTION - symptom onset earlier last week with scratchy throat. Congestion and cough started Saturday.  - fatigue, cough, congestion, runny nose, sore throat - COVID negative Saturday, Monday.   Worst symptom: fatigue Fever: no Cough: yes Shortness of breath: no Wheezing: no Chest pain: yes, with cough Chest tightness: no Chest congestion: no Nasal congestion: yes Runny nose: yes Sneezing: yes Sore throat: yes Headache: yes Ear pain: no  Ear pressure: no  Eyes red/itching:no Eye drainage/crusting: no  Vomiting: no Fatigue: yes Sick contacts: yes, co-worker with sinus infection Context: better Relief with OTC cold/cough medications: yes  Treatments attempted: alka seltzer cold,     Observations/Objective:  Well appearing, in NAD. Speaks in full sentences, no respiratory distress.   Assessment and Plan:  Viral URI Doing well with mild sx, improving with symptomatic care. Not a candidate for COVID treatment given duration of symptoms, outside of quarantine window, no need to retest. Reviewed OTC symptom relief, return and emergency precautions.      I discussed the assessment and treatment plan with the patient. The patient was provided an opportunity to ask questions and all were answered. The patient agreed with the plan and demonstrated an understanding of the instructions.   The patient was advised to call back or seek an in-person evaluation if the symptoms worsen or if the  condition fails to improve as anticipated.  I provided 8 minutes of non-face-to-face time during this encounter.   Myles Gip, DO

## 2020-12-12 NOTE — Patient Instructions (Signed)
It was great to see you!  Our plans for today:  - Continue to use over the counter cold/sinus medications to help with your symptoms. Try Afrin nasal spray to help with nasal congestion.  - If your fatigue is still bothering you after you are healed from your current infection, come back to see [place].   Take care and seek immediate care sooner if you develop any concerns.   Dr. Ky Barban

## 2020-12-17 ENCOUNTER — Telehealth: Payer: 59 | Admitting: Family Medicine

## 2021-01-01 ENCOUNTER — Ambulatory Visit: Payer: 59 | Admitting: Gastroenterology

## 2021-01-23 ENCOUNTER — Ambulatory Visit: Payer: 59 | Admitting: Family Medicine

## 2021-01-23 ENCOUNTER — Other Ambulatory Visit (HOSPITAL_COMMUNITY)
Admission: RE | Admit: 2021-01-23 | Discharge: 2021-01-23 | Disposition: A | Discharge status: Home / Self Care | Payer: 59 | Source: Ambulatory Visit | Attending: Family Medicine | Admitting: Family Medicine

## 2021-01-23 ENCOUNTER — Other Ambulatory Visit: Payer: Self-pay

## 2021-01-23 VITALS — BP 116/72 | HR 87 | Temp 98.6°F | Resp 16 | Ht 62.0 in | Wt 172.7 lb

## 2021-01-23 DIAGNOSIS — N898 Other specified noninflammatory disorders of vagina: Secondary | ICD-10-CM | POA: Insufficient documentation

## 2021-01-23 MED ORDER — METRONIDAZOLE 500 MG PO TABS: 500.0000 mg | ORAL_TABLET | Freq: Two times a day (BID) | ORAL | 0 refills | Status: AC

## 2021-01-23 NOTE — Progress Notes (Signed)
    SUBJECTIVE:   CHIEF COMPLAINT / HPI:   VAGINAL DISCHARGE - h/o BV, previously on flagyl this past August, got online. Gets every month.  - previously on boric acid for suppression, monthly. - no itching, pain, irritation - no recent changes in soaps. Does not use soap in genital area. Wears cotton panties. - planning to change detergent to free and clear, currently using Gain.   Having vaginal discharge for on and off for months. Discharge consistency: thick Discharge color: yellow-green Medications tried: boric acid once monthly. Recent Flagyl in August.  Recent antibiotic use: flagyl in August Sex in last month: yes Possible STD exposure: wearing condoms, monogamous.   Symptoms Fever: no Dysuria:no Vaginal bleeding: no, continuous OCP Abdomen or Pelvic pain: no Back pain: no Genital sores or ulcers:no Rash: no Pain during sex: no Missed menstrual period: continuous OCP  Due for pap, obtains with GYN, next appt 03/2021.  OBJECTIVE:   BP 116/72   Pulse 87   Temp 98.6 F (37 C)   Resp 16   Ht 5\' 2"  (1.575 m)   Wt 172 lb 11.2 oz (78.3 kg)   SpO2 99%   BMI 31.59 kg/m   Gen: well appearing, in NAD Card: Reg rate Lungs: Comfortable WOB on RA Ext: WWP  ASSESSMENT/PLAN:   Vaginal discharge Wet prep obtained, will treat as indicated. Recommend twice weekly boric acid suppository for at least 4 months for adequate BV suppression. Handout provided. F/u prn.      Myles Gip, DO

## 2021-01-23 NOTE — Patient Instructions (Addendum)
It was great to see you!  Our plans for today:  - Take the flagyl twice daily for 7 days. - Take boric acid suppository at night twice weekly for at least 4 months.   We are checking some labs today, we will release these results to your MyChart.  Take care and seek immediate care sooner if you develop any concerns.   Dr. Ky Barban  GO WHITE: Soap: UNSCENTED Dove (white box light green writing) Laundry detergent (underwear)- Dreft or [employer] unscented WHITE 100% cotton panties (NOT just cotton crouch) Sanitary napkin/panty liners: UNSCENTED.  If it doesn't SAY unscented it can have a scent/perfume    NO PERFUMES OR LOTIONS OR POTIONS in the vulvar area (may use regular [place]) Condoms: hypoallergenic only. Non dyed (no color) Toilet papers: white only Wash clothes: use a separate wash cloth. WHITE.  Washed in Dreft.

## 2021-01-23 NOTE — Assessment & Plan Note (Signed)
Wet prep obtained, will treat as indicated. Recommend twice weekly boric acid suppository for at least 4 months for adequate BV suppression. Handout provided. F/u prn.

## 2021-01-24 LAB — CERVICOVAGINAL ANCILLARY ONLY
Bacterial Vaginitis (gardnerella): NEGATIVE
Candida Glabrata: NEGATIVE
Candida Vaginitis: NEGATIVE
Chlamydia: NEGATIVE
Comment: NEGATIVE
Comment: NEGATIVE
Comment: NEGATIVE
Comment: NEGATIVE
Comment: NEGATIVE
Comment: NORMAL
Neisseria Gonorrhea: NEGATIVE
Trichomonas: NEGATIVE

## 2021-03-06 ENCOUNTER — Ambulatory Visit: Payer: 59 | Admitting: Gastroenterology

## 2021-03-06 ENCOUNTER — Ambulatory Visit (INDEPENDENT_AMBULATORY_CARE_PROVIDER_SITE_OTHER): Payer: 59 | Admitting: Obstetrics and Gynecology

## 2021-03-06 ENCOUNTER — Other Ambulatory Visit: Payer: Self-pay

## 2021-03-06 ENCOUNTER — Other Ambulatory Visit (HOSPITAL_COMMUNITY)
Admission: RE | Admit: 2021-03-06 | Discharge: 2021-03-06 | Disposition: A | Discharge status: Home / Self Care | Payer: 59 | Source: Ambulatory Visit | Attending: Obstetrics and Gynecology | Admitting: Obstetrics and Gynecology

## 2021-03-06 ENCOUNTER — Encounter: Payer: Self-pay | Admitting: Obstetrics and Gynecology

## 2021-03-06 VITALS — BP 100/70 | HR 91 | Resp 16 | Ht 62.0 in | Wt 172.8 lb

## 2021-03-06 DIAGNOSIS — Z124 Encounter for screening for malignant neoplasm of cervix: Secondary | ICD-10-CM | POA: Insufficient documentation

## 2021-03-06 DIAGNOSIS — Z23 Encounter for immunization: Secondary | ICD-10-CM | POA: Diagnosis not present

## 2021-03-06 DIAGNOSIS — Z01419 Encounter for gynecological examination (general) (routine) without abnormal findings: Secondary | ICD-10-CM

## 2021-03-06 DIAGNOSIS — R638 Other symptoms and signs concerning food and fluid intake: Secondary | ICD-10-CM | POA: Diagnosis not present

## 2021-03-06 DIAGNOSIS — Z3041 Encounter for surveillance of contraceptive pills: Secondary | ICD-10-CM

## 2021-03-06 DIAGNOSIS — F419 Anxiety disorder, unspecified: Secondary | ICD-10-CM

## 2021-03-06 DIAGNOSIS — E559 Vitamin D deficiency, unspecified: Secondary | ICD-10-CM

## 2021-03-06 DIAGNOSIS — F32A Depression, unspecified: Secondary | ICD-10-CM

## 2021-03-06 NOTE — Patient Instructions (Addendum)
Preventive Care 21-32 Years Old, Female Preventive care refers to lifestyle choices and visits with your health care provider that can promote health and wellness. This includes: A yearly physical exam. This is also called an annual wellness visit. Regular dental and eye exams. Immunizations. Screening for certain conditions. Healthy lifestyle choices, such as: Eating a healthy diet. Getting regular exercise. Not using drugs or products that contain nicotine and tobacco. Limiting alcohol use. What can I expect for my preventive care visit? Physical exam Your health care provider may check your: Height and weight. These may be used to calculate your BMI (body mass index). BMI is a measurement that tells if you are at a healthy weight. Heart rate and blood pressure. Body temperature. Skin for abnormal spots. Counseling Your health care provider may ask you questions about your: Past medical problems. Family's medical history. Alcohol, tobacco, and drug use. Emotional well-being. Home life and relationship well-being. Sexual activity. Diet, exercise, and sleep habits. Work and work environment. Access to firearms. Method of birth control. Menstrual cycle. Pregnancy history. What immunizations do I need? Vaccines are usually given at various ages, according to a schedule. Your health care provider will recommend vaccines for you based on your age, medical history, and lifestyle or other factors, such as travel or where you work. What tests do I need? Blood tests Lipid and cholesterol levels. These may be checked every 5 years starting at age 20. Hepatitis C test. Hepatitis B test. Screening Diabetes screening. This is done by checking your blood sugar (glucose) after you have not eaten for a while (fasting). STD (sexually transmitted disease) testing, if you are at risk. BRCA-related cancer screening. This may be done if you have a family history of breast, ovarian, tubal, or  peritoneal cancers. Pelvic exam and Pap test. This may be done every 3 years starting at age 21. Starting at age 30, this may be done every 5 years if you have a Pap test in combination with an HPV test. Talk with your health care provider about your test results, treatment options, and if necessary, the need for more tests. Follow these instructions at home: Eating and drinking  Eat a healthy diet that includes fresh fruits and vegetables, whole grains, lean protein, and low-fat dairy products. Take vitamin and mineral supplements as recommended by your health care provider. Do not drink alcohol if: Your health care provider tells you not to drink. You are pregnant, may be pregnant, or are planning to become pregnant. If you drink alcohol: Limit how much you have to 0-1 drink a day. Be aware of how much alcohol is in your drink. In the [place] one drink equals one 12 oz bottle of beer (355 mL), one 5 oz glass of wine (148 mL), or one 1 oz glass of hard liquor (44 mL). Lifestyle Take daily care of your teeth and gums. Brush your teeth every morning and night with fluoride toothpaste. Floss one time each day. Stay active. Exercise for at least 30 minutes 5 or more days each week. Do not use any products that contain nicotine or tobacco, such as cigarettes, e-cigarettes, and chewing tobacco. If you need help quitting, ask your health care provider. Do not use drugs. If you are sexually active, practice safe sex. Use a condom or other form of protection to prevent STIs (sexually transmitted infections). If you do not wish to become pregnant, use a form of birth control. If you plan to become pregnant, see your health care provider   for a prepregnancy visit. Find healthy ways to cope with stress, such as: Meditation, yoga, or listening to music. Journaling. Talking to a trusted person. Spending time with friends and family. Safety Always wear your seat belt while driving or riding in a  vehicle. Do not drive: If you have been drinking alcohol. Do not ride with someone who has been drinking. When you are tired or distracted. While texting. Wear a helmet and other protective equipment during sports activities. If you have firearms in your house, make sure you follow all gun safety procedures. Seek help if you have been physically or sexually abused. What's next? Go to your health care provider once a year for an annual wellness visit. Ask your health care provider how often you should have your eyes and teeth checked. Stay up to date on all vaccines. This information is not intended to replace advice given to you by your health care provider. Make sure you discuss any questions you have with your health care provider. Document Revised: 06/29/2020 Document Reviewed: 12/31/2017 Elsevier Patient Education  2022 Elsevier Inc. Breast Self-Awareness Breast self-awareness is knowing how your breasts look and feel. Doing breast self-awareness is important. It allows you to catch a breast problem early while it is still small and can be treated. All women should do breast self-awareness, including women who have had breast implants. Tell your doctor if you notice a change in your breasts. What you need: A mirror. A well-lit room. How to do a breast self-exam A breast self-exam is one way to learn what is normal for your breasts and to check for changes. To do a breast self-exam: Look for changes  Take off all the clothes above your waist. Stand in front of a mirror in a room with good lighting. Put your hands on your hips. Push your hands down. Look at your breasts and nipples in the mirror to see if one breast or nipple looks different from the other. Check to see if: The shape of one breast is different. The size of one breast is different. There are wrinkles, dips, and bumps in one breast and not the other. Look at each breast for changes in the skin, such  as: Redness. Scaly areas. Look for changes in your nipples, such as: Liquid around the nipples. Bleeding. Dimpling. Redness. A change in where the nipples are. Feel for changes  Lie on your back on the floor. Feel each breast. To do this, follow these steps: Pick a breast to feel. Put the arm closest to that breast above your head. Use your other arm to feel the nipple area of your breast. Feel the area with the pads of your three middle fingers by making small circles with your fingers. For the first circle, press lightly. For the second circle, press harder. For the third circle, press even harder. Keep making circles with your fingers at the different pressures as you move down your breast. Stop when you feel your ribs. Move your fingers a little toward the center of your body. Start making circles with your fingers again, this time going up until you reach your collarbone. Keep making up-and-down circles until you reach your armpit. Remember to keep using the three pressures. Feel the other breast in the same way. Sit or stand in the tub or shower. With soapy water on your skin, feel each breast the same way you did in step 2 when you were lying on the floor. Write down what you find   Writing down what you find can help you remember what to tell your doctor. Write down: What is normal for each breast. Any changes you find in each breast, including: The kind of changes you find. Whether you have pain. Size and location of any lumps. When you last had your menstrual period. General tips Check your breasts every month. If you are breastfeeding, the best time to check your breasts is after you feed your baby or after you use a breast pump. If you get menstrual periods, the best time to check your breasts is 5-7 days after your menstrual period is over. With time, you will become comfortable with the self-exam, and you will begin to know if there are changes in your breasts. Contact  a doctor if you: See a change in the shape or size of your breasts or nipples. See a change in the skin of your breast or nipples, such as red or scaly skin. Have fluid coming from your nipples that is not normal. Find a lump or thick area that was not there before. Have pain in your breasts. Have any concerns about your breast health. Summary Breast self-awareness includes looking for changes in your breasts, as well as feeling for changes within your breasts. Breast self-awareness should be done in front of a mirror in a well-lit room. You should check your breasts every month. If you get menstrual periods, the best time to check your breasts is 5-7 days after your menstrual period is over. Let your doctor know of any changes you see in your breasts, including changes in size, changes on the skin, pain or tenderness, or fluid from your nipples that is not normal. This information is not intended to replace advice given to you by your health care provider. Make sure you discuss any questions you have with your health care provider. Document Revised: 12/08/2017 Document Reviewed: 12/08/2017 Elsevier Patient Education  2022 Elsevier Inc.  

## 2021-03-06 NOTE — Progress Notes (Signed)
GYNECOLOGY ANNUAL PHYSICAL EXAM PROGRESS NOTE  Subjective:    Patricia Horn is a 30 y.o. G0P0000 female who presents for an annual exam. The patient has no complaints today. The patient is sexually active (same partner). The patient participates in regular exercise: yes. Has the patient ever been transfused or tattooed?: yes. The patient reports that there is not domestic violence in her life.   Reports seeing her PCP for vaginal discharge last month but had normal vaginal cultures.  Continues to experience discharge (however this has been ongoing for years).   Menstrual History: Menarche age: 51 No LMP recorded (lmp unknown). (Menstrual status: Oral contraceptives). Period Pattern: (!) Irregular (has not had a cycle in almost a year on OCPs).    Gynecologic History:  Contraception: OCP (estrogen/progesterone) History of STI's: Denies Last Pap: 02/23/2018. Results were: normal.  Denies h/o abnormal pap smears. Last mammogram: Not age appropriate    Upstream - 03/06/21 0913       Pregnancy Intention Screening   Does the patient want to become pregnant in the next year? No    Does the patient's partner want to become pregnant in the next year? No    Would the patient like to discuss contraceptive options today? No      Contraception Wrap Up   Current Method Oral Contraceptive    End Method Oral Contraceptive    Contraception Counseling Provided No            The pregnancy intention screening data noted above was reviewed. Potential methods of contraception were discussed. The patient elected to proceed with Oral Contraceptive.    OB History  Gravida Para Term Preterm AB Living  0 0 0 0 0 0  SAB IAB Ectopic Multiple Live Births  0 0 0 0 0  Obstetric Comments  1st Menstrual Cycle:  8    Past Medical History:  Diagnosis Date   Anxiety    Depression    Environmental allergies    Fibroid uterus    Tendinitis    Umbilical hernia    Uterine leiomyoma  03/02/2019    Past Surgical History:  Procedure Laterality Date   HERNIA REPAIR  9741   umbilical    MYOMECTOMY  2015    Family History  Problem Relation Age of Onset   Hypertension Mother    Scleroderma Mother    Pulmonary fibrosis Mother    Asthma Mother    Cancer Neg Hx     Social History   Socioeconomic History   Marital status: Single    Spouse name: Not on file   Number of children: 0   Years of education: Not on file   Highest education level: Master's degree (e.g., MA, MS, MEng, MEd, MSW, MBA)  Occupational History   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   Tobacco Use   Smoking status: Never   Smokeless tobacco: Never  Vaping Use   Vaping Use: Never used  Substance and Sexual Activity   Alcohol use: Yes    Alcohol/week: 1.0 standard drink    Types: 1 Standard drinks or equivalent per week    Comment: ocassional   Drug use: No   Sexual activity: Not Currently    Partners: Male    Birth control/protection: Pill  Other Topics Concern   Not on file  Social History Narrative   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 90 capsule 1   DULoxetine (CYMBALTA) 60 MG capsule Take 1 capsule (60 mg total) by mouth every morning. 90 capsule 1   hydrOXYzine (ATARAX/VISTARIL) 10 MG tablet Take 1-2 tablets (10-20 mg total) by mouth 3 (three) times daily as needed. 90 tablet 2    norethindrone-ethinyl estradiol (JUNEL FE 1/20) 1-20 MG-MCG tablet Take 1 tablet by mouth daily. 28 tablet 10   traZODone (DESYREL) 50 MG tablet Take 0.5-1 tablets (25-50 mg total) by mouth at bedtime as needed. for sleep 90 tablet 0   No current facility-administered medications on file prior to visit.    Allergies  Allergen Reactions   Peanut Oil Swelling    Eyelids swell.   Peanut-Containing Drug Products      Review of Systems Constitutional: negative for chills, fatigue, fevers and sweats Eyes: negative for irritation, redness and visual disturbance Ears, nose, mouth, throat, and face: negative for hearing loss, nasal congestion, snoring and tinnitus Respiratory: negative for asthma, cough, sputum Cardiovascular: negative for chest pain, dyspnea, exertional chest pressure/discomfort, irregular heart beat, palpitations and syncope Gastrointestinal: negative for abdominal pain, change in bowel habits, nausea and vomiting Genitourinary: negative for abnormal menstrual periods, genital lesions, sexual problems and vaginal discharge, dysuria and urinary incontinence Integument/breast: negative for breast lump, breast tenderness and nipple discharge Hematologic/lymphatic: negative for bleeding and easy bruising Musculoskeletal:negative for back pain and muscle weakness Neurological: negative for dizziness, headaches, vertigo and weakness Endocrine: negative for diabetic symptoms including polydipsia, polyuria and skin dryness Allergic/Immunologic: negative for hay fever and urticaria      Objective:  Blood pressure 100/70, pulse 91, resp. rate 16, height 5\' 2"  (1.575 m), weight 172 lb 12.8 oz (78.4 kg).  Body mass index is 31.61 kg/m.  General Appearance:    Alert, cooperative, no distress, appears stated age  Head:    Normocephalic, without obvious abnormality, atraumatic  Eyes:    PERRL, conjunctiva/corneas clear, EOM's intact, both eyes  Ears:    Normal external ear canals,  both ears  Nose:   Nares normal, septum midline, mucosa normal, no drainage or sinus tenderness  Throat:   Lips, mucosa, and tongue normal; teeth and gums normal  Neck:   Supple, symmetrical, trachea midline, no adenopathy; thyroid: no enlargement/tenderness/nodules; no carotid bruit or JVD  Back:     Symmetric, no curvature, ROM normal, no CVA tenderness  Lungs:     Clear to auscultation bilaterally, respirations unlabored  Chest Wall:    No tenderness or deformity   Heart:    Regular rate and rhythm, S1 and S2 normal, no murmur, rub or gallop  Breast Exam:    No tenderness, masses, or nipple abnormality  Abdomen:     Soft, non-tender, bowel sounds active all four quadrants, no masses, no organomegaly.    Genitalia:    Pelvic:external genitalia normal, vagina without lesions, or tenderness, rectovaginal septum normal. Small amount of mucoid discharge present. Cervix normal in  appearance, no cervical motion tenderness, no adnexal masses or tenderness.  Uterus normal size, shape, mobile, regular contours, nontender.  Rectal:    Normal external sphincter.  No hemorrhoids appreciated. Internal exam not done.   Extremities:   Extremities normal, atraumatic, no cyanosis or edema  Pulses:   2+ and symmetric all extremities  Skin:   Skin color, texture, turgor normal, no rashes or lesions  Lymph nodes:   Cervical, supraclavicular, and axillary nodes normal  Neurologic:   CNII-XII intact, normal strength, sensation and reflexes throughout   .  Labs:  Lab Results  Component Value Date   WBC 8.6 06/15/2019   HGB 13.0 06/15/2019   HCT 38.3 06/15/2019   MCV 93.9 06/15/2019   PLT 380 06/15/2019    Lab Results  Component Value Date   CREATININE 0.84 06/15/2019   BUN 13 06/15/2019   NA 138 06/15/2019   K 3.9 06/15/2019   CL 104 06/15/2019   CO2 27 06/15/2019    Lab Results  Component Value Date   ALT 15 06/15/2019   AST 17 06/15/2019   ALKPHOS 76 03/02/2019   BILITOT 0.2 06/15/2019     Lab Results  Component Value Date   TSH 2.01 06/15/2019     Assessment:   1. Well woman exam with routine gynecological exam   2. Encounter for surveillance of contraceptive pills   3. Increased BMI   4. Anxiety and depression   5. Vitamin D insufficiency   6. Cervical cancer screening   7. Needs flu shot      Plan:  Blood tests: CBC with diff, Comprehensive metabolic panel, Lipoproteins, Hemoglobin A1c and Vitamin D. Breast self exam technique reviewed and patient encouraged to perform self-exam monthly. Contraception: OCP (estrogen/progesterone).  Discussed healthy lifestyle modifications. Mammogram  Not age appropriate Pap smear ordered. COVID vaccination status: received Moderna vaccine x 2 doses. Is elegible.  Flu vaccine given today.  Anxiety and depression managed by therapist. Currently on Cymbalta and Vistaril. Follow up in 1 year for annual exam   Askari Kinley,MD [facility]

## 2021-03-08 ENCOUNTER — Other Ambulatory Visit: Payer: Self-pay

## 2021-03-08 ENCOUNTER — Other Ambulatory Visit: Payer: 59

## 2021-03-09 LAB — COMPREHENSIVE METABOLIC PANEL
ALT: 13 IU/L (ref 0–32)
AST: 15 IU/L (ref 0–40)
Albumin/Globulin Ratio: 1.8 (ref 1.2–2.2)
Albumin: 4.4 g/dL (ref 3.8–4.8)
Alkaline Phosphatase: 64 IU/L (ref 44–121)
BUN/Creatinine Ratio: 16 (ref 9–23)
BUN: 14 mg/dL (ref 6–20)
Bilirubin Total: 0.2 mg/dL (ref 0.0–1.2)
CO2: 24 mmol/L (ref 20–29)
Calcium: 9.4 mg/dL (ref 8.7–10.2)
Chloride: 102 mmol/L (ref 96–106)
Creatinine, Ser: 0.86 mg/dL (ref 0.57–1.00)
Globulin, Total: 2.5 g/dL (ref 1.5–4.5)
Glucose: 82 mg/dL (ref 70–99)
Potassium: 4.4 mmol/L (ref 3.5–5.2)
Sodium: 139 mmol/L (ref 134–144)
Total Protein: 6.9 g/dL (ref 6.0–8.5)
eGFR: 92 mL/min/{1.73_m2} (ref >59)

## 2021-03-09 LAB — CBC
Hematocrit: 37.9 % (ref 34.0–46.6)
Hemoglobin: 12.9 g/dL (ref 11.1–15.9)
MCH: 32 pg (ref 26.6–33.0)
MCHC: 34 g/dL (ref 31.5–35.7)
MCV: 94 fL (ref 79–97)
Platelets: 361 10*3/uL (ref 150–450)
RBC: 4.03 x10E6/uL (ref 3.77–5.28)
RDW: 12.6 % (ref 11.7–15.4)
WBC: 5.7 10*3/uL (ref 3.4–10.8)

## 2021-03-09 LAB — LIPID PANEL
Chol/HDL Ratio: 2.6 ratio (ref 0.0–4.4)
Cholesterol, Total: 154 mg/dL (ref 100–199)
HDL: 59 mg/dL (ref >39)
LDL Chol Calc (NIH): 80 mg/dL (ref 0–99)
Triglycerides: 77 mg/dL (ref 0–149)
VLDL Cholesterol Cal: 15 mg/dL (ref 5–40)

## 2021-03-09 LAB — HEMOGLOBIN A1C
Est. average glucose Bld gHb Est-mCnc: 108 mg/dL
Hgb A1c MFr Bld: 5.4 % (ref 4.8–5.6)

## 2021-03-09 LAB — VITAMIN D 25 HYDROXY (VIT D DEFICIENCY, FRACTURES): Vit D, 25-Hydroxy: 40.8 ng/mL (ref 30.0–100.0)

## 2021-03-12 LAB — CYTOLOGY - PAP
Comment: NEGATIVE
Comment: NEGATIVE
Diagnosis: NEGATIVE
HPV 16: NEGATIVE
HPV 18 / 45: NEGATIVE
High risk HPV: POSITIVE — AB

## 2021-03-16 ENCOUNTER — Other Ambulatory Visit: Payer: Self-pay | Admitting: Obstetrics and Gynecology

## 2021-03-26 NOTE — Progress Notes (Signed)
Name: Patricia Horn   MRN: [id number]    DOB: 08/26/1990   Date:03/27/2021       Progress Note  Subjective  Chief Complaint  Annual Exam   HPI  Patient presents for annual CPE.  Depression/ Major GAD:  She is taking Duloxetine and seeing [name] therapist once a month. She states currently not doing well because of winter months and also holidays. She states suicidal thoughts not as frequent, but has lack of motivation, feels sad . During the Spring and early Summer she was feeling very anxious and was picking on lower lip but that has improved now and doing better at this time.    Insomnia: she has noticed difficulty staying asleep, it has been going on for the past year, since her mother died 05-19-2018. She is taking half dose of Trazodone and Melatonin gummies. It was working, helping her fall and stay asleep however lately noticing that she has been waking up during the night and unable to fall back asleep, also has weird dreams and feels groggy when she takes a higher dose of medication. We will try Temazepam and if does not work she will send me a message and we will switch to [id number]   HPV positive: discussed results.    Allergies: she is now under the care of Dr. Richardson Landry, started allergy shots Summer 2021 and seems to be helping with symptoms. Unchanged    Diet: discussed balanced diet , struggling financially but still buys good food  Exercise: discussed increasing physical activity    Flowsheet Row Video Visit from 12/12/2020 in [facility]  AUDIT-C Score 0      Depression: Phq 9 is  positive Depression screen [facility] 2/9 03/27/2021 03/06/2021 03/06/2021 01/23/2021 12/12/2020  Decreased Interest 1 0 0 0 0  Down, Depressed, Hopeless 1 2 0 1 0  PHQ - 2 Score 2 2 0 1 0  Altered sleeping 3 3 - 0 0  Tired, decreased energy 2 2 - 0 0  Change in appetite 0 0 - 0 0  Feeling bad or failure about yourself  0 0 - 0 0  Trouble concentrating 0 0 - 0 0  Moving slowly  or fidgety/restless 0 0 - 0 0  Suicidal thoughts 0 0 - 0 0  PHQ-9 Score 7 7 - 1 0  Difficult doing work/chores - Not difficult at all - Not difficult at all -  Some recent data might be hidden   Hypertension: BP Readings from Last 3 Encounters:  03/27/21 126/82  03/06/21 100/70  01/23/21 116/72   Obesity: Wt Readings from Last 3 Encounters:  03/27/21 170 lb (77.1 kg)  03/06/21 172 lb 12.8 oz (78.4 kg)  01/23/21 172 lb 11.2 oz (78.3 kg)   BMI Readings from Last 3 Encounters:  03/27/21 31.09 kg/m  03/06/21 31.61 kg/m  01/23/21 31.59 kg/m     Vaccines:   HPV: up to date   Flu: educated and discussed with patient.  Hep C Screening: 12/07/17 STD testing and prevention (HIV/chl/gon/syphilis): 03/02/19 Intimate partner violence: negative Sexual History : one partner in the past few years, condoms and ocp Menstrual History/LMP/Abnormal Bleeding: she takes ocps continuously and no spotting  Incontinence Symptoms: no problems   Breast cancer:  - Last Mammogram: N/A - BRCA gene screening: N/A  Osteoporosis: Discussed high calcium and vitamin D supplementation, weight bearing exercises  Cervical cancer screening: 03/06/21  Skin cancer: Discussed monitoring for atypical lesions  ECG: N/A  Advanced  Care Planning: A voluntary discussion about advance care planning including the explanation and discussion of advance directives.  Discussed health care proxy and Living will, and the patient was able to identify a health care proxy as her aunt Derenda Fennel   Lipids: Lab Results  Component Value Date   CHOL 154 03/08/2021   CHOL 146 03/02/2019   CHOL 146 12/07/2017   Lab Results  Component Value Date   HDL 59 03/08/2021   HDL 60 03/02/2019   HDL 62 12/07/2017   Lab Results  Component Value Date   LDLCALC 80 03/08/2021   LDLCALC 76 03/02/2019   LDLCALC 69 12/07/2017   Lab Results  Component Value Date   TRIG 77 03/08/2021   TRIG 47 03/02/2019   TRIG 72 12/07/2017    Lab Results  Component Value Date   CHOLHDL 2.6 03/08/2021   CHOLHDL 2.4 03/02/2019   CHOLHDL 2.4 12/07/2017   No results found for: LDLDIRECT  Glucose: Glucose  Date Value Ref Range Status  03/08/2021 82 70 - 99 mg/dL Final  03/02/2019 93 65 - 99 mg/dL Final  10/09/2014 80 65 - 99 mg/dL Final   Glucose, Bld  Date Value Ref Range Status  06/15/2019 98 65 - 99 mg/dL Final    Comment:    .            Fasting reference interval .   12/07/2017 93 65 - 139 mg/dL Final    Comment:    .        Non-fasting reference interval .     Patient Active Problem List   Diagnosis Date Noted   Vaginal discharge 01/23/2021   MDD (major depressive disorder), recurrent episode, mild ([place]) 10/22/2020   Environmental allergies 04/19/2020   Allergy to cats 04/19/2020   Uterine leiomyoma 03/02/2019   Overweight (BMI 25.0-29.9) 03/02/2019   Bilateral hand pain 12/28/2017   Right carpal tunnel syndrome 11/10/2016   Urticaria 41/58/3094   Umbilical cyst 07/68/0881   Perennial allergic rhinitis 11/16/2014   GAD (generalized anxiety disorder) 10/06/2014   ANA positive 10/06/2014    Past Surgical History:  Procedure Laterality Date   HERNIA REPAIR  1031   umbilical    MYOMECTOMY  2015    Family History  Problem Relation Age of Onset   Hypertension Mother    Scleroderma Mother    Pulmonary fibrosis Mother    Asthma Mother    Cancer Neg Hx     Social History   Socioeconomic History   Marital status: Single    Spouse name: Not on file   Number of children: 0   Years of education: Not on file   Highest education level: Master's degree (e.g., MA, MS, MEng, MEd, MSW, MBA)  Occupational History   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   Tobacco Use   Smoking status: Never   Smokeless tobacco: Never  Vaping Use   Vaping Use: Never used  Substance and Sexual Activity   Alcohol use: Yes    Alcohol/week: 1.0 standard drink    Types: 1  Standard drinks or equivalent per week    Comment: ocassional   Drug use: No   Sexual activity: Not Currently    Partners: Male    Birth control/protection: Pill  Other Topics Concern   Not on file  Social History Narrative   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[employer] , Energy manager   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[place], [place]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    Social Determinants of Health   Financial Resource Strain: Low Risk    Difficulty of Paying Living Expenses: Not very hard  Food Insecurity: Food Insecurity Present   Worried About Charity fundraiser in the Last Year: Never true   Arboriculturist in the Last Year: Sometimes true  Transportation Needs: No Transportation Needs   Lack of Transportation (Medical): No   Lack of Transportation (Non-Medical): No  Physical Activity: Insufficiently Active   Days of Exercise per Week: 3 days   Minutes of Exercise per Session: 30 min  Stress: Stress Concern Present   Feeling of Stress : Rather much  Social Connections: Moderately Integrated   Frequency of Communication with Friends and Family: More than three times a week   Frequency of Social Gatherings with Friends and Family: Once a week   Attends Religious Services: More than 4 times per year   Active Member of [employer] or Organizations: Yes   Attends Archivist Meetings: 1 to 4 times per year   Marital Status: Never married  Human resources officer Violence: Not At Risk   Fear of Current or Ex-Partner: No   Emotionally Abused: No   Physically Abused: No   Sexually Abused: No     Current Outpatient Medications:    DULoxetine (CYMBALTA) 30 MG capsule, Take 3 capsules (90 mg total) by mouth Patricia., Disp: 90 capsule, Rfl: 1   DULoxetine (CYMBALTA) 60 MG capsule, Take 1 capsule (60 mg total) by mouth every morning., Disp: 90 capsule, Rfl: 1   hydrOXYzine (ATARAX/VISTARIL) 10 MG tablet, Take 1-2 tablets (10-20 mg total) by mouth 3 (three) times Patricia as  needed., Disp: 90 tablet, Rfl: 2   LARIN FE 1/20 1-20 MG-MCG tablet, Take 1 tablet by mouth once Patricia, Disp: 84 tablet, Rfl: 3   traZODone (DESYREL) 50 MG tablet, Take 0.5-1 tablets (25-50 mg total) by mouth at bedtime as needed. for sleep, Disp: 90 tablet, Rfl: 0  Allergies  Allergen Reactions   Peanut Oil Swelling    Eyelids swell.   Peanut-Containing Drug Products      ROS  Constitutional: Negative for fever or weight change.  Respiratory: Negative for cough and shortness of breath.   Cardiovascular: Negative for chest pain or palpitations.  Gastrointestinal: Negative for abdominal pain, no bowel changes.  Musculoskeletal: Negative for gait problem or joint swelling.  Skin: Negative for rash.  Neurological: Negative for dizziness or headache.  No other specific complaints in a complete review of systems (except as listed in HPI above).   Objective  Vitals:   03/27/21 0906  BP: 126/82  Pulse: 95  Resp: 16  Temp: 98.3 F (36.8 C)  SpO2: 98%  Weight: 170 lb (77.1 kg)  Height: _0  (1.575 m)    Body mass index is 31.09 kg/m.  Physical Exam  Constitutional: Patient appears well-developed and well-nourished. Obese  No distress.  HEENT: head atraumatic, normocephalic, pupils equal and reactive to light, neck supple Cardiovascular: Normal rate, regular rhythm and normal heart sounds.  No murmur heard. No BLE edema. Pulmonary/Chest: Effort normal and breath sounds normal. No respiratory distress. Abdominal: Soft.  There is no tenderness. Psychiatric: Patient has a normal mood and affect. behavior is normal. Judgment and thought content normal.   Recent Results (from the past 2160 hour(s))  Cervicovaginal ancillary only     Status: None   Collection Time: 01/23/21  9:52 AM  Result Value Ref Range   Neisseria Gonorrhea Negative    Chlamydia Negative    Trichomonas Negative    Bacterial Vaginitis (gardnerella) Negative    Candida Vaginitis Negative    Candida  Glabrata Negative    Comment      Normal Reference Range Bacterial Vaginosis - Negative   Comment Normal Reference Range Candida Species - Negative    Comment Normal Reference Range Candida Galbrata - Negative    Comment Normal Reference Range Trichomonas - Negative    Comment Normal Reference Ranger Chlamydia - Negative    Comment      Normal Reference Range Neisseria Gonorrhea - Negative  Cytology - PAP     Status: Abnormal   Collection Time: 03/06/21  9:58 AM  Result Value Ref Range   High risk HPV Positive (A)    HPV 16 Negative    HPV 18 / 45 Negative    Adequacy      Satisfactory for evaluation; transformation zone component PRESENT.   Diagnosis      - Negative for intraepithelial lesion or malignancy (NILM)   Comment Normal Reference Range HPV - Negative    Comment Normal Reference Range HPV 16 18 45 -Negative   CBC     Status: None   Collection Time: 03/08/21  9:21 AM  Result Value Ref Range   WBC 5.7 3.4 - 10.8 x10E3/uL   RBC 4.03 3.77 - 5.28 x10E6/uL   Hemoglobin 12.9 11.1 - 15.9 g/dL   Hematocrit 37.9 34.0 - 46.6 %   MCV 94 79 - 97 fL   MCH 32.0 26.6 - 33.0 pg   MCHC 34.0 31.5 - 35.7 g/dL   RDW 12.6 11.7 - 15.4 %   Platelets 361 150 - 450 x10E3/uL  Comp Met (CMET)     Status: None   Collection Time: 03/08/21  9:21 AM  Result Value Ref Range   Glucose 82 70 - 99 mg/dL   BUN 14 6 - 20 mg/dL   Creatinine, Ser 0.86 0.57 - 1.00 mg/dL   eGFR 92 >59 mL/min/1.73   BUN/Creatinine Ratio 16 9 - 23   Sodium 139 134 - 144 mmol/L   Potassium 4.4 3.5 - 5.2 mmol/L   Chloride 102 96 - 106 mmol/L   CO2 24 20 - 29 mmol/L   Calcium 9.4 8.7 - 10.2 mg/dL   Total Protein 6.9 6.0 - 8.5 g/dL   Albumin 4.4 3.8 - 4.8 g/dL   Globulin, Total 2.5 1.5 - 4.5 g/dL   Albumin/Globulin Ratio 1.8 1.2 - 2.2   Bilirubin Total 0.2 0.0 - 1.2 mg/dL   Alkaline Phosphatase 64 44 - 121 IU/L   AST 15 0 - 40 IU/L   ALT 13 0 - 32 IU/L  Vitamin D (25 hydroxy)     Status: None   Collection Time:  03/08/21  9:21 AM  Result Value Ref Range   Vit D, 25-Hydroxy 40.8 30.0 - 100.0 ng/mL    Comment: Vitamin D deficiency has been defined by the Institute of Medicine and an Endocrine Society practice guideline as a level of serum 25-OH vitamin D less than 20 ng/mL (1,2). The Endocrine Society went on to further define vitamin D insufficiency as a level between 21 and 29 ng/mL (2). 1. IOM (Institute of Medicine). 2010. Dietary reference    intakes for calcium and D. [place]: The    [employer]. 2. Holick MF, Binkley [place], Bischoff-Ferrari HA, et al.  Evaluation, treatment, and prevention of vitamin D    deficiency: an Endocrine Society clinical practice    guideline. JCEM. 2011 Jul; 96(7):1911-30.   Lipid panel     Status: None   Collection Time: 03/08/21  9:21 AM  Result Value Ref Range   Cholesterol, Total 154 100 - 199 mg/dL   Triglycerides 77 0 - 149 mg/dL   HDL 59 >39 mg/dL   VLDL Cholesterol Cal 15 5 - 40 mg/dL   LDL Chol Calc (NIH) 80 0 - 99 mg/dL   Chol/HDL Ratio 2.6 0.0 - 4.4 ratio    Comment:                                   T. Chol/HDL Ratio                                             Men  Women                               1/2 Avg.Risk  3.4    3.3                                   Avg.Risk  5.0    4.4                                2X Avg.Risk  9.6    7.1                                3X Avg.Risk 23.4   11.0   Hemoglobin A1c     Status: None   Collection Time: 03/08/21  9:21 AM  Result Value Ref Range   Hgb A1c MFr Bld 5.4 4.8 - 5.6 %    Comment:          Prediabetes: 5.7 - 6.4          Diabetes: >6.4          Glycemic control for adults with diabetes: <7.0    Est. average glucose Bld gHb Est-mCnc 108 mg/dL     Fall Risk: Fall Risk  03/27/2021 03/06/2021 01/23/2021 12/12/2020 10/22/2020  Falls in the past year? 0 0 0 0 0  Number falls in past yr: 0 0 0 0 0  Injury with Fall? 0 0 0 0 0  Risk for fall due to : No Fall Risks - - - -  Follow  up Falls prevention discussed - - Falls evaluation completed -     Functional Status Survey: Is the patient deaf or have difficulty hearing?: No Does the patient have difficulty seeing, even when wearing glasses/contacts?: No Does the patient have difficulty concentrating, remembering, or making decisions?: No Does the patient have difficulty walking or climbing stairs?: No Does the patient have difficulty dressing or bathing?: No Does the patient have difficulty doing errands alone such as visiting a doctor's office or shopping?: No   Assessment & Plan  1. Well adult exam   2. GAD (generalized anxiety disorder)  Keep follow up with  therapist and take medication  We will try switching to Pristiq if not better by next visit   3. MDD (major depressive disorder), recurrent episode, mild ([place])   4. Psychophysiological insomnia  - temazepam (RESTORIL) 15 MG capsule; Take 1 capsule (15 mg total) by mouth at bedtime as needed for sleep.  Dispense: 30 capsule; Refill: 0   -USPSTF grade A and B recommendations reviewed with patient; age-appropriate recommendations, preventive care, screening tests, etc discussed and encouraged; healthy living encouraged; see AVS for patient education given to patient -Discussed importance of 150 minutes of physical activity weekly, eat two servings of fish weekly, eat one serving of tree nuts ( cashews, pistachios, pecans, almonds.Marland Kitchen) every other day, eat 6 servings of fruit/vegetables Patricia and drink plenty of water and avoid sweet beverages.

## 2021-03-27 ENCOUNTER — Other Ambulatory Visit: Payer: Self-pay

## 2021-03-27 ENCOUNTER — Ambulatory Visit (INDEPENDENT_AMBULATORY_CARE_PROVIDER_SITE_OTHER): Payer: 59 | Admitting: Family Medicine

## 2021-03-27 ENCOUNTER — Encounter: Payer: Self-pay | Admitting: Family Medicine

## 2021-03-27 VITALS — BP 126/82 | HR 95 | Temp 98.3°F | Resp 16 | Ht 62.0 in | Wt 170.0 lb

## 2021-03-27 DIAGNOSIS — F411 Generalized anxiety disorder: Secondary | ICD-10-CM | POA: Diagnosis not present

## 2021-03-27 DIAGNOSIS — Z0000 Encounter for general adult medical examination without abnormal findings: Secondary | ICD-10-CM | POA: Diagnosis not present

## 2021-03-27 DIAGNOSIS — F330 Major depressive disorder, recurrent, mild: Secondary | ICD-10-CM

## 2021-03-27 DIAGNOSIS — F5104 Psychophysiologic insomnia: Secondary | ICD-10-CM | POA: Diagnosis not present

## 2021-03-27 MED ORDER — TEMAZEPAM 15 MG PO CAPS: 15.0000 mg | ORAL_CAPSULE | Freq: Every evening | ORAL | 0 refills | Status: DC | PRN

## 2021-04-21 ENCOUNTER — Other Ambulatory Visit: Payer: Self-pay | Admitting: Family Medicine

## 2021-04-21 DIAGNOSIS — F5104 Psychophysiologic insomnia: Secondary | ICD-10-CM

## 2021-04-24 ENCOUNTER — Ambulatory Visit: Payer: 59 | Admitting: Family Medicine

## 2021-05-07 ENCOUNTER — Other Ambulatory Visit: Payer: Self-pay | Admitting: Family Medicine

## 2021-05-07 DIAGNOSIS — F5104 Psychophysiologic insomnia: Secondary | ICD-10-CM

## 2021-05-20 ENCOUNTER — Other Ambulatory Visit: Payer: Self-pay | Admitting: Family Medicine

## 2021-05-20 DIAGNOSIS — F330 Major depressive disorder, recurrent, mild: Secondary | ICD-10-CM

## 2021-05-20 DIAGNOSIS — F411 Generalized anxiety disorder: Secondary | ICD-10-CM

## 2021-05-20 NOTE — Telephone Encounter (Signed)
Future OV 09/24/21  Approved per protocol.  Requested Prescriptions  Pending Prescriptions Disp Refills   DULoxetine (CYMBALTA) 60 MG capsule [Pharmacy Med Name: DULoxetine HCl 60 MG Oral Capsule Delayed Release Particles] 90 capsule 0    Sig: Take 1 capsule by mouth in the morning     Psychiatry: Antidepressants - SNRI Passed - 05/20/2021 12:43 PM      Passed - Completed PHQ-2 or PHQ-9 in the last 360 days      Passed - Last BP in normal range    BP Readings from Last 1 Encounters:  03/27/21 126/82         Passed - Valid encounter within last 6 months    Recent Outpatient Visits          1 month ago Well adult exam   [facility] Steele Sizer, MD   3 months ago Vaginal discharge   [place], DO   5 months ago Viral URI   [place], DO   7 months ago GAD (generalized anxiety disorder)   [facility] Steele Sizer, MD   8 months ago Periumbilical abdominal pain   [facility] Just, Laurita Quint, FNP      Future Appointments            In 4 months Steele Sizer, MD [facility], [facility]

## 2021-06-17 NOTE — Progress Notes (Signed)
Name: Patricia Horn   MRN: [id number]    DOB: 1991/03/03   Date:06/18/2021       Progress Note  Subjective  Chief Complaint  Depression/Sleep  HPI  Depression/ Major GAD:  She is taking Duloxetine and seeing [name] therapist once a month. 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 She has been applying for multiple positions since Summer 2022 , only had three interviews offered, but a lot of hiring freezes and is feeling discouraged and worried. She has difficulty falling asleep at times, lack of motivation, difficulty with focus at work, her performance at work has not been good - procrastinating more. Lack of joy. Not going to the gym like she used to . She was also in a relationship with a married person - they were friends in the past for 15 years. She blocked him from all contacts and social media and now she misses him.     Allergies: she is now under the care of Dr. Richardson Landry, started allergy shots Summer 2021 and seems to be helping with symptoms. Unchanged    Patient Active Problem List   Diagnosis Date Noted   Vaginal discharge 01/23/2021   MDD (major depressive disorder), recurrent episode, mild ([place]) 10/22/2020   Environmental allergies 04/19/2020   Allergy to cats 04/19/2020   Uterine leiomyoma 03/02/2019   Overweight (BMI 25.0-29.9) 03/02/2019   Bilateral hand pain 12/28/2017   Right carpal tunnel syndrome 11/10/2016   Urticaria 85/88/5027   Umbilical cyst 74/04/8785   Perennial allergic rhinitis 11/16/2014   GAD (generalized anxiety disorder) 10/06/2014   ANA positive 10/06/2014    Past Surgical History:  Procedure Laterality Date   HERNIA REPAIR  7672   umbilical    MYOMECTOMY  2015    Family History  Problem Relation Age of Onset   Hypertension Mother    Scleroderma Mother    Pulmonary fibrosis Mother    Asthma Mother    Cancer Neg Hx     Social  History   Tobacco Use   Smoking status: Never   Smokeless tobacco: Never  Substance Use Topics   Alcohol use: Yes    Alcohol/week: 1.0 standard drink    Types: 1 Standard drinks or equivalent per week    Comment: ocassional     Current Outpatient Medications:    DULoxetine (CYMBALTA) 30 MG capsule, Take 3 capsules (90 mg total) by mouth daily., Disp: 90 capsule, Rfl: 1   DULoxetine (CYMBALTA) 60 MG capsule, Take 1 capsule by mouth in the morning, Disp: 90 capsule, Rfl: 0   hydrOXYzine (ATARAX/VISTARIL) 10 MG tablet, Take 1-2 tablets (10-20 mg total) by mouth 3 (three) times daily as needed., Disp: 90 tablet, Rfl: 2   LARIN FE 1/20 1-20 MG-MCG tablet, Take 1 tablet by mouth once daily, Disp: 84 tablet, Rfl: 3   temazepam (RESTORIL) 15 MG capsule, TAKE 1 CAPSULE BY MOUTH AT BEDTIME AS NEEDED FOR SLEEP, Disp: 30 capsule, Rfl: 0  Allergies  Allergen Reactions   Peanut Oil Swelling    Eyelids swell.   Peanut-Containing Drug Products     I personally reviewed active problem list, medication list, allergies, family history, social history, health maintenance with the patient/caregiver today.   ROS  Ten systems reviewed and is negative except as mentioned in HPI   Objective  Vitals:   06/18/21 1140  BP: 116/72  Pulse:  85  Resp: 16  Temp: 98.2 F (36.8 C)  TempSrc: Oral  SpO2: 99%  Weight: 170 lb 3.2 oz (77.2 kg)  Height: 5\' 2"  (1.575 m)    Body mass index is 31.13 kg/m.  Physical Exam  Constitutional: Patient appears well-developed and well-nourished. Obese  No distress.  HEENT: head atraumatic, normocephalic, pupils equal and reactive to light,  neck supple Cardiovascular: Normal rate, regular rhythm and normal heart sounds.  No murmur heard. No BLE edema. Pulmonary/Chest: Effort normal and breath sounds normal. No respiratory distress. Abdominal: Soft.  There is no tenderness. Psychiatric: Patient has a normal mood and affect. behavior is normal. Judgment and  thought content normal.   PHQ2/9: Depression screen [facility] 2/9 06/18/2021 03/27/2021 03/06/2021 03/06/2021 01/23/2021  Decreased Interest 2 1 0 0 0  Down, Depressed, Hopeless 2 1 2  0 1  PHQ - 2 Score 4 2 2  0 1  Altered sleeping 1 3 3  - 0  Tired, decreased energy 0 2 2 - 0  Change in appetite 0 0 0 - 0  Feeling bad or failure about yourself  0 0 0 - 0  Trouble concentrating 0 0 0 - 0  Moving slowly or fidgety/restless 0 0 0 - 0  Suicidal thoughts 0 0 0 - 0  PHQ-9 Score 5 7 7  - 1  Difficult doing work/chores Somewhat difficult - Not difficult at all - Not difficult at all  Some recent data might be hidden    phq 9 is positive   Fall Risk: Fall Risk  06/18/2021 03/27/2021 03/06/2021 01/23/2021 12/12/2020  Falls in the past year? 0 0 0 0 0  Number falls in past yr: 0 0 0 0 0  Injury with Fall? 0 0 0 0 0  Risk for fall due to : No Fall Risks No Fall Risks - - -  Follow up Falls prevention discussed Falls prevention discussed - - Falls evaluation completed      Functional Status Survey: Is the patient deaf or have difficulty hearing?: No Does the patient have difficulty seeing, even when wearing glasses/contacts?: No Does the patient have difficulty concentrating, remembering, or making decisions?: No Does the patient have difficulty walking or climbing stairs?: No Does the patient have difficulty dressing or bathing?: No Does the patient have difficulty doing errands alone such as visiting a doctor's office or shopping?: No    Assessment & Plan  1. MDD (major depressive disorder), recurrent episode, mild (HCC)  - DULoxetine (CYMBALTA) 30 MG capsule; Take 3 capsules (90 mg total) by mouth daily.  Dispense: 90 capsule; Refill: 1 - DULoxetine (CYMBALTA) 60 MG capsule; Take 1 capsule (60 mg total) by mouth every morning.  Dispense: 90 capsule; Refill: 1 - ARIPiprazole (ABILIFY) 2 MG tablet; Take 1 tablet (2 mg total) by mouth every evening.  Dispense: 30 tablet; Refill: 1 -  lisdexamfetamine (VYVANSE) 20 MG capsule; Take 1 capsule (20 mg total) by mouth daily before breakfast. Start 2 weeks after aripiprazole 2 mg  Dispense: 30 capsule; Refill: 0  2. GAD (generalized anxiety disorder)  - DULoxetine (CYMBALTA) 30 MG capsule; Take 3 capsules (90 mg total) by mouth daily.  Dispense: 90 capsule; Refill: 1 - DULoxetine (CYMBALTA) 60 MG capsule; Take 1 capsule (60 mg total) by mouth every morning.  Dispense: 90 capsule; Refill: 1  3. Psychophysiological insomnia  Hopefully Abilify will also help with sleep

## 2021-06-18 ENCOUNTER — Ambulatory Visit: Payer: 59 | Admitting: Family Medicine

## 2021-06-18 ENCOUNTER — Encounter: Payer: Self-pay | Admitting: Family Medicine

## 2021-06-18 VITALS — BP 116/72 | HR 85 | Temp 98.2°F | Resp 16 | Ht 62.0 in | Wt 170.2 lb

## 2021-06-18 DIAGNOSIS — F411 Generalized anxiety disorder: Secondary | ICD-10-CM | POA: Diagnosis not present

## 2021-06-18 DIAGNOSIS — F330 Major depressive disorder, recurrent, mild: Secondary | ICD-10-CM

## 2021-06-18 DIAGNOSIS — F5104 Psychophysiologic insomnia: Secondary | ICD-10-CM

## 2021-06-18 MED ORDER — ARIPIPRAZOLE 2 MG PO TABS: 2.0000 mg | ORAL_TABLET | Freq: Every evening | ORAL | 1 refills | Status: DC

## 2021-06-18 MED ORDER — DULOXETINE HCL 30 MG PO CPEP: 90.0000 mg | ORAL_CAPSULE | Freq: Every day | ORAL | 1 refills | Status: DC

## 2021-06-18 MED ORDER — DULOXETINE HCL 60 MG PO CPEP: 60.0000 mg | ORAL_CAPSULE | Freq: Every morning | ORAL | 1 refills | Status: DC

## 2021-06-18 MED ORDER — LISDEXAMFETAMINE DIMESYLATE 20 MG PO CAPS: 20.0000 mg | ORAL_CAPSULE | Freq: Every day | ORAL | 0 refills | Status: DC

## 2021-06-21 ENCOUNTER — Encounter: Payer: Self-pay | Admitting: Family Medicine

## 2021-07-22 NOTE — Progress Notes (Signed)
Name: Patricia Horn   MRN: [id number]    DOB: 1991/01/21   Date:07/23/2021 ? ?     Progress Note ? ?Subjective ? ?Chief Complaint ? ?Follow Up ? ?HPI ? ?Depression/ Major GAD:  Patricia Horn came in for a visit one month ago , she was still seeing her therapist , Patricia Horn , once a month. 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 She was having  difficulty falling asleep at times, lack of motivation, difficulty with focus at work and lack of joy. We adjusted her medications, she is now on Duloxetine 90 mg, we added Vyvanse 20 mg and Abilify at night. She states she is feeling much better, she has more motivation, going to the gym on a regular basis, not feeling as sad. She has applied for multiple jobs and is still worried about the future but overall feels medication has been very helpful. She has noticed some problems sleeping, explained she needs to take Vyvanse very early and may resume Melatonin with hydroxizine at night.  ? ?Patient Active Problem List  ? Diagnosis Date Noted  ? MDD (major depressive disorder), recurrent episode, mild ([place]) 10/22/2020  ? Environmental allergies 04/19/2020  ? Allergy to cats 04/19/2020  ? Uterine leiomyoma 03/02/2019  ? Overweight (BMI 25.0-29.9) 03/02/2019  ? Bilateral hand pain 12/28/2017  ? Right carpal tunnel syndrome 11/10/2016  ? Urticaria 11/09/2015  ? Umbilical cyst 23/55/7322  ? Perennial allergic rhinitis 11/16/2014  ? GAD (generalized anxiety disorder) 10/06/2014  ? ANA positive 10/06/2014  ? ? ?Past Surgical History:  ?Procedure Laterality Date  ? HERNIA REPAIR  0254  ? umbilical   ? MYOMECTOMY  2015  ? ? ?Family History  ?Problem Relation Age of Onset  ? Hypertension Mother   ? Scleroderma Mother   ? Pulmonary fibrosis Mother   ? Asthma Mother   ? Cancer Neg Hx   ? ? ?Social History  ? ?Tobacco Use  ? Smoking status: Never  ? Smokeless tobacco: Never   ?Substance Use Topics  ? Alcohol use: Yes  ?  Alcohol/week: 1.0 standard drink  ?  Types: 1 Standard drinks or equivalent per week  ?  Comment: ocassional  ? ? ? ?Current Outpatient Medications:  ?  hydrOXYzine (ATARAX/VISTARIL) 10 MG tablet, Take 1-2 tablets (10-20 mg total) by mouth 3 (three) times daily as needed., Disp: 90 tablet, Rfl: 2 ?  LARIN FE 1/20 1-20 MG-MCG tablet, Take 1 tablet by mouth once daily, Disp: 84 tablet, Rfl: 3 ?  lisdexamfetamine (VYVANSE) 20 MG capsule, Take 1 capsule (20 mg total) by mouth daily., Disp: 30 capsule, Rfl: 0 ?  Melatonin 10 MG TABS, Take 1 tablet by mouth at bedtime., Disp: , Rfl:  ?  ARIPiprazole (ABILIFY) 2 MG tablet, Take 1 tablet (2 mg total) by mouth every evening., Disp: 90 tablet, Rfl: 0 ?  DULoxetine (CYMBALTA) 30 MG capsule, Take 3 capsules (90 mg total) by mouth daily., Disp: 270 capsule, Rfl: 0 ?  lisdexamfetamine (VYVANSE) 20 MG capsule, Take 1 capsule (20 mg total) by mouth daily before breakfast., Disp: 30 capsule, Rfl: 0 ? ?Allergies  ?Allergen Reactions  ? Peanut Oil Swelling  ?  Eyelids swell.  ? Peanut-Containing Drug Products   ? ? ?I personally reviewed active problem list, medication list, allergies, family history, social history with the patient/caregiver today. ? ? ?ROS ? ?  Ten systems reviewed and is negative except as mentioned in HPI  ? ?Objective ? ?Vitals:  ? 07/23/21 1045  ?BP: 110/76  ?Pulse: (!) 109  ?Resp: 16  ?SpO2: 97%  ?Weight: 173 lb (78.5 kg)  ?Height: '5\' 2"'$  (1.575 m)  ? ? ?Body mass index is 31.64 kg/m?. ? ?Physical Exam ? ?Constitutional: Patient appears well-developed and well-nourished. Obese  No distress.  ?HEENT: head atraumatic, normocephalic, pupils equal and reactive to light, neck supple ?Cardiovascular: Normal rate, regular rhythm and normal heart sounds.  No murmur heard. No BLE edema. ?Pulmonary/Chest: Effort normal and breath sounds normal. No respiratory distress. ?Abdominal: Soft.  There is no tenderness. ?Psychiatric:  Patient has a normal mood and affect. behavior is normal. Judgment and thought content normal.  ? ? ?PHQ2/9: ?Depression screen [facility] 2/9 07/23/2021 06/18/2021 03/27/2021 03/06/2021 03/06/2021  ?Decreased Interest 0 2 1 0 0  ?Down, Depressed, Hopeless 0 '2 1 2 '$ 0  ?PHQ - 2 Score 0 '4 2 2 '$ 0  ?Altered sleeping '2 1 3 3 '$ -  ?Tired, decreased energy 0 0 2 2 -  ?Change in appetite 0 0 0 0 -  ?Feeling bad or failure about yourself  0 0 0 0 -  ?Trouble concentrating 3 0 0 0 -  ?Moving slowly or fidgety/restless 0 0 0 0 -  ?Suicidal thoughts 0 0 0 0 -  ?PHQ-9 Score '5 5 7 7 '$ -  ?Difficult doing work/chores - Somewhat difficult - Not difficult at all -  ?Some recent data might be hidden  ?  ?phq 9 is positive ? ? ?Fall Risk: ?Fall Risk  07/23/2021 06/18/2021 03/27/2021 03/06/2021 01/23/2021  ?Falls in the past year? 0 0 0 0 0  ?Number falls in past yr: 0 0 0 0 0  ?Injury with Fall? 0 0 0 0 0  ?Risk for fall due to : No Fall Risks No Fall Risks No Fall Risks - -  ?Follow up Falls prevention discussed Falls prevention discussed Falls prevention discussed - -  ? ? ? ? ?Functional Status Survey: ?Is the patient deaf or have difficulty hearing?: No ?Does the patient have difficulty seeing, even when wearing glasses/contacts?: No ?Does the patient have difficulty concentrating, remembering, or making decisions?: Yes ?Does the patient have difficulty walking or climbing stairs?: No ?Does the patient have difficulty dressing or bathing?: No ?Does the patient have difficulty doing errands alone such as visiting a doctor's office or shopping?: No ? ? ? ?Assessment & Plan ? ? ?1. MDD (major depressive disorder), recurrent episode, mild ([place]) ? ?- lisdexamfetamine (VYVANSE) 20 MG capsule; Take 1 capsule (20 mg total) by mouth daily before breakfast.  Dispense: 30 capsule; Refill: 0 ?- lisdexamfetamine (VYVANSE) 20 MG capsule; Take 1 capsule (20 mg total) by mouth daily.  Dispense: 30 capsule; Refill: 0 ?- DULoxetine (CYMBALTA) 30 MG capsule; Take 3  capsules (90 mg total) by mouth daily.  Dispense: 270 capsule; Refill: 0 ?- ARIPiprazole (ABILIFY) 2 MG tablet; Take 1 tablet (2 mg total) by mouth every evening.  Dispense: 90 tablet; Refill: 0 ? ?2. GAD (generalized anxiety disorder) ? ?- DULoxetine (CYMBALTA) 30 MG capsule; Take 3 capsules (90 mg total) by mouth daily.  Dispense: 270 capsule; Refill: 0  ?

## 2021-07-23 ENCOUNTER — Other Ambulatory Visit: Payer: Self-pay

## 2021-07-23 ENCOUNTER — Encounter: Payer: Self-pay | Admitting: Family Medicine

## 2021-07-23 ENCOUNTER — Ambulatory Visit: Payer: 59 | Admitting: Family Medicine

## 2021-07-23 ENCOUNTER — Other Ambulatory Visit: Payer: Self-pay | Admitting: Family Medicine

## 2021-07-23 DIAGNOSIS — F330 Major depressive disorder, recurrent, mild: Secondary | ICD-10-CM

## 2021-07-23 DIAGNOSIS — F411 Generalized anxiety disorder: Secondary | ICD-10-CM | POA: Diagnosis not present

## 2021-07-23 MED ORDER — LISDEXAMFETAMINE DIMESYLATE 20 MG PO CAPS: 20.0000 mg | ORAL_CAPSULE | Freq: Every day | ORAL | 0 refills | Status: DC

## 2021-07-23 MED ORDER — DULOXETINE HCL 30 MG PO CPEP: 90.0000 mg | ORAL_CAPSULE | Freq: Every day | ORAL | 0 refills | Status: DC

## 2021-07-23 MED ORDER — MODAFINIL 100 MG PO TABS: 100.0000 mg | ORAL_TABLET | Freq: Every day | ORAL | 0 refills | Status: DC

## 2021-07-23 MED ORDER — ARIPIPRAZOLE 2 MG PO TABS: 2.0000 mg | ORAL_TABLET | Freq: Every evening | ORAL | 0 refills | Status: DC

## 2021-09-23 NOTE — Progress Notes (Unsigned)
Name: Patricia Horn   MRN: [id number]    DOB: [birth year]   Date:09/24/2021       Progress Note  Subjective  Chief Complaint  Follow Up  HPI  Depression/ Major GAD:  She is taking Duloxetine and seeing [name] therapist once a month. She states currently not doing well because of winter months and also holidays. She states suicidal thoughts not as frequent, but has lack of motivation, feels sad . She states not feeling well over the past 2 weeks, she had an [employer] interview and was very hopeful about getting the position and after a 4 hour interview she was told she did not get the position. She also sad because of mother's day and missing her mother. Her current job will end on June 30 th, 2023. We added Modafinil but did not help with focus or energy. She is taking 90 mg of Duloxetine and 2 mg of Abilify at night    Insomnia: she has noticed difficulty staying asleep, it has been going on for the past year, since her mother died 06-15-18. She is taking half dose of Trazodone and Melatonin gummies. It was working, helping her fall and stay asleep however lately noticing that she has been waking up during the night and unable to fall back asleep, also has weird dreams and felt  groggy when she takes a higher dose of medication. Currently on on Hydroxizine at night, takes about half an hour to one hour to fall asleep, advised to take 20 mg to fall asleep    Allergies: she is now under the care of Dr. Richardson Landry, started allergy shots Summer 2021 and seems to be helping with symptoms. She goes weekly for her shots  Patient Active Problem List   Diagnosis Date Noted   MDD (major depressive disorder), recurrent episode, mild ([place]) 10/22/2020   Environmental allergies 04/19/2020   Allergy to cats 04/19/2020   Uterine leiomyoma 03/02/2019   Overweight (BMI 25.0-29.9) 03/02/2019   Bilateral hand pain 12/28/2017   Right carpal tunnel syndrome 11/10/2016   Urticaria 12/45/8099   Umbilical cyst  83/38/2505   Perennial allergic rhinitis 11/16/2014   GAD (generalized anxiety disorder) 10/06/2014   ANA positive 10/06/2014    Past Surgical History:  Procedure Laterality Date   HERNIA REPAIR  3976   umbilical    MYOMECTOMY  2015    Family History  Problem Relation Age of Onset   Hypertension Mother    Scleroderma Mother    Pulmonary fibrosis Mother    Asthma Mother    Cancer Neg Hx     Social History   Tobacco Use   Smoking status: Never   Smokeless tobacco: Never  Substance Use Topics   Alcohol use: Yes    Alcohol/week: 1.0 standard drink    Types: 1 Standard drinks or equivalent per week    Comment: ocassional     Current Outpatient Medications:    ARIPiprazole (ABILIFY) 2 MG tablet, Take 1 tablet (2 mg total) by mouth every evening., Disp: 90 tablet, Rfl: 0   DULoxetine (CYMBALTA) 30 MG capsule, Take 3 capsules (90 mg total) by mouth daily., Disp: 270 capsule, Rfl: 0   hydrOXYzine (ATARAX/VISTARIL) 10 MG tablet, Take 1-2 tablets (10-20 mg total) by mouth 3 (three) times daily as needed., Disp: 90 tablet, Rfl: 2   LARIN FE 1/20 1-20 MG-MCG tablet, Take 1 tablet by mouth once daily, Disp: 84 tablet, Rfl: 3   modafinil (PROVIGIL) 100 MG tablet, Take 1  tablet (100 mg total) by mouth daily., Disp: 90 tablet, Rfl: 0  Allergies  Allergen Reactions   Peanut Oil Swelling    Eyelids swell.   Peanut-Containing Drug Products     I personally reviewed active problem list, medication list, allergies, family history, social history, health maintenance with the patient/caregiver today.   ROS  Constitutional: Negative for fever or weight change.  Respiratory: Negative for cough and shortness of breath.   Cardiovascular: Negative for chest pain or palpitations.  Gastrointestinal: Negative for abdominal pain, no bowel changes.  Musculoskeletal: Negative for gait problem or joint swelling.  Skin: Negative for rash.  Neurological: Negative for dizziness or headache.  No  other specific complaints in a complete review of systems (except as listed in HPI above).   Objective  Vitals:   09/24/21 0850  BP: 122/82  Pulse: 74  Resp: 16  Weight: 174 lb (78.9 kg)  Height: '5\' 2"'$  (1.575 m)    Body mass index is 31.83 kg/m.  Physical Exam  Constitutional: Patient appears well-developed and well-nourished. Obese  No distress.  HEENT: head atraumatic, normocephalic, pupils equal and reactive to light, neck supple Cardiovascular: Normal rate, regular rhythm and normal heart sounds.  No murmur heard. No BLE edema. Pulmonary/Chest: Effort normal and breath sounds normal. No respiratory distress. Abdominal: Soft.  There is no tenderness. Psychiatric: Patient has a normal mood and affect. behavior is normal. Judgment and thought content normal.   PHQ2/9:    09/24/2021    8:49 AM 07/23/2021   10:45 AM 06/18/2021   11:33 AM 03/27/2021    9:01 AM 03/06/2021    9:14 AM  Depression screen PHQ 2/9  Decreased Interest 1 0 2 1 0  Down, Depressed, Hopeless 1 0 '2 1 2  '$ PHQ - 2 Score 2 0 '4 2 2  '$ Altered sleeping '3 2 1 3 3  '$ Tired, decreased energy 3 0 0 2 2  Change in appetite 1 0 0 0 0  Feeling bad or failure about yourself  1 0 0 0 0  Trouble concentrating 1 3 0 0 0  Moving slowly or fidgety/restless 0 0 0 0 0  Suicidal thoughts 1 0 0 0 0  PHQ-9 Score '12 5 5 7 7  '$ Difficult doing work/chores   Somewhat difficult  Not difficult at all    phq 9 is positive   Fall Risk:    09/24/2021    8:49 AM 07/23/2021   10:41 AM 06/18/2021   11:32 AM 03/27/2021    9:00 AM 03/06/2021    9:13 AM  Fall Risk   Falls in the past year? 0 0 0 0 0  Number falls in past yr: 0 0 0 0 0  Injury with Fall? 0 0 0 0 0  Risk for fall due to : No Fall Risks No Fall Risks No Fall Risks No Fall Risks   Follow up Falls prevention discussed Falls prevention discussed Falls prevention discussed Falls prevention discussed       Functional Status Survey: Is the patient deaf or have difficulty  hearing?: No Does the patient have difficulty seeing, even when wearing glasses/contacts?: No Does the patient have difficulty concentrating, remembering, or making decisions?: Yes Does the patient have difficulty walking or climbing stairs?: No Does the patient have difficulty dressing or bathing?: No Does the patient have difficulty doing errands alone such as visiting a doctor's office or shopping?: No    Assessment & Plan  Problem List Items Addressed This  Visit     GAD (generalized anxiety disorder)    Increase hydroxizine to two at night if needed        Relevant Medications   DULoxetine (CYMBALTA) 30 MG capsule   hydrOXYzine (ATARAX) 10 MG tablet   MDD (major depressive disorder), recurrent episode, moderate (HCC)    Continue Duloxetine and Abilify  Add ADderal for energy since modafinil did not work        Relevant Medications   amphetamine-dextroamphetamine (ADDERALL) 10 MG tablet   ARIPiprazole (ABILIFY) 2 MG tablet   DULoxetine (CYMBALTA) 30 MG capsule   hydrOXYzine (ATARAX) 10 MG tablet   Other Visit Diagnoses     Psychophysiological insomnia       Relevant Medications   hydrOXYzine (ATARAX) 10 MG tablet

## 2021-09-24 ENCOUNTER — Ambulatory Visit: Payer: 59 | Admitting: Family Medicine

## 2021-09-24 ENCOUNTER — Encounter: Payer: Self-pay | Admitting: Family Medicine

## 2021-09-24 DIAGNOSIS — F5104 Psychophysiologic insomnia: Secondary | ICD-10-CM

## 2021-09-24 DIAGNOSIS — F411 Generalized anxiety disorder: Secondary | ICD-10-CM

## 2021-09-24 DIAGNOSIS — F331 Major depressive disorder, recurrent, moderate: Secondary | ICD-10-CM

## 2021-09-24 MED ORDER — HYDROXYZINE HCL 10 MG PO TABS: 10.0000 mg | ORAL_TABLET | ORAL | 2 refills | Status: DC | PRN

## 2021-09-24 MED ORDER — AMPHETAMINE-DEXTROAMPHETAMINE 10 MG PO TABS: 10.0000 mg | ORAL_TABLET | Freq: Two times a day (BID) | ORAL | 0 refills | Status: DC

## 2021-09-24 MED ORDER — ARIPIPRAZOLE 2 MG PO TABS: 2.0000 mg | ORAL_TABLET | Freq: Every evening | ORAL | 0 refills | Status: DC

## 2021-09-24 MED ORDER — DULOXETINE HCL 30 MG PO CPEP: 90.0000 mg | ORAL_CAPSULE | Freq: Every day | ORAL | 0 refills | Status: DC

## 2021-09-24 NOTE — Assessment & Plan Note (Signed)
Increase hydroxizine to two at night if needed

## 2021-09-24 NOTE — Assessment & Plan Note (Signed)
Continue Duloxetine and Abilify  Add ADderal for energy since modafinil did not work

## 2021-11-14 NOTE — Progress Notes (Deleted)
Name: Patricia Horn   MRN: [id number]    DOB: [birth year]   Date:11/14/2021       Progress Note  Subjective  Chief Complaint  Follow Up  HPI  Depression/ Major GAD:  She is taking Duloxetine and seeing [name] therapist once a month. She states currently not doing well because of winter months and also holidays. She states suicidal thoughts not as frequent, but has lack of motivation, feels sad . She states not feeling well over the past 2 weeks, she had an [employer] interview and was very hopeful about getting the position and after a 4 hour interview she was told she did not get the position. She also sad because of mother's day and missing her mother. Her current job will end on June 30 th, 2023. We added Modafinil but did not help with focus or energy. She is taking 90 mg of Duloxetine and 2 mg of Abilify at night    Insomnia: she has noticed difficulty staying asleep, it has been going on for the past year, since her mother died 07-Jun-2018. She is taking half dose of Trazodone and Melatonin gummies. It was working, helping her fall and stay asleep however lately noticing that she has been waking up during the night and unable to fall back asleep, also has weird dreams and felt  groggy when she takes a higher dose of medication. Currently on on Hydroxizine at night, takes about half an hour to one hour to fall asleep, advised to take 20 mg to fall asleep    Allergies: she is now under the care of Dr. Richardson Landry, started allergy shots Summer 2021 and seems to be helping with symptoms. She goes weekly for her shots  Patient Active Problem List   Diagnosis Date Noted   MDD (major depressive disorder), recurrent episode, moderate ([place]) 10/22/2020   Environmental allergies 04/19/2020   Allergy to cats 04/19/2020   Uterine leiomyoma 03/02/2019   Overweight (BMI 25.0-29.9) 03/02/2019   Bilateral hand pain 12/28/2017   Right carpal tunnel syndrome 11/10/2016   Urticaria 54/65/0354   Umbilical cyst  65/68/1275   Perennial allergic rhinitis 11/16/2014   GAD (generalized anxiety disorder) 10/06/2014   ANA positive 10/06/2014    Past Surgical History:  Procedure Laterality Date   HERNIA REPAIR  1700   umbilical    MYOMECTOMY  2015    Family History  Problem Relation Age of Onset   Hypertension Mother    Scleroderma Mother    Pulmonary fibrosis Mother    Asthma Mother    Cancer Neg Hx     Social History   Tobacco Use   Smoking status: Never   Smokeless tobacco: Never  Substance Use Topics   Alcohol use: Yes    Alcohol/week: 1.0 standard drink of alcohol    Types: 1 Standard drinks or equivalent per week    Comment: ocassional     Current Outpatient Medications:    amphetamine-dextroamphetamine (ADDERALL) 10 MG tablet, Take 1 tablet (10 mg total) by mouth 2 (two) times daily., Disp: 60 tablet, Rfl: 0   ARIPiprazole (ABILIFY) 2 MG tablet, Take 1 tablet (2 mg total) by mouth every evening., Disp: 90 tablet, Rfl: 0   DULoxetine (CYMBALTA) 30 MG capsule, Take 3 capsules (90 mg total) by mouth daily., Disp: 270 capsule, Rfl: 0   hydrOXYzine (ATARAX) 10 MG tablet, Take 1-2 tablets (10-20 mg total) by mouth every 4 (four) hours as needed., Disp: 120 tablet, Rfl: 2   LARIN  FE 1/20 1-20 MG-MCG tablet, Take 1 tablet by mouth once daily, Disp: 84 tablet, Rfl: 3  Allergies  Allergen Reactions   Peanut Oil Swelling    Eyelids swell.   Peanut-Containing Drug Products     I personally reviewed active problem list, medication list, allergies, family history, social history, health maintenance with the patient/caregiver today.   ROS  ***  Objective  There were no vitals filed for this visit.  There is no height or weight on file to calculate BMI.  Physical Exam ***  No results found for this or any previous visit (from the past 2160 hour(s)).   PHQ2/9:    09/24/2021    8:49 AM 07/23/2021   10:45 AM 06/18/2021   11:33 AM 03/27/2021    9:01 AM 03/06/2021    9:14 AM   Depression screen PHQ 2/9  Decreased Interest 1 0 2 1 0  Down, Depressed, Hopeless 1 0 '2 1 2  '$ PHQ - 2 Score 2 0 '4 2 2  '$ Altered sleeping '3 2 1 3 3  '$ Tired, decreased energy 3 0 0 2 2  Change in appetite 1 0 0 0 0  Feeling bad or failure about yourself  1 0 0 0 0  Trouble concentrating 1 3 0 0 0  Moving slowly or fidgety/restless 0 0 0 0 0  Suicidal thoughts 1 0 0 0 0  PHQ-9 Score '12 5 5 7 7  '$ Difficult doing work/chores   Somewhat difficult  Not difficult at all    phq 9 is {gen pos EXB:284132}   Fall Risk:    09/24/2021    8:49 AM 07/23/2021   10:41 AM 06/18/2021   11:32 AM 03/27/2021    9:00 AM 03/06/2021    9:13 AM  Fall Risk   Falls in the past year? 0 0 0 0 0  Number falls in past yr: 0 0 0 0 0  Injury with Fall? 0 0 0 0 0  Risk for fall due to : No Fall Risks No Fall Risks No Fall Risks No Fall Risks   Follow up Falls prevention discussed Falls prevention discussed Falls prevention discussed Falls prevention discussed       Functional Status Survey:      Assessment & Plan  *** There are no diagnoses linked to this encounter.

## 2021-11-15 ENCOUNTER — Encounter: Payer: Self-pay | Admitting: Family Medicine

## 2021-11-18 ENCOUNTER — Ambulatory Visit: Payer: 59 | Admitting: Family Medicine

## 2021-11-26 ENCOUNTER — Other Ambulatory Visit: Payer: Self-pay | Admitting: Obstetrics and Gynecology

## 2021-12-20 NOTE — Progress Notes (Unsigned)
Name: Patricia Horn   MRN: [id number]    DOB: [birth year]   Date:12/23/2021       Progress Note  Subjective  Chief Complaint  Medication Refill  HPI  Depression/ Major GAD:  She is taking Duloxetine and seeing [name] therapist once a month. She states currently not doing well because of winter months and also holidays. She states suicidal thoughts not as frequent, but has lack of motivation, feels sad . She states not feeling well over the past 2 weeks, she had an [employer] interview and was very hopeful about getting the position and after a 4 hour interview she was told she did not get the position. She also sad because of mother's day and missing her mother. Her current job will end on June 30 th, 2023. We added Modafinil but did not help with focus or energy. She is taking 90 mg of Duloxetine and 2 mg of Abilify at night , out of Adderal , we will try going back on Vyvanse since Adderal did not work as well for her    Insomnia: she has noticed difficulty staying asleep, it has been going on for the past year, since her mother died 07-Jun-2018. She is taking half dose of Trazodone and Melatonin gummies. It was working, helping her fall and stay asleep however lately noticing that she has been waking up during the night and unable to fall back asleep, also has weird dreams and felt  groggy when she takes a higher dose of medication. Currently on on Hydroxizine at night, she was sleeping well but lately under more stress, sold her house, moving to an apartment since too expensive to buy a house now    Allergies: she is now under the care of Dr. Richardson Landry, started allergy shots Summer 2021 and seems to be helping with symptoms. She had a gap on insurance and is not currently getting the shots, but will go back, she has noticed increase of sinus pressure since out of the shots   Pelvic pain: intermittent dull ache on right pelvic area. She has a visit with gyn on August 30 th, 2023   Patient Active Problem  List   Diagnosis Date Noted   MDD (major depressive disorder), recurrent episode, moderate ([place]) 10/22/2020   Environmental allergies 04/19/2020   Allergy to cats 04/19/2020   Uterine leiomyoma 03/02/2019   Overweight (BMI 25.0-29.9) 03/02/2019   Bilateral hand pain 12/28/2017   Right carpal tunnel syndrome 11/10/2016   Urticaria 67/89/3810   Umbilical cyst 17/51/0258   Perennial allergic rhinitis 11/16/2014   GAD (generalized anxiety disorder) 10/06/2014   ANA positive 10/06/2014    Past Surgical History:  Procedure Laterality Date   HERNIA REPAIR  5277   umbilical    MYOMECTOMY  2015    Family History  Problem Relation Age of Onset   Hypertension Mother    Scleroderma Mother    Pulmonary fibrosis Mother    Asthma Mother    Cancer Neg Hx     Social History   Tobacco Use   Smoking status: Never   Smokeless tobacco: Never  Substance Use Topics   Alcohol use: Yes    Alcohol/week: 1.0 standard drink of alcohol    Types: 1 Standard drinks or equivalent per week    Comment: ocassional     Current Outpatient Medications:    amphetamine-dextroamphetamine (ADDERALL) 10 MG tablet, Take 1 tablet (10 mg total) by mouth 2 (two) times daily., Disp: 60 tablet, Rfl: 0  ARIPiprazole (ABILIFY) 2 MG tablet, Take 1 tablet (2 mg total) by mouth every evening., Disp: 90 tablet, Rfl: 0   DULoxetine (CYMBALTA) 30 MG capsule, Take 3 capsules (90 mg total) by mouth daily., Disp: 270 capsule, Rfl: 0   hydrOXYzine (ATARAX) 10 MG tablet, Take 1-2 tablets (10-20 mg total) by mouth every 4 (four) hours as needed., Disp: 120 tablet, Rfl: 2   LARIN FE 1/20 1-20 MG-MCG tablet, Take 1 tablet by mouth once daily, Disp: 84 tablet, Rfl: 2  Allergies  Allergen Reactions   Peanut Oil Swelling    Eyelids swell.   Peanut-Containing Drug Products     I personally reviewed active problem list, medication list, allergies, family history, social history, health maintenance with the patient/caregiver  today.   ROS  Constitutional: Negative for fever or weight change.  Respiratory: Negative for cough and shortness of breath.   Cardiovascular: Negative for chest pain or palpitations.  Gastrointestinal: Negative for abdominal pain, no bowel changes.  Musculoskeletal: Negative for gait problem or joint swelling.  Skin: Negative for rash.  Neurological: Negative for dizziness or headache.  No other specific complaints in a complete review of systems (except as listed in HPI above).   Objective  Vitals:   12/23/21 0942  BP: 124/82  Pulse: 92  Resp: 16  SpO2: 98%  Weight: 187 lb (84.8 kg)  Height: '5\' 2"'$  (1.575 m)    Body mass index is 34.2 kg/m.  Physical Exam  Constitutional: Patient appears well-developed and well-nourished. Obese  No distress.  HEENT: head atraumatic, normocephalic, pupils equal and reactive to light, neck supple Cardiovascular: Normal rate, regular rhythm and normal heart sounds.  No murmur heard. No BLE edema. Pulmonary/Chest: Effort normal and breath sounds normal. No respiratory distress. Abdominal: Soft.  There is no tenderness. Psychiatric: Patient has a normal mood and affect. behavior is normal. Judgment and thought content normal.   PHQ2/9:    12/23/2021    9:42 AM 09/24/2021    8:49 AM 07/23/2021   10:45 AM 06/18/2021   11:33 AM 03/27/2021    9:01 AM  Depression screen PHQ 2/9  Decreased Interest 1 1 0 2 1  Down, Depressed, Hopeless 0 1 0 2 1  PHQ - 2 Score 1 2 0 4 2  Altered sleeping '1 3 2 1 3  '$ Tired, decreased energy 3 3 0 0 2  Change in appetite 1 1 0 0 0  Feeling bad or failure about yourself  0 1 0 0 0  Trouble concentrating '3 1 3 '$ 0 0  Moving slowly or fidgety/restless 0 0 0 0 0  Suicidal thoughts 0 1 0 0 0  PHQ-9 Score '9 12 5 5 7  '$ Difficult doing work/chores    Somewhat difficult     phq 9 is positive   Fall Risk:    12/23/2021    9:42 AM 09/24/2021    8:49 AM 07/23/2021   10:41 AM 06/18/2021   11:32 AM 03/27/2021    9:00  AM  Fall Risk   Falls in the past year? 0 0 0 0 0  Number falls in past yr: 0 0 0 0 0  Injury with Fall? 0 0 0 0 0  Risk for fall due to : No Fall Risks No Fall Risks No Fall Risks No Fall Risks No Fall Risks  Follow up Falls prevention discussed Falls prevention discussed Falls prevention discussed Falls prevention discussed Falls prevention discussed      Functional Status Survey:  Is the patient deaf or have difficulty hearing?: No Does the patient have difficulty seeing, even when wearing glasses/contacts?: No Does the patient have difficulty concentrating, remembering, or making decisions?: Yes Does the patient have difficulty walking or climbing stairs?: No Does the patient have difficulty dressing or bathing?: No Does the patient have difficulty doing errands alone such as visiting a doctor's office or shopping?: No    Assessment & Plan  1. MDD (major depressive disorder), recurrent episode, moderate ([facility])  - lisdexamfetamine (VYVANSE) 20 MG capsule; Take 1 capsule (20 mg total) by mouth daily.  Dispense: 30 capsule; Refill: 0 - ARIPiprazole (ABILIFY) 2 MG tablet; Take 1 tablet (2 mg total) by mouth every evening.  Dispense: 90 tablet; Refill: 0 - DULoxetine (CYMBALTA) 60 MG capsule; Take 1 capsule (60 mg total) by mouth daily.  Dispense: 90 capsule; Refill: 1 - DULoxetine (CYMBALTA) 30 MG capsule; Take 1 capsule (30 mg total) by mouth daily.  Dispense: 90 capsule; Refill: 1  2. GAD (generalized anxiety disorder)  - DULoxetine (CYMBALTA) 60 MG capsule; Take 1 capsule (60 mg total) by mouth daily.  Dispense: 90 capsule; Refill: 1 - DULoxetine (CYMBALTA) 30 MG capsule; Take 1 capsule (30 mg total) by mouth daily.  Dispense: 90 capsule; Refill: 1  3. Perennial allergic rhinitis   4. Pelvic pain in female  - US Pelvis Complete; Future - Cervicovaginal ancillary only

## 2021-12-23 ENCOUNTER — Ambulatory Visit: Payer: BC Managed Care – PPO | Admitting: Family Medicine

## 2021-12-23 ENCOUNTER — Other Ambulatory Visit (HOSPITAL_COMMUNITY)
Admission: RE | Admit: 2021-12-23 | Discharge: 2021-12-23 | Disposition: A | Discharge status: Home / Self Care | Payer: BC Managed Care – PPO | Source: Ambulatory Visit | Attending: Family Medicine | Admitting: Family Medicine

## 2021-12-23 ENCOUNTER — Encounter: Payer: Self-pay | Admitting: Family Medicine

## 2021-12-23 VITALS — BP 124/82 | HR 92 | Resp 16 | Ht 62.0 in | Wt 187.0 lb

## 2021-12-23 DIAGNOSIS — R102 Pelvic and perineal pain: Secondary | ICD-10-CM | POA: Diagnosis not present

## 2021-12-23 DIAGNOSIS — J3089 Other allergic rhinitis: Secondary | ICD-10-CM | POA: Diagnosis not present

## 2021-12-23 DIAGNOSIS — F411 Generalized anxiety disorder: Secondary | ICD-10-CM | POA: Diagnosis not present

## 2021-12-23 DIAGNOSIS — F331 Major depressive disorder, recurrent, moderate: Secondary | ICD-10-CM | POA: Diagnosis not present

## 2021-12-23 MED ORDER — DULOXETINE HCL 30 MG PO CPEP: 30.0000 mg | ORAL_CAPSULE | Freq: Every day | ORAL | 1 refills | Status: DC

## 2021-12-23 MED ORDER — LISDEXAMFETAMINE DIMESYLATE 20 MG PO CAPS: 20.0000 mg | ORAL_CAPSULE | Freq: Every day | ORAL | 0 refills | Status: DC

## 2021-12-23 MED ORDER — ARIPIPRAZOLE 2 MG PO TABS: 2.0000 mg | ORAL_TABLET | Freq: Every evening | ORAL | 0 refills | Status: DC

## 2021-12-23 MED ORDER — DULOXETINE HCL 60 MG PO CPEP: 60.0000 mg | ORAL_CAPSULE | Freq: Every day | ORAL | 1 refills | Status: DC

## 2021-12-24 LAB — CERVICOVAGINAL ANCILLARY ONLY
Bacterial Vaginitis (gardnerella): NEGATIVE
Chlamydia: NEGATIVE
Comment: NEGATIVE
Comment: NEGATIVE
Comment: NEGATIVE
Comment: NORMAL
Neisseria Gonorrhea: NEGATIVE
Trichomonas: NEGATIVE

## 2022-01-01 NOTE — Progress Notes (Unsigned)
    GYNECOLOGY PROGRESS NOTE  Subjective:    Patient ID: Patricia Horn, female    DOB: 07-Mar-1991, 31 y.o.   MRN: [id number]  HPI  Patient is a 31 y.o. G0P0000 female who presents for evaluation of right ovary pain. She has a history of cyst.  {Common ambulatory SmartLinks:19316}  Review of Systems {ros; complete:30496}   Objective:   There were no vitals taken for this visit. There is no height or weight on file to calculate BMI. General appearance: {general exam:16600} Abdomen: {abdominal exam:16834} Pelvic: {pelvic exam:16852::"cervix normal in appearance","external genitalia normal","no adnexal masses or tenderness","no cervical motion tenderness","rectovaginal septum normal","uterus normal size, shape, and consistency","vagina normal without discharge"} Extremities: {extremity exam:5109} Neurologic: {neuro exam:17854}   Assessment:   No diagnosis found.   Plan:   There are no diagnoses linked to this encounter.   Rubie Maid, MD [facility]

## 2022-01-02 ENCOUNTER — Encounter: Payer: Self-pay | Admitting: Obstetrics and Gynecology

## 2022-01-02 ENCOUNTER — Ambulatory Visit: Payer: BC Managed Care – PPO | Admitting: Obstetrics and Gynecology

## 2022-01-02 VITALS — BP 107/75 | HR 102 | Resp 16 | Ht 62.0 in | Wt 186.0 lb

## 2022-01-02 DIAGNOSIS — R102 Pelvic and perineal pain: Secondary | ICD-10-CM | POA: Diagnosis not present

## 2022-01-02 DIAGNOSIS — Z8742 Personal history of other diseases of the female genital tract: Secondary | ICD-10-CM | POA: Diagnosis not present

## 2022-01-07 DIAGNOSIS — F331 Major depressive disorder, recurrent, moderate: Secondary | ICD-10-CM | POA: Diagnosis not present

## 2022-01-10 ENCOUNTER — Ambulatory Visit
Admission: RE | Admit: 2022-01-10 | Discharge: 2022-01-10 | Disposition: A | Discharge status: Home / Self Care | Payer: BC Managed Care – PPO | Source: Ambulatory Visit | Attending: Family Medicine | Admitting: Family Medicine

## 2022-01-10 DIAGNOSIS — R102 Pelvic and perineal pain: Secondary | ICD-10-CM | POA: Diagnosis not present

## 2022-01-10 DIAGNOSIS — R1031 Right lower quadrant pain: Secondary | ICD-10-CM | POA: Diagnosis not present

## 2022-01-10 DIAGNOSIS — D251 Intramural leiomyoma of uterus: Secondary | ICD-10-CM | POA: Diagnosis not present

## 2022-01-13 ENCOUNTER — Encounter: Payer: Self-pay | Admitting: Obstetrics and Gynecology

## 2022-03-05 DIAGNOSIS — F331 Major depressive disorder, recurrent, moderate: Secondary | ICD-10-CM | POA: Diagnosis not present

## 2022-03-06 ENCOUNTER — Ambulatory Visit
Admission: EM | Admit: 2022-03-06 | Discharge: 2022-03-06 | Disposition: A | Discharge status: Home / Self Care | Payer: BC Managed Care – PPO | Attending: Urgent Care | Admitting: Urgent Care

## 2022-03-06 DIAGNOSIS — J329 Chronic sinusitis, unspecified: Secondary | ICD-10-CM

## 2022-03-06 MED ORDER — AZITHROMYCIN 250 MG PO TABS: ORAL_TABLET | ORAL | 0 refills | Status: DC

## 2022-03-06 MED ORDER — PREDNISONE 10 MG (21) PO TBPK: ORAL_TABLET | Freq: Every day | ORAL | 0 refills | Status: DC

## 2022-03-06 NOTE — ED Provider Notes (Signed)
[facility]    CSN: [id number] Arrival date & time: 03/06/22  1146      History   Chief Complaint Chief Complaint  Patient presents with   Facial Pain   Fatigue    HPI Patricia Horn is a 31 y.o. female.   HPI  Presents to UC with complaint of fatigue x3 days and acute sinus pressure x2 days.  She says sinus pressure is centered around her upper nose and eyes and causing pain in her face.  She denies fever, myalgias, chills.  Denies recent URI symptoms (greater than 3 weeks ago).  Past Medical History:  Diagnosis Date   Anxiety    Depression    Environmental allergies    Fibroid uterus    Tendinitis    Umbilical hernia    Uterine leiomyoma 03/02/2019    Patient Active Problem List   Diagnosis Date Noted   MDD (major depressive disorder), recurrent episode, moderate ([place]) 10/22/2020   Environmental allergies 04/19/2020   Allergy to cats 04/19/2020   Uterine leiomyoma 03/02/2019   Overweight (BMI 25.0-29.9) 03/02/2019   Bilateral hand pain 12/28/2017   Right carpal tunnel syndrome 11/10/2016   Urticaria 15/40/0867   Umbilical cyst 61/95/0932   Perennial allergic rhinitis 11/16/2014   GAD (generalized anxiety disorder) 10/06/2014   ANA positive 10/06/2014    Past Surgical History:  Procedure Laterality Date   HERNIA REPAIR  6712   umbilical    MYOMECTOMY  2015    OB History     Gravida  0   Para  0   Term  0   Preterm  0   AB  0   Living  0      SAB  0   IAB  0   Ectopic  0   Multiple  0   Live Births           Obstetric Comments  1st Menstrual Cycle:  8           Home Medications    Prior to Admission medications   Medication Sig Start Date End Date Taking? Authorizing Provider  ARIPiprazole (ABILIFY) 2 MG tablet Take 1 tablet (2 mg total) by mouth every evening. 12/23/21   Ancil Boozer, Drue Stager, MD  DULoxetine (CYMBALTA) 30 MG capsule Take 1 capsule (30 mg total) by mouth daily. 12/23/21   Steele Sizer, MD   DULoxetine (CYMBALTA) 60 MG capsule Take 1 capsule (60 mg total) by mouth daily. 12/23/21   Steele Sizer, MD  hydrOXYzine (ATARAX) 10 MG tablet Take 1-2 tablets (10-20 mg total) by mouth every 4 (four) hours as needed. 09/24/21   Steele Sizer, MD  Ronnette Juniper FE 1/20 1-20 MG-MCG tablet Take 1 tablet by mouth once daily 11/26/21   Rubie Maid, MD  lisdexamfetamine (VYVANSE) 20 MG capsule Take 1 capsule (20 mg total) by mouth daily. 12/23/21   Steele Sizer, MD    Family History Family History  Problem Relation Age of Onset   Hypertension Mother    Scleroderma Mother    Pulmonary fibrosis Mother    Asthma Mother    Cancer Neg Hx     Social History Social History   Tobacco Use   Smoking status: Never   Smokeless tobacco: Never  Vaping Use   Vaping Use: Never used  Substance Use Topics   Alcohol use: Yes    Alcohol/week: 1.0 standard drink of alcohol    Types: 1 Standard drinks or equivalent per week    Comment:  ocassional   Drug use: No     Allergies   Peanut oil and Peanut-containing drug products   Review of Systems Review of Systems   Physical Exam Triage Vital Signs ED Triage Vitals [03/06/22 1240]  Enc Vitals Group     BP 104/71     Pulse Rate 85     Resp 17     Temp 98.3 F (36.8 C)     Temp src      SpO2 95 %     Weight      Height      Head Circumference      Peak Flow      Pain Score 3     Pain Loc      Pain Edu?      Excl. in [place]?    No data found.  Updated Vital Signs BP 104/71   Pulse 85   Temp 98.3 F (36.8 C)   Resp 17   SpO2 95%   Visual Acuity Right Eye Distance:   Left Eye Distance:   Bilateral Distance:    Right Eye Near:   Left Eye Near:    Bilateral Near:     Physical Exam Vitals reviewed.  Constitutional:      Appearance: Normal appearance.  HENT:     Nose:     Right Sinus: Maxillary sinus tenderness and frontal sinus tenderness present.     Left Sinus: Maxillary sinus tenderness and frontal sinus tenderness  present.  Skin:    General: Skin is warm and dry.  Neurological:     General: No focal deficit present.     Mental Status: She is alert and oriented to person, place, and time.  Psychiatric:        Mood and Affect: Mood normal.        Behavior: Behavior normal.      UC Treatments / Results  Labs (all labs ordered are listed, but only abnormal results are displayed) Labs Reviewed - No data to display  EKG   Radiology No results found.  Procedures Procedures (including critical care time)  Medications Ordered in UC Medications - No data to display  Initial Impression / Assessment and Plan / UC Course  I have reviewed the triage vital signs and the nursing notes.  Pertinent labs & imaging results that were available during my care of the patient were reviewed by me and considered in my medical decision making (see chart for details).   Rhinosinusitis, possibly secondary to past URI which she says was about 3 weeks ago.  Will treat acute sinus pressure with course of prednisone.  Also, will cover possible strep infection secondary to URI with azithromycin.   Final Clinical Impressions(s) / UC Diagnoses   Final diagnoses:  None   Discharge Instructions   None    ED Prescriptions   None    PDMP not reviewed this encounter.   Rose Phi, [place] 03/06/22 1257

## 2022-03-06 NOTE — ED Triage Notes (Signed)
Pt. Presents to UC w/c/o fatigue and sinus pressure for the past 2 days.

## 2022-03-06 NOTE — Discharge Instructions (Signed)
Follow up here or with your primary care provider if your symptoms are worsening or not improving with treatment.     

## 2022-03-20 NOTE — Progress Notes (Signed)
Name: Patricia Horn   MRN: [id number]    DOB: 01/16/1991   Date:03/21/2022       Progress Note  Subjective  Chief Complaint  Sinusitis/ Cough  I connected with  [name]  on 03/21/22 at  9:00 AM EST by a video enabled telemedicine application and verified that I am speaking with the correct person using two identifiers.  I discussed the limitations of evaluation and management by telemedicine and the availability of in person appointments. The patient expressed understanding and agreed to proceed with the virtual visit  Staff also discussed with the patient that there may be a patient responsible charge related to this service. Patient Location: at work  Provider Location: [facility] Additional Individuals present: alone   HPI  Perennial AR:  she has been sick on and off for the past 6 weeks, last episode was bad around 03/04/2022 , went to Urgent Care a few days later and was diagnosed with bronchitis and given a rx for Zpack and prednisone but she never filled it. She continues to have a lot of sinus drainage, green in color, hoarseness, a cough , sneezing, nasal congestion and facial pressure . She has a long history of allergies and since she started a new job at [employer] she has not been able to get her allergy shots.   Patient Active Problem List   Diagnosis Date Noted   MDD (major depressive disorder), recurrent episode, moderate ([place]) 10/22/2020   Environmental allergies 04/19/2020   Allergy to cats 04/19/2020   Uterine leiomyoma 03/02/2019   Overweight (BMI 25.0-29.9) 03/02/2019   Bilateral hand pain 12/28/2017   Right carpal tunnel syndrome 11/10/2016   Urticaria 42/59/5638   Umbilical cyst 75/64/3329   Perennial allergic rhinitis 11/16/2014   GAD (generalized anxiety disorder) 10/06/2014   ANA positive 10/06/2014    Past Surgical History:  Procedure Laterality Date   HERNIA REPAIR  5188   umbilical    MYOMECTOMY  2015    Family History  Problem Relation Age of Onset    Hypertension Mother    Scleroderma Mother    Pulmonary fibrosis Mother    Asthma Mother    Cancer Neg Hx     Social History   Socioeconomic History   Marital status: Single    Spouse name: Not on file   Number of children: 0   Years of education: Not on file   Highest education level: Master's degree (e.g., MA, MS, MEng, MEd, MSW, MBA)  Occupational History   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   Tobacco Use   Smoking status: Never   Smokeless tobacco: Never  Vaping Use   Vaping Use: Never used  Substance and Sexual Activity   Alcohol use: Yes    Alcohol/week: 1.0 standard drink of alcohol    Types: 1 Standard drinks or equivalent per week    Comment: ocassional   Drug use: No   Sexual activity: Not Currently    Partners: Male    Birth control/protection: Pill  Other Topics Concern   Not on file  Social History Narrative   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[employer] , Energy manager   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[place], [place]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    Social Determinants of Health   Financial Resource Strain: [place]  (03/27/2021)   Overall Financial Resource Strain (CARDIA)    Difficulty of  Paying Living Expenses: Not very hard  Food Insecurity: Food Insecurity Present (03/27/2021)   Hunger Vital Sign    Worried About Running Out of Food in the Last Year: Never true    Ran Out of Food in the Last Year: Sometimes true  Transportation Needs: No Transportation Needs (03/27/2021)   PRAPARE - Hydrologist (Medical): No    Lack of Transportation (Non-Medical): No  Physical Activity: Insufficiently Active (03/27/2021)   Exercise Vital Sign    Days of Exercise per Week: 3 days    Minutes of Exercise per Session: 30 min  Stress: Stress Concern Present (03/27/2021)   [place]    Feeling of Stress :  Rather much  Social Connections: Moderately Integrated (03/27/2021)   Social Connection and Isolation Panel [NHANES]    Frequency of Communication with Friends and Family: More than three times a week    Frequency of Social Gatherings with Friends and Family: Once a week    Attends Religious Services: More than 4 times per year    Active Member of [employer] or Organizations: Yes    Attends Archivist Meetings: 1 to 4 times per year    Marital Status: Never married  Intimate Partner Violence: Not At Risk (03/27/2021)   Humiliation, Afraid, Rape, and Kick questionnaire    Fear of Current or Ex-Partner: No    Emotionally Abused: No    Physically Abused: No    Sexually Abused: No     Current Outpatient Medications:    amoxicillin-clavulanate (AUGMENTIN) 875-125 MG tablet, Take 1 tablet by mouth 2 (two) times daily., Disp: 20 tablet, Rfl: 0   ARIPiprazole (ABILIFY) 2 MG tablet, Take 1 tablet (2 mg total) by mouth every evening., Disp: 90 tablet, Rfl: 0   DULoxetine (CYMBALTA) 30 MG capsule, Take 1 capsule (30 mg total) by mouth daily., Disp: 90 capsule, Rfl: 1   DULoxetine (CYMBALTA) 60 MG capsule, Take 1 capsule (60 mg total) by mouth daily., Disp: 90 capsule, Rfl: 1   hydrOXYzine (ATARAX) 10 MG tablet, Take 1-2 tablets (10-20 mg total) by mouth every 4 (four) hours as needed., Disp: 120 tablet, Rfl: 2   LARIN FE 1/20 1-20 MG-MCG tablet, Take 1 tablet by mouth once daily, Disp: 84 tablet, Rfl: 2   levocetirizine (XYZAL) 5 MG tablet, Take 1 tablet (5 mg total) by mouth every evening., Disp: 90 tablet, Rfl: 1   lisdexamfetamine (VYVANSE) 20 MG capsule, Take 1 capsule (20 mg total) by mouth daily., Disp: 30 capsule, Rfl: 0   montelukast (SINGULAIR) 10 MG tablet, Take 1 tablet (10 mg total) by mouth at bedtime., Disp: 90 tablet, Rfl: 1   Olopatadine-Mometasone (RYALTRIS) 665-25 MCG/ACT SUSP, Place 2 sprays into the nose 2 (two) times daily., Disp: 29 g, Rfl: 1  Allergies  Allergen  Reactions   Peanut Oil Swelling    Eyelids swell.   Peanut-Containing Drug Products     I personally reviewed active problem list, medication list, allergies, family history, social history, health maintenance with the patient/caregiver today.   ROS  Ten systems reviewed and is negative except as mentioned in HPI   Objective  Virtual encounter, vitals not obtained.  There is no height or weight on file to calculate BMI.  Physical Exam  Awake, alert and oriented   PHQ2/9:    03/21/2022    8:44 AM 12/23/2021    9:42 AM 09/24/2021    8:49 AM  07/23/2021   10:45 AM 06/18/2021   11:33 AM  Depression screen PHQ 2/9  Decreased Interest 0 1 1 0 2  Down, Depressed, Hopeless 0 0 1 0 2  PHQ - 2 Score 0 1 2 0 4  Altered sleeping 0 '1 3 2 1  '$ Tired, decreased energy 0 3 3 0 0  Change in appetite 0 1 1 0 0  Feeling bad or failure about yourself  0 0 1 0 0  Trouble concentrating 0 '3 1 3 '$ 0  Moving slowly or fidgety/restless 0 0 0 0 0  Suicidal thoughts 0 0 1 0 0  PHQ-9 Score 0 '9 12 5 5  '$ Difficult doing work/chores     Somewhat difficult   PHQ-2/9 Result is positive.    Fall Risk:    03/21/2022    8:44 AM 01/02/2022    8:49 AM 12/23/2021    9:42 AM 09/24/2021    8:49 AM 07/23/2021   10:41 AM  Fall Risk   Falls in the past year? 0 0 0 0 0  Number falls in past yr:  0 0 0 0  Injury with Fall?  0 0 0 0  Risk for fall due to : No Fall Risks No Fall Risks No Fall Risks No Fall Risks No Fall Risks  Follow up Falls prevention discussed Falls evaluation completed Falls prevention discussed Falls prevention discussed Falls prevention discussed     Assessment & Plan  1. Perennial allergic rhinitis  - Olopatadine-Mometasone (RYALTRIS) 665-25 MCG/ACT SUSP; Place 2 sprays into the nose 2 (two) times daily.  Dispense: 29 g; Refill: 1 - levocetirizine (XYZAL) 5 MG tablet; Take 1 tablet (5 mg total) by mouth every evening.  Dispense: 90 tablet; Refill: 1 - montelukast (SINGULAIR) 10 MG  tablet; Take 1 tablet (10 mg total) by mouth at bedtime.  Dispense: 90 tablet; Refill: 1  Advised her to try getting back to allergist to resume allergy shots, we will treat her allergies with above medications and treat sinus infection with antibiotics   2. Allergy to cats  - Olopatadine-Mometasone (RYALTRIS) 665-25 MCG/ACT SUSP; Place 2 sprays into the nose 2 (two) times daily.  Dispense: 29 g; Refill: 1 - levocetirizine (XYZAL) 5 MG tablet; Take 1 tablet (5 mg total) by mouth every evening.  Dispense: 90 tablet; Refill: 1 - montelukast (SINGULAIR) 10 MG tablet; Take 1 tablet (10 mg total) by mouth at bedtime.  Dispense: 90 tablet; Refill: 1  3. Subacute maxillary sinusitis  - amoxicillin-clavulanate (AUGMENTIN) 875-125 MG tablet; Take 1 tablet by mouth 2 (two) times daily.  Dispense: 20 tablet; Refill: 0   I discussed the assessment and treatment plan with the patient. The patient was provided an opportunity to ask questions and all were answered. The patient agreed with the plan and demonstrated an understanding of the instructions.  The patient was advised to call back or seek an in-person evaluation if the symptoms worsen or if the condition fails to improve as anticipated.  I provided 15  minutes of non-face-to-face time during this encounter.

## 2022-03-21 ENCOUNTER — Encounter: Payer: Self-pay | Admitting: Family Medicine

## 2022-03-21 ENCOUNTER — Telehealth: Payer: BC Managed Care – PPO | Admitting: Family Medicine

## 2022-03-21 DIAGNOSIS — J3081 Allergic rhinitis due to animal (cat) (dog) hair and dander: Secondary | ICD-10-CM | POA: Diagnosis not present

## 2022-03-21 DIAGNOSIS — J3089 Other allergic rhinitis: Secondary | ICD-10-CM | POA: Diagnosis not present

## 2022-03-21 DIAGNOSIS — J0100 Acute maxillary sinusitis, unspecified: Secondary | ICD-10-CM | POA: Diagnosis not present

## 2022-03-21 MED ORDER — RYALTRIS 665-25 MCG/ACT NA SUSP: 2.0000 | Freq: Two times a day (BID) | NASAL | 1 refills | Status: DC

## 2022-03-21 MED ORDER — LEVOCETIRIZINE DIHYDROCHLORIDE 5 MG PO TABS: 5.0000 mg | ORAL_TABLET | Freq: Every evening | ORAL | 1 refills | Status: DC

## 2022-03-21 MED ORDER — MONTELUKAST SODIUM 10 MG PO TABS: 10.0000 mg | ORAL_TABLET | Freq: Every day | ORAL | 1 refills | Status: DC

## 2022-03-21 MED ORDER — AMOXICILLIN-POT CLAVULANATE 875-125 MG PO TABS: 1.0000 | ORAL_TABLET | Freq: Two times a day (BID) | ORAL | 0 refills | Status: DC

## 2022-03-24 NOTE — Progress Notes (Signed)
Name: Patricia Horn   MRN: [id number]    DOB: [birth year]   Date:03/25/2022       Progress Note  Subjective  Chief Complaint  Follow Up  HPI  Depression/ Major GAD:  she did not get Abilify filled this last time. She is taking Duloxetine 90 mg daily and hydroxizine prn, she is feeling worse now. She states she was dating and in October the boyfriend stopped contacting her . She has been eating a lot of junk and not able to stop. She is also not as active since the cycle bar studio in town closed. She has seasonal affective disorder and the shorter days are also affecting her mood. She is now working at [employer] and that is good. Her counselor is [name].    Insomnia: she is able to fall asleep but has interrupted sleep. She is taking hydroxizine prn  Allergies: she was seeing Dr. Virgia Land and having allergy shots but with job change she has not been able to take time off. She had been sick on and off for weeks, she had a virtual visit last week and since started back on allergy medication and augmentin she is feeling much better   Patient Active Problem List   Diagnosis Date Noted   MDD (major depressive disorder), recurrent episode, moderate ([place]) 10/22/2020   Environmental allergies 04/19/2020   Allergy to cats 04/19/2020   Uterine leiomyoma 03/02/2019   Overweight (BMI 25.0-29.9) 03/02/2019   Bilateral hand pain 12/28/2017   Right carpal tunnel syndrome 11/10/2016   Urticaria 25/42/7062   Umbilical cyst 37/62/8315   Perennial allergic rhinitis 11/16/2014   GAD (generalized anxiety disorder) 10/06/2014   ANA positive 10/06/2014    Past Surgical History:  Procedure Laterality Date   HERNIA REPAIR  1761   umbilical    MYOMECTOMY  2015    Family History  Problem Relation Age of Onset   Hypertension Mother    Scleroderma Mother    Pulmonary fibrosis Mother    Asthma Mother    Cancer Neg Hx     Social History   Tobacco Use   Smoking status: Never   Smokeless tobacco:  Never  Substance Use Topics   Alcohol use: Yes    Alcohol/week: 1.0 standard drink of alcohol    Types: 1 Standard drinks or equivalent per week    Comment: ocassional     Current Outpatient Medications:    amoxicillin-clavulanate (AUGMENTIN) 875-125 MG tablet, Take 1 tablet by mouth 2 (two) times daily., Disp: 20 tablet, Rfl: 0   ARIPiprazole (ABILIFY) 2 MG tablet, Take 1 tablet (2 mg total) by mouth every evening., Disp: 90 tablet, Rfl: 0   DULoxetine (CYMBALTA) 30 MG capsule, Take 1 capsule (30 mg total) by mouth daily., Disp: 90 capsule, Rfl: 1   DULoxetine (CYMBALTA) 60 MG capsule, Take 1 capsule (60 mg total) by mouth daily., Disp: 90 capsule, Rfl: 1   hydrOXYzine (ATARAX) 10 MG tablet, Take 1-2 tablets (10-20 mg total) by mouth every 4 (four) hours as needed., Disp: 120 tablet, Rfl: 2   LARIN FE 1/20 1-20 MG-MCG tablet, Take 1 tablet by mouth once daily, Disp: 84 tablet, Rfl: 2   levocetirizine (XYZAL) 5 MG tablet, Take 1 tablet (5 mg total) by mouth every evening., Disp: 90 tablet, Rfl: 1   lisdexamfetamine (VYVANSE) 20 MG capsule, Take 1 capsule (20 mg total) by mouth daily., Disp: 30 capsule, Rfl: 0   montelukast (SINGULAIR) 10 MG tablet, Take 1 tablet (10  mg total) by mouth at bedtime., Disp: 90 tablet, Rfl: 1   Olopatadine-Mometasone (RYALTRIS) 665-25 MCG/ACT SUSP, Place 2 sprays into the nose 2 (two) times daily., Disp: 29 g, Rfl: 1  Allergies  Allergen Reactions   Peanut Oil Swelling    Eyelids swell.   Peanut-Containing Drug Products     I personally reviewed active problem list, medication list, allergies, family history, social history, health maintenance with the patient/caregiver today.   ROS  Ten systems reviewed and is negative except as mentioned in HPI   Objective  Vitals:   03/25/22 0858  BP: 116/78  Pulse: 91  Resp: 16  SpO2: 99%  Weight: 194 lb (88 kg)  Height: '5\' 2"'$  (1.575 m)    Body mass index is 35.48 kg/m.  Physical  Exam  Constitutional: Patient appears well-developed and well-nourished. Obese  No distress.  HEENT: head atraumatic, normocephalic, pupils equal and reactive to light, , neck supple Cardiovascular: Normal rate, regular rhythm and normal heart sounds.  No murmur heard. No BLE edema. Pulmonary/Chest: Effort normal and breath sounds normal. No respiratory distress. Abdominal: Soft.  There is no tenderness. Psychiatric: Patient has a normal mood and affect. behavior is normal. Judgment and thought content normal  PHQ2/9:    03/25/2022    8:57 AM 03/21/2022    8:44 AM 12/23/2021    9:42 AM 09/24/2021    8:49 AM 07/23/2021   10:45 AM  Depression screen PHQ 2/9  Decreased Interest 3 0 1 1 0  Down, Depressed, Hopeless 3 0 0 1 0  PHQ - 2 Score 6 0 1 2 0  Altered sleeping 3 0 '1 3 2  '$ Tired, decreased energy 3 0 3 3 0  Change in appetite 3 0 1 1 0  Feeling bad or failure about yourself  0 0 0 1 0  Trouble concentrating 1 0 '3 1 3  '$ Moving slowly or fidgety/restless 0 0 0 0 0  Suicidal thoughts 0 0 0 1 0  PHQ-9 Score 16 0 '9 12 5    '$ phq 9 is positive   Fall Risk:    03/25/2022    8:57 AM 03/21/2022    8:44 AM 01/02/2022    8:49 AM 12/23/2021    9:42 AM 09/24/2021    8:49 AM  Fall Risk   Falls in the past year? 0 0 0 0 0  Number falls in past yr: 0  0 0 0  Injury with Fall? 0  0 0 0  Risk for fall due to : No Fall Risks No Fall Risks No Fall Risks No Fall Risks No Fall Risks  Follow up Falls prevention discussed Falls prevention discussed Falls evaluation completed Falls prevention discussed Falls prevention discussed      Functional Status Survey: Is the patient deaf or have difficulty hearing?: No Does the patient have difficulty seeing, even when wearing glasses/contacts?: No Does the patient have difficulty concentrating, remembering, or making decisions?: No Does the patient have difficulty walking or climbing stairs?: No Does the patient have difficulty dressing or bathing?:  No Does the patient have difficulty doing errands alone such as visiting a doctor's office or shopping?: No    Assessment & Plan  1. Need for immunization against influenza  - Flu Vaccine QUAD 6+ mos PF IM (Fluarix Quad PF)  2. MDD (major depressive disorder), recurrent episode, moderate ([facility])  - lisdexamfetamine (VYVANSE) 30 MG capsule; Take 1 capsule (30 mg total) by mouth daily.  Dispense: 90 capsule;  Refill: 0 - cariprazine (VRAYLAR) 1.5 MG capsule; Take 1 capsule (1.5 mg total) by mouth daily.  Dispense: 30 capsule; Refill: 2  3. GAD (generalized anxiety disorder)   4. Perennial allergic rhinitis   5. Allergy to cats

## 2022-03-25 ENCOUNTER — Ambulatory Visit: Payer: BC Managed Care – PPO | Admitting: Family Medicine

## 2022-03-25 ENCOUNTER — Encounter: Payer: Self-pay | Admitting: Family Medicine

## 2022-03-25 VITALS — BP 116/78 | HR 91 | Resp 16 | Ht 62.0 in | Wt 194.0 lb

## 2022-03-25 DIAGNOSIS — F411 Generalized anxiety disorder: Secondary | ICD-10-CM

## 2022-03-25 DIAGNOSIS — F331 Major depressive disorder, recurrent, moderate: Secondary | ICD-10-CM | POA: Diagnosis not present

## 2022-03-25 DIAGNOSIS — Z23 Encounter for immunization: Secondary | ICD-10-CM

## 2022-03-25 DIAGNOSIS — J3081 Allergic rhinitis due to animal (cat) (dog) hair and dander: Secondary | ICD-10-CM | POA: Diagnosis not present

## 2022-03-25 DIAGNOSIS — J3089 Other allergic rhinitis: Secondary | ICD-10-CM | POA: Diagnosis not present

## 2022-03-25 MED ORDER — LISDEXAMFETAMINE DIMESYLATE 30 MG PO CAPS: 30.0000 mg | ORAL_CAPSULE | Freq: Every day | ORAL | 0 refills | Status: DC

## 2022-03-25 MED ORDER — CARIPRAZINE HCL 1.5 MG PO CAPS: 1.5000 mg | ORAL_CAPSULE | Freq: Every day | ORAL | 2 refills | Status: DC

## 2022-03-28 ENCOUNTER — Encounter: Payer: Self-pay | Admitting: Family Medicine

## 2022-04-01 ENCOUNTER — Telehealth (INDEPENDENT_AMBULATORY_CARE_PROVIDER_SITE_OTHER): Payer: BC Managed Care – PPO | Admitting: Family Medicine

## 2022-04-01 ENCOUNTER — Encounter: Payer: Self-pay | Admitting: Family Medicine

## 2022-04-01 DIAGNOSIS — Z713 Dietary counseling and surveillance: Secondary | ICD-10-CM

## 2022-04-01 DIAGNOSIS — E669 Obesity, unspecified: Secondary | ICD-10-CM | POA: Diagnosis not present

## 2022-04-01 MED ORDER — SEMAGLUTIDE-WEIGHT MANAGEMENT 0.5 MG/0.5ML ~~LOC~~ SOAJ: 0.5000 mg | SUBCUTANEOUS | 0 refills | Status: DC

## 2022-04-01 MED ORDER — SEMAGLUTIDE-WEIGHT MANAGEMENT 0.25 MG/0.5ML ~~LOC~~ SOAJ: 0.2500 mg | SUBCUTANEOUS | 0 refills | Status: DC

## 2022-04-01 MED ORDER — SEMAGLUTIDE-WEIGHT MANAGEMENT 1 MG/0.5ML ~~LOC~~ SOAJ: 1.0000 mg | SUBCUTANEOUS | 0 refills | Status: DC

## 2022-04-01 NOTE — Progress Notes (Signed)
Name: Patricia Horn   MRN: [id number]    DOB: 1991/01/29   Date:04/01/2022       Progress Note  Subjective  Chief Complaint  Discuss Weight Loss  I connected with  [name]  on 04/01/22 at  3:40 PM EST by a video enabled telemedicine application and verified that I am speaking with the correct person using two identifiers.  I discussed the limitations of evaluation and management by telemedicine and the availability of in person appointments. The patient expressed understanding and agreed to proceed with the virtual visit  Staff also discussed with the patient that there may be a patient responsible charge related to this service. Patient Location: at home  Provider Location: [facility] Additional Individuals present: alone  HPI  Obesity: her weight in high school was around 130-135 lbs, that was her ideal body weight - BMI below 25. Around senior in high school through freshman year in college her weight went up to 200 lbs. After that her weight has been up and down Her weight was between 170-174 lbs from 2022 and May 2023. Up to 187 lbs in August and it spiked to 194 lbs in Nov, that is at 24 lbs weight gain in one year. She states when really happy or really sad she does not plan her meals and eat for comfort. BMI is above 35. Her gym closed down, used to go to the bike bar. She just resumed personal training yesterday. She also bought healthier foods and is going to start meal prepping again so she does not eat junk instead . She called insurance and Mancel Parsons is covered for weight loss. She has tried Contrave in the past but could not tolerate due to nausea.   She never had pancreatitis and negative family history of thyroid cancer   Patient Active Problem List   Diagnosis Date Noted   MDD (major depressive disorder), recurrent episode, moderate ([place]) 10/22/2020   Environmental allergies 04/19/2020   Allergy to cats 04/19/2020   Uterine leiomyoma 03/02/2019   Bilateral hand pain  12/28/2017   Right carpal tunnel syndrome 11/10/2016   Urticaria 96/22/2979   Umbilical cyst 89/21/1941   Perennial allergic rhinitis 11/16/2014   GAD (generalized anxiety disorder) 10/06/2014   ANA positive 10/06/2014    Past Surgical History:  Procedure Laterality Date   HERNIA REPAIR  7408   umbilical    MYOMECTOMY  2015    Family History  Problem Relation Age of Onset   Hypertension Mother    Scleroderma Mother    Pulmonary fibrosis Mother    Asthma Mother    Cancer Neg Hx     Social History   Socioeconomic History   Marital status: Single    Spouse name: Not on file   Number of children: 0   Years of education: Not on file   Highest education level: Master's degree (e.g., MA, MS, MEng, MEd, MSW, MBA)  Occupational History   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   Tobacco Use   Smoking status: Never   Smokeless tobacco: Never  Vaping Use   Vaping Use: Never used  Substance and Sexual Activity   Alcohol use: Yes    Alcohol/week: 1.0 standard drink of alcohol    Types: 1 Standard drinks or equivalent per week    Comment: ocassional   Drug use: No   Sexual activity: Not Currently    Partners: Male    Birth  control/protection: Pill  Other Topics Concern   Not on file  Social History Narrative   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  (03/27/2021)   Overall Financial Resource Strain (CARDIA)    Difficulty of Paying Living Expenses: Not very hard  Food Insecurity: Food Insecurity Present (03/27/2021)   Hunger Vital Sign    Worried About [place] in the Last Year: Never true    [place] in the Last Year: Sometimes true  Transportation Needs: No Transportation Needs (03/27/2021)   PRAPARE - Civil engineer, contracting (Medical): No    Lack of Transportation (Non-Medical): No  Physical Activity: Insufficiently Active (03/27/2021)   Exercise Vital Sign    Days of Exercise per Week: 3 days    Minutes of Exercise per Session: 30 min  Stress: Stress Concern Present (03/27/2021)   [place]    Feeling of Stress : Rather much  Social Connections: Moderately Integrated (03/27/2021)   Social Connection and Isolation Panel [NHANES]    Frequency of Communication with Friends and Family: More than three times a week    Frequency of Social Gatherings with Friends and Family: Once a week    Attends Religious Services: More than 4 times per year    Active Member of [employer] or Organizations: Yes    Attends Archivist Meetings: 1 to 4 times per year    Marital Status: Never married  Intimate Partner Violence: Not At Risk (03/27/2021)   Humiliation, Afraid, Rape, and Kick questionnaire    Fear of Current or Ex-Partner: No    Emotionally Abused: No    Physically Abused: No    Sexually Abused: No     Current Outpatient Medications:    amoxicillin-clavulanate (AUGMENTIN) 875-125 MG tablet, Take 1 tablet by mouth 2 (two) times daily., Disp: 20 tablet, Rfl: 0   cariprazine (VRAYLAR) 1.5 MG capsule, Take 1 capsule (1.5 mg total) by mouth daily., Disp: 30 capsule, Rfl: 2   DULoxetine (CYMBALTA) 30 MG capsule, Take 1 capsule (30 mg total) by mouth daily., Disp: 90 capsule, Rfl: 1   DULoxetine (CYMBALTA) 60 MG capsule, Take 1 capsule (60 mg total) by mouth daily., Disp: 90 capsule, Rfl: 1   hydrOXYzine (ATARAX) 10 MG tablet, Take 1-2 tablets (10-20 mg total) by mouth every 4 (four) hours as needed., Disp: 120 tablet, Rfl: 2   LARIN FE 1/20 1-20 MG-MCG tablet, Take 1 tablet by mouth once daily, Disp: 84 tablet, Rfl: 2   levocetirizine (XYZAL) 5 MG tablet, Take 1 tablet (5 mg total) by mouth every evening., Disp: 90 tablet, Rfl: 1    lisdexamfetamine (VYVANSE) 30 MG capsule, Take 1 capsule (30 mg total) by mouth daily., Disp: 90 capsule, Rfl: 0   montelukast (SINGULAIR) 10 MG tablet, Take 1 tablet (10 mg total) by mouth at bedtime., Disp: 90 tablet, Rfl: 1   Olopatadine-Mometasone (RYALTRIS) 665-25 MCG/ACT SUSP, Place 2 sprays into the nose 2 (two) times daily., Disp: 29 g, Rfl: 1  Allergies  Allergen Reactions   Peanut Oil Swelling    Eyelids swell.   Peanut-Containing Drug Products     I personally reviewed active problem list, medication list, allergies, family history, social history,  health maintenance with the patient/caregiver today.   ROS  Ten systems reviewed and is negative except as mentioned in HPI    Objective  Virtual encounter, vitals not obtained.  There is no height or weight on file to calculate BMI.  Physical Exam  Awake, alert and oriented     Fall Risk:    03/25/2022    8:57 AM 03/21/2022    8:44 AM 01/02/2022    8:49 AM 12/23/2021    9:42 AM 09/24/2021    8:49 AM  Fall Risk   Falls in the past year? 0 0 0 0 0  Number falls in past yr: 0  0 0 0  Injury with Fall? 0  0 0 0  Risk for fall due to : No Fall Risks No Fall Risks No Fall Risks No Fall Risks No Fall Risks  Follow up Falls prevention discussed Falls prevention discussed Falls evaluation completed Falls prevention discussed Falls prevention discussed     Assessment & Plan   I discussed the assessment and treatment plan with the patient. The patient was provided an opportunity to ask questions and all were answered. The patient agreed with the plan and demonstrated an understanding of the instructions.  The patient was advised to call back or seek an in-person evaluation if the symptoms worsen or if the condition fails to improve as anticipated.  I provided 15  minutes of non-face-to-face time during this encounter.

## 2022-04-07 DIAGNOSIS — F331 Major depressive disorder, recurrent, moderate: Secondary | ICD-10-CM | POA: Diagnosis not present

## 2022-04-15 ENCOUNTER — Other Ambulatory Visit: Payer: Self-pay | Admitting: Family Medicine

## 2022-04-15 ENCOUNTER — Encounter: Payer: Self-pay | Admitting: Family Medicine

## 2022-04-16 DIAGNOSIS — J301 Allergic rhinitis due to pollen: Secondary | ICD-10-CM | POA: Diagnosis not present

## 2022-04-25 ENCOUNTER — Other Ambulatory Visit: Payer: Self-pay | Admitting: Family Medicine

## 2022-04-25 ENCOUNTER — Encounter: Payer: Self-pay | Admitting: Family Medicine

## 2022-04-25 MED ORDER — ZEPBOUND 5 MG/0.5ML ~~LOC~~ SOAJ: 5.0000 mg | SUBCUTANEOUS | 0 refills | Status: DC

## 2022-05-07 ENCOUNTER — Other Ambulatory Visit: Payer: Self-pay | Admitting: Family Medicine

## 2022-05-07 DIAGNOSIS — E669 Obesity, unspecified: Secondary | ICD-10-CM

## 2022-05-07 MED ORDER — WEGOVY 1 MG/0.5ML ~~LOC~~ SOAJ: 1.0000 mg | SUBCUTANEOUS | 0 refills | Status: DC

## 2022-05-23 DIAGNOSIS — F331 Major depressive disorder, recurrent, moderate: Secondary | ICD-10-CM | POA: Diagnosis not present

## 2022-06-12 ENCOUNTER — Other Ambulatory Visit: Payer: Self-pay | Admitting: Obstetrics and Gynecology

## 2022-06-12 ENCOUNTER — Other Ambulatory Visit: Payer: Self-pay

## 2022-06-12 DIAGNOSIS — Z3041 Encounter for surveillance of contraceptive pills: Secondary | ICD-10-CM

## 2022-06-12 MED ORDER — NORETHIN ACE-ETH ESTRAD-FE 1-20 MG-MCG PO TABS: 1.0000 | ORAL_TABLET | Freq: Every day | ORAL | 0 refills | Status: DC

## 2022-06-12 NOTE — Telephone Encounter (Signed)
Pt made her annual visit and needed refills on her BC pills. Refills sent pt aware.

## 2022-06-16 ENCOUNTER — Telehealth: Payer: Self-pay

## 2022-06-16 DIAGNOSIS — J301 Allergic rhinitis due to pollen: Secondary | ICD-10-CM | POA: Diagnosis not present

## 2022-06-17 ENCOUNTER — Encounter: Payer: Self-pay | Admitting: Family Medicine

## 2022-06-23 NOTE — Telephone Encounter (Signed)
06/16/22   Patient Called this morning asking about getting [facility] Allergy testing done here in the clinic.    06/23/22 Patient sent an email to [facility] which was forwarded to [facility] for follow up on this inquiry. Patient gets maintenance allergy shots and has her serum with her. I have responded to her via email letting her know that we need the orders forms from her allergy MD for the instructions on what to give her. Becky please follow up with the patient as soon as possible, she is a fac/staff member. Thank you

## 2022-06-24 ENCOUNTER — Telehealth: Payer: Self-pay

## 2022-06-24 NOTE — Telephone Encounter (Signed)
Telephone call to patient to get her scheduled for her weekly allergy injection.  Left a message for her to call the front desk and schedule her allergy appointment due to me expecting some patients this afternoon.  Provided the number to call [phone].  Dahlia Bailiff, RN

## 2022-06-24 NOTE — Telephone Encounter (Signed)
Return call by patient today.  Immunotherapy appointment scheduled for Wednesday 06-25-22 at 10:30 am.  Dahlia Bailiff, RN

## 2022-06-25 ENCOUNTER — Other Ambulatory Visit: Payer: Self-pay | Admitting: Family Medicine

## 2022-06-25 ENCOUNTER — Ambulatory Visit (INDEPENDENT_AMBULATORY_CARE_PROVIDER_SITE_OTHER): Payer: BC Managed Care – PPO

## 2022-06-25 DIAGNOSIS — Z2989 Encounter for other specified prophylactic measures: Secondary | ICD-10-CM

## 2022-06-25 DIAGNOSIS — J309 Allergic rhinitis, unspecified: Secondary | ICD-10-CM

## 2022-06-25 MED ORDER — EPINEPHRINE 0.3 MG/0.3ML IJ SOAJ: 0.3000 mg | INTRAMUSCULAR | 5 refills | Status: AC | PRN

## 2022-06-25 MED ORDER — WEGOVY 1.7 MG/0.75ML ~~LOC~~ SOAJ: 1.7000 mg | SUBCUTANEOUS | 0 refills | Status: DC

## 2022-06-25 NOTE — Progress Notes (Signed)
Patient in clinic today for her immunotherapy (allergy injection).  Patient has an Epi-pen that is current, but left it at her office a couple of buildings down from the clinic.  Per Dr. Oran Rein, she should go retrieve it and then return back for her allergy injection. Patient returned with Epi-pen, which expired November 2022.  Per Dr. Oran Rein, ok to proceed with allergy injection, but she will need to pick up her new prescription set to the pharmacy by Dr. Oran Rein today. Injection give today and tolerated well.  Injection site without any induration.  Next appointment scheduled. Immunotherapy consent signed and paperwork reviewed.  Dahlia Bailiff, RN

## 2022-07-02 ENCOUNTER — Ambulatory Visit (INDEPENDENT_AMBULATORY_CARE_PROVIDER_SITE_OTHER): Payer: BC Managed Care – PPO

## 2022-07-02 DIAGNOSIS — J309 Allergic rhinitis, unspecified: Secondary | ICD-10-CM | POA: Diagnosis not present

## 2022-07-02 DIAGNOSIS — J301 Allergic rhinitis due to pollen: Secondary | ICD-10-CM | POA: Diagnosis not present

## 2022-07-02 NOTE — Progress Notes (Signed)
Patient in clinic today for her immunotherapy (allergy injection).  Injection given and tolerated well.  Injection site without redness and smaller than pea size induration.  Next appointment scheduled.  Dahlia Bailiff, RN

## 2022-07-08 DIAGNOSIS — F331 Major depressive disorder, recurrent, moderate: Secondary | ICD-10-CM | POA: Diagnosis not present

## 2022-07-08 NOTE — Progress Notes (Unsigned)
GYNECOLOGY ANNUAL PHYSICAL EXAM PROGRESS NOTE  Subjective:    Patricia Horn is a 32 y.o. G0P0000 female who presents for an annual exam. The patient has no complaints today. The patient {is/is not/has never been:13135} sexually active. The patient participates in regular exercise: {yes/no/not asked:9010}. Has the patient ever been transfused or tattooed?: {yes/no/not asked:9010}. The patient reports that there {is/is not:9024} domestic violence in her life.    Menstrual History: Menarche age: *** No LMP recorded. (Menstrual status: Oral contraceptives).     Gynecologic History:  Contraception: {method:5051} History of STI's:  Last Pap: ***. Results were: {norm/abn:16337}.  ***Denies/Notes h/o abnormal pap smears. Last mammogram: ***. Results were: {norm/abn:16337}       OB History  Gravida Para Term Preterm AB Living  0 0 0 0 0 0  SAB IAB Ectopic Multiple Live Births  0 0 0 0 0  Obstetric Comments  1st Menstrual Cycle:  8    Past Medical History:  Diagnosis Date   Anxiety    Depression    Environmental allergies    Fibroid uterus    Tendinitis    Umbilical hernia    Uterine leiomyoma 03/02/2019    Past Surgical History:  Procedure Laterality Date   HERNIA REPAIR  [id number]   umbilical    MYOMECTOMY  2015    Family History  Problem Relation Age of Onset   Hypertension Mother    Scleroderma Mother    Pulmonary fibrosis Mother    Asthma Mother    Cancer Neg Hx     Social History   Socioeconomic History   Marital status: Single    Spouse name: Not on file   Number of children: 0   Years of education: Not on file   Highest education level: Master's degree (e.g., MA, MS, MEng, MEd, MSW, MBA)  Occupational History   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   Tobacco Use   Smoking status: Never   Smokeless tobacco: Never  Vaping Use   Vaping Use: Never used  Substance and Sexual Activity   Alcohol use: Yes     Alcohol/week: 1.0 standard drink of alcohol    Types: 1 Standard drinks or equivalent per week    Comment: ocassional   Drug use: No   Sexual activity: Not Currently    Partners: Male    Birth control/protection: Pill  Other Topics Concern   Not on file  Social History Narrative   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[employer] , Energy manager   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[place], [place]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    Social Determinants of Health   Financial Resource Strain: [place]  (03/27/2021)   Overall Financial Resource Strain (CARDIA)    Difficulty of Paying Living Expenses: Not very hard  Food Insecurity: Food Insecurity Present (03/27/2021)   Hunger Vital Sign    Worried About [place] in the Last Year: Never true    [place] in the Last Year: Sometimes true  Transportation Needs: No Transportation Needs (03/27/2021)   PRAPARE - Hydrologist (Medical): No    Lack of Transportation (Non-Medical): No  Physical Activity: Insufficiently Active (03/27/2021)   Exercise Vital Sign    Days of Exercise per Week: 3 days    Minutes of Exercise per Session: 30 min  Stress: Stress Concern Present (03/27/2021)  [place] Questionnaire    Feeling of Stress : Rather much  Social Connections: Moderately Integrated (03/27/2021)   Social Connection and Isolation Panel [NHANES]    Frequency of Communication with Friends and Family: More than three times a week    Frequency of Social Gatherings with Friends and Family: Once a week    Attends Religious Services: More than 4 times per year    Active Member of [employer] or Organizations: Yes    Attends Archivist Meetings: 1 to 4 times per year    Marital Status: Never married  Intimate Partner Violence: Not At Risk (03/27/2021)   Humiliation, Afraid, Rape, and Kick questionnaire    Fear of Current or Ex-Partner:  No    Emotionally Abused: No    Physically Abused: No    Sexually Abused: No    Current Outpatient Medications on File Prior to Visit  Medication Sig Dispense Refill   Semaglutide-Weight Management (WEGOVY) 1.7 MG/0.75ML SOAJ Inject 1.7 mg into the skin once a week. (Patient not taking: Reported on 07/02/2022) 3 mL 0   cariprazine (VRAYLAR) 1.5 MG capsule Take 1 capsule (1.5 mg total) by mouth daily. 30 capsule 2   DULoxetine (CYMBALTA) 30 MG capsule Take 1 capsule (30 mg total) by mouth daily. 90 capsule 1   DULoxetine (CYMBALTA) 60 MG capsule Take 1 capsule (60 mg total) by mouth daily. 90 capsule 1   EPINEPHrine 0.3 mg/0.3 mL IJ SOAJ injection Inject 0.3 mg into the muscle as needed for anaphylaxis. 1 each 5   hydrOXYzine (ATARAX) 10 MG tablet Take 1-2 tablets (10-20 mg total) by mouth every 4 (four) hours as needed. 120 tablet 2   levocetirizine (XYZAL) 5 MG tablet Take 1 tablet (5 mg total) by mouth every evening. 90 tablet 1   montelukast (SINGULAIR) 10 MG tablet Take 1 tablet (10 mg total) by mouth at bedtime. 90 tablet 1   norethindrone-ethinyl estradiol-FE (LARIN FE 1/20) 1-20 MG-MCG tablet Take 1 tablet by mouth daily. 84 tablet 0   Olopatadine-Mometasone (RYALTRIS) 665-25 MCG/ACT SUSP Place 2 sprays into the nose 2 (two) times daily. 29 g 1   Semaglutide-Weight Management (WEGOVY) 1 MG/0.5ML SOAJ Inject 1 mg into the skin once a week. 2 mL 0   No current facility-administered medications on file prior to visit.    Allergies  Allergen Reactions   Peanut Oil Swelling    Eyelids swell.   Peanut-Containing Drug Products      Review of Systems Constitutional: negative for chills, fatigue, fevers and sweats Eyes: negative for irritation, redness and visual disturbance Ears, nose, mouth, throat, and face: negative for hearing loss, nasal congestion, snoring and tinnitus Respiratory: negative for asthma, cough, sputum Cardiovascular: negative for chest pain, dyspnea, exertional  chest pressure/discomfort, irregular heart beat, palpitations and syncope Gastrointestinal: negative for abdominal pain, change in bowel habits, nausea and vomiting Genitourinary: negative for abnormal menstrual periods, genital lesions, sexual problems and vaginal discharge, dysuria and urinary incontinence Integument/breast: negative for breast lump, breast tenderness and nipple discharge Hematologic/lymphatic: negative for bleeding and easy bruising Musculoskeletal:negative for back pain and muscle weakness Neurological: negative for dizziness, headaches, vertigo and weakness Endocrine: negative for diabetic symptoms including polydipsia, polyuria and skin dryness Allergic/Immunologic: negative for hay fever and urticaria      Objective:  There were no vitals taken for this visit. There is no height or weight on file to calculate BMI.    General Appearance:  Alert, cooperative, no distress, appears stated age  Head:    Normocephalic, without obvious abnormality, atraumatic  Eyes:    PERRL, conjunctiva/corneas clear, EOM's intact, both eyes  Ears:    Normal external ear canals, both ears  Nose:   Nares normal, septum midline, mucosa normal, no drainage or sinus tenderness  Throat:   Lips, mucosa, and tongue normal; teeth and gums normal  Neck:   Supple, symmetrical, trachea midline, no adenopathy; thyroid: no enlargement/tenderness/nodules; no carotid bruit or JVD  Back:     Symmetric, no curvature, ROM normal, no CVA tenderness  Lungs:     Clear to auscultation bilaterally, respirations unlabored  Chest Wall:    No tenderness or deformity   Heart:    Regular rate and rhythm, S1 and S2 normal, no murmur, rub or gallop  Breast Exam:    No tenderness, masses, or nipple abnormality  Abdomen:     Soft, non-tender, bowel sounds active all four quadrants, no masses, no organomegaly.    Genitalia:    Pelvic:external genitalia normal, vagina without lesions, discharge, or tenderness,  rectovaginal septum  normal. Cervix normal in appearance, no cervical motion tenderness, no adnexal masses or tenderness.  Uterus normal size, shape, mobile, regular contours, nontender.  Rectal:    Normal external sphincter.  No hemorrhoids appreciated. Internal exam not done.   Extremities:   Extremities normal, atraumatic, no cyanosis or edema  Pulses:   2+ and symmetric all extremities  Skin:   Skin color, texture, turgor normal, no rashes or lesions  Lymph nodes:   Cervical, supraclavicular, and axillary nodes normal  Neurologic:   CNII-XII intact, normal strength, sensation and reflexes throughout   .  Labs:  Lab Results  Component Value Date   WBC 5.7 03/08/2021   HGB 12.9 03/08/2021   HCT 37.9 03/08/2021   MCV 94 03/08/2021   PLT 361 03/08/2021    Lab Results  Component Value Date   CREATININE 0.86 03/08/2021   BUN 14 03/08/2021   NA 139 03/08/2021   K 4.4 03/08/2021   CL 102 03/08/2021   CO2 24 03/08/2021    Lab Results  Component Value Date   ALT 13 03/08/2021   AST 15 03/08/2021   ALKPHOS 64 03/08/2021   BILITOT 0.2 03/08/2021    Lab Results  Component Value Date   TSH 2.01 06/15/2019     Assessment:   No diagnosis found.   Plan:  Blood tests: {blood tests:13147}. Breast self exam technique reviewed and patient encouraged to perform self-exam monthly. Contraception: {contraceptive methods:5051}. Discussed healthy lifestyle modifications. Mammogram {discussed/ordered:14545} Pap smear {discussed/ordered:14545}. COVID vaccination status: Follow up in 1 year for annual exam   Landis Gandy, [facility]

## 2022-07-09 ENCOUNTER — Ambulatory Visit (INDEPENDENT_AMBULATORY_CARE_PROVIDER_SITE_OTHER): Payer: BC Managed Care – PPO

## 2022-07-09 ENCOUNTER — Encounter: Payer: Self-pay | Admitting: Obstetrics and Gynecology

## 2022-07-09 ENCOUNTER — Ambulatory Visit (INDEPENDENT_AMBULATORY_CARE_PROVIDER_SITE_OTHER): Payer: BC Managed Care – PPO | Admitting: Obstetrics and Gynecology

## 2022-07-09 VITALS — BP 103/72 | HR 98 | Ht 62.0 in | Wt 191.4 lb

## 2022-07-09 DIAGNOSIS — Z01419 Encounter for gynecological examination (general) (routine) without abnormal findings: Secondary | ICD-10-CM

## 2022-07-09 DIAGNOSIS — J309 Allergic rhinitis, unspecified: Secondary | ICD-10-CM | POA: Diagnosis not present

## 2022-07-09 DIAGNOSIS — Z3041 Encounter for surveillance of contraceptive pills: Secondary | ICD-10-CM

## 2022-07-09 DIAGNOSIS — F419 Anxiety disorder, unspecified: Secondary | ICD-10-CM

## 2022-07-09 DIAGNOSIS — E669 Obesity, unspecified: Secondary | ICD-10-CM

## 2022-07-09 MED ORDER — NORETHIN ACE-ETH ESTRAD-FE 1-20 MG-MCG PO TABS: 1.0000 | ORAL_TABLET | Freq: Every day | ORAL | 3 refills | Status: DC

## 2022-07-09 NOTE — Progress Notes (Signed)
Patient in clinic today for her immunotherapy (allergy injection).  She presents today without her Epi-pen.  She was sent back to her office to retrieve it. Injections given today and tolerated well.  Injection site without redness and smaller than pea size induration.  Dahlia Bailiff, RN  **Vial will expire 07-11-22**   Patient plans to pick up her new vial from [facility] tomorrow 07-10-22 and bring to her new appointment

## 2022-07-10 LAB — COMPREHENSIVE METABOLIC PANEL
ALT: 12 IU/L (ref 0–32)
AST: 14 IU/L (ref 0–40)
Albumin/Globulin Ratio: 1.7 (ref 1.2–2.2)
Albumin: 4 g/dL (ref 3.9–4.9)
Alkaline Phosphatase: 64 IU/L (ref 44–121)
BUN/Creatinine Ratio: 16 (ref 9–23)
BUN: 15 mg/dL (ref 6–20)
Bilirubin Total: <0.2 mg/dL (ref 0.0–1.2)
CO2: 20 mmol/L (ref 20–29)
Calcium: 9.1 mg/dL (ref 8.7–10.2)
Chloride: 104 mmol/L (ref 96–106)
Creatinine, Ser: 0.92 mg/dL (ref 0.57–1.00)
Globulin, Total: 2.3 g/dL (ref 1.5–4.5)
Glucose: 83 mg/dL (ref 70–99)
Potassium: 4.1 mmol/L (ref 3.5–5.2)
Sodium: 140 mmol/L (ref 134–144)
Total Protein: 6.3 g/dL (ref 6.0–8.5)
eGFR: 84 mL/min/{1.73_m2} (ref >59)

## 2022-07-10 LAB — CBC
Hematocrit: 36.7 % (ref 34.0–46.6)
Hemoglobin: 12.4 g/dL (ref 11.1–15.9)
MCH: 30.8 pg (ref 26.6–33.0)
MCHC: 33.8 g/dL (ref 31.5–35.7)
MCV: 91 fL (ref 79–97)
Platelets: 412 10*3/uL (ref 150–450)
RBC: 4.02 x10E6/uL (ref 3.77–5.28)
RDW: 12.1 % (ref 11.7–15.4)
WBC: 5.8 10*3/uL (ref 3.4–10.8)

## 2022-07-10 LAB — TSH: TSH: 1.63 u[IU]/mL (ref 0.450–4.500)

## 2022-07-15 ENCOUNTER — Telehealth: Payer: Self-pay

## 2022-07-15 NOTE — Telephone Encounter (Signed)
Telephone call to [facility] today.  Spoke with Kathlee Nations  regarding the buildup dosing starting a new vial.  Patient should start at 0.1 mL and buildup to 0.3 mL, which is her maintenance dose.  Verified patient diagnosis code as well, which is J30.9 Allergic Rhinitis.  Dahlia Bailiff, RN

## 2022-07-16 ENCOUNTER — Ambulatory Visit (INDEPENDENT_AMBULATORY_CARE_PROVIDER_SITE_OTHER): Payer: BC Managed Care – PPO

## 2022-07-16 DIAGNOSIS — J309 Allergic rhinitis, unspecified: Secondary | ICD-10-CM

## 2022-07-16 DIAGNOSIS — Z2989 Encounter for other specified prophylactic measures: Secondary | ICD-10-CM

## 2022-07-16 NOTE — Progress Notes (Signed)
Patient in clinic today for her immunotherapy (allergy injection).  She brought with her the new serum vial from [facility].  Injection given today and tolerated well.  Injection site slightly red and smaller than pea size induration.  Next appointment already scheduled.  Dahlia Bailiff, RN

## 2022-07-23 ENCOUNTER — Ambulatory Visit (INDEPENDENT_AMBULATORY_CARE_PROVIDER_SITE_OTHER): Payer: BC Managed Care – PPO

## 2022-07-23 DIAGNOSIS — J309 Allergic rhinitis, unspecified: Secondary | ICD-10-CM | POA: Diagnosis not present

## 2022-07-23 NOTE — Progress Notes (Signed)
Patient in clinic today for her immunotherapy (allergy injection).  Injection given and tolerated well.  Injection site slightly red and smaller than pea size induration.  Patient took her vial and paperwork with her today so that dosing could be given at her allergist office next week during Spring Break.  She will return her vial and paper work at her next appointment 08-05-2022 at 3:40 pm.  Dahlia Bailiff, RN

## 2022-07-24 ENCOUNTER — Other Ambulatory Visit: Payer: Self-pay | Admitting: Family Medicine

## 2022-07-24 DIAGNOSIS — F331 Major depressive disorder, recurrent, moderate: Secondary | ICD-10-CM

## 2022-07-24 DIAGNOSIS — F411 Generalized anxiety disorder: Secondary | ICD-10-CM

## 2022-07-31 DIAGNOSIS — J301 Allergic rhinitis due to pollen: Secondary | ICD-10-CM | POA: Diagnosis not present

## 2022-07-31 NOTE — Progress Notes (Signed)
Name: Patricia Horn   MRN: [id number]    DOB: [birth year]   Date:08/01/2022       Progress Note  Subjective  Chief Complaint  Weight Management  HPI  Obesity: her weight in high school was around 130-135 lbs, that was her ideal body weight - BMI below 25. Around senior in high school through freshman year in college her weight went up to 200 lbs. After that her weight has been up and down Her weight was between 170-174 lbs from 2022 and May 2023. Up to 187 lbs in August and it spiked to 194 lbs in Nov, that was  at 24 lbs weight gain in one year . She states when really happy or really sad she does not plan her meals and eat for comfort. BMI is above 35. She resumed her personal training sessions Nov 2023 and was going to weight loss clinic and getting Ozempic shots, but since Feb she is getting Mancel Parsons ( now covered by insurance ) , 194 lbs and is down to 184.3 lbs. She is happy with results Her goal weight is 155 lbs.   Depression/ Major-recurrent currently mild,  GAD:   She came in to be seen in Nov and depression was worse with a Phq 9 of 16.  Shortly after the end of a relationship.  She was overeating to cope with the stress. It was a combination of stressors with the darker days since she has a history of seasonal affective disorder . We gave her Vraylar and Vyvanse , she states vyvanse kept her up and she never filled Vraylar, she is still seeing her therapist Ovid Curd, she is working on mindfulness, gratitude, getting grounded, taking time off social medial. Phq 9 is greatly improved. She is only taking duloxetine 90 mg daily and denies side effects    Insomnia: she is able to fall asleep but has interrupted sleep. She is taking hydroxizine prn. Unchanged    Allergies: she was seeing Dr. Virgia Land and having allergy shots but with job change she has not been able to take time off. Currently on Xyzal and singulair, asked if singulair maker her feel moody but she denied. May try taking xyzal in am and  hydroxizine at night to see.   Patient Active Problem List   Diagnosis Date Noted   MDD (major depressive disorder), recurrent episode, moderate ([place]) 10/22/2020   Environmental allergies 04/19/2020   Allergy to cats 04/19/2020   Uterine leiomyoma 03/02/2019   Bilateral hand pain 12/28/2017   Right carpal tunnel syndrome 11/10/2016   Urticaria 11/09/2015   Obesity (BMI 35.0-39.9 without comorbidity) Q000111Q   Umbilical cyst Q000111Q   Perennial allergic rhinitis 11/16/2014   GAD (generalized anxiety disorder) 10/06/2014   ANA positive 10/06/2014    Past Surgical History:  Procedure Laterality Date   HERNIA REPAIR  [id number]   umbilical    MYOMECTOMY  2015    Family History  Problem Relation Age of Onset   Hypertension Mother    Scleroderma Mother    Pulmonary fibrosis Mother    Asthma Mother    Cancer Neg Hx     Social History   Tobacco Use   Smoking status: Never   Smokeless tobacco: Never  Substance Use Topics   Alcohol use: Yes    Alcohol/week: 1.0 standard drink of alcohol    Types: 1 Standard drinks or equivalent per week    Comment: ocassional     Current Outpatient Medications:    DULoxetine (CYMBALTA)  30 MG capsule, TAKE 3 CAPSULES BY MOUTH ONCE DAILY, Disp: 270 capsule, Rfl: 0   EPINEPHrine 0.3 mg/0.3 mL IJ SOAJ injection, Inject 0.3 mg into the muscle as needed for anaphylaxis., Disp: 1 each, Rfl: 5   hydrOXYzine (ATARAX) 10 MG tablet, Take 1-2 tablets (10-20 mg total) by mouth every 4 (four) hours as needed., Disp: 120 tablet, Rfl: 2   levocetirizine (XYZAL) 5 MG tablet, Take 1 tablet (5 mg total) by mouth every evening., Disp: 90 tablet, Rfl: 1   montelukast (SINGULAIR) 10 MG tablet, Take 1 tablet (10 mg total) by mouth at bedtime., Disp: 90 tablet, Rfl: 1   [START ON 09/03/2022] norethindrone-ethinyl estradiol-FE (LARIN FE 1/20) 1-20 MG-MCG tablet, Take 1 tablet by mouth daily., Disp: 84 tablet, Rfl: 3   Olopatadine-Mometasone (RYALTRIS) 665-25 MCG/ACT  SUSP, Place 2 sprays into the nose 2 (two) times daily., Disp: 29 g, Rfl: 1  Allergies  Allergen Reactions   Peanut Oil Swelling    Eyelids swell.   Peanut-Containing Drug Products     I personally reviewed active problem list, medication list, allergies, family history, social history, health maintenance with the patient/caregiver today.   ROS  Constitutional: Negative for fever , positive for weight change.  Respiratory: Negative for cough and shortness of breath.   Cardiovascular: Negative for chest pain or palpitations.  Gastrointestinal: Negative for abdominal pain, no bowel changes.  Musculoskeletal: Negative for gait problem or joint swelling.  Skin: Negative for rash.  Neurological: Negative for dizziness or headache.  No other specific complaints in a complete review of systems (except as listed in HPI above).   Objective  Vitals:   08/01/22 1328  BP: 108/70  Pulse: 89  Resp: 14  Temp: 98.1 F (36.7 C)  TempSrc: Oral  SpO2: 100%  Weight: 184 lb 11.2 oz (83.8 kg)  Height: 5\' 2"  (1.575 m)    Body mass index is 33.78 kg/m.  Physical Exam  Constitutional: Patient appears well-developed and well-nourished. Obese  No distress.  HEENT: head atraumatic, normocephalic, pupils equal and reactive to light, neck supple Cardiovascular: Normal rate, regular rhythm and normal heart sounds.  No murmur heard. No BLE edema. Pulmonary/Chest: Effort normal and breath sounds normal. No respiratory distress. Abdominal: Soft.  There is no tenderness. Psychiatric: Patient has a normal mood and affect. behavior is normal. Judgment and thought content normal.   Recent Results (from the past 2160 hour(s))  TSH     Status: None   Collection Time: 07/09/22  8:42 AM  Result Value Ref Range   TSH 1.630 0.450 - 4.500 uIU/mL  CBC     Status: None   Collection Time: 07/09/22  8:42 AM  Result Value Ref Range   WBC 5.8 3.4 - 10.8 x10E3/uL   RBC 4.02 3.77 - 5.28 x10E6/uL   Hemoglobin  12.4 11.1 - 15.9 g/dL   Hematocrit 36.7 34.0 - 46.6 %   MCV 91 79 - 97 fL   MCH 30.8 26.6 - 33.0 pg   MCHC 33.8 31.5 - 35.7 g/dL   RDW 12.1 11.7 - 15.4 %   Platelets 412 150 - 450 x10E3/uL  Comp Met (CMET)     Status: None   Collection Time: 07/09/22  8:42 AM  Result Value Ref Range   Glucose 83 70 - 99 mg/dL   BUN 15 6 - 20 mg/dL   Creatinine, Ser 0.92 0.57 - 1.00 mg/dL   eGFR 84 >59 mL/min/1.73   BUN/Creatinine Ratio 16 9 - 23  Sodium 140 134 - 144 mmol/L   Potassium 4.1 3.5 - 5.2 mmol/L   Chloride 104 96 - 106 mmol/L   CO2 20 20 - 29 mmol/L   Calcium 9.1 8.7 - 10.2 mg/dL   Total Protein 6.3 6.0 - 8.5 g/dL   Albumin 4.0 3.9 - 4.9 g/dL   Globulin, Total 2.3 1.5 - 4.5 g/dL   Albumin/Globulin Ratio 1.7 1.2 - 2.2   Bilirubin Total <0.2 0.0 - 1.2 mg/dL   Alkaline Phosphatase 64 44 - 121 IU/L   AST 14 0 - 40 IU/L   ALT 12 0 - 32 IU/L    PHQ2/9:    08/01/2022    1:30 PM 03/25/2022    8:57 AM 03/21/2022    8:44 AM 12/23/2021    9:42 AM 09/24/2021    8:49 AM  Depression screen PHQ 2/9  Decreased Interest 1 3 0 1 1  Down, Depressed, Hopeless 1 3 0 0 1  PHQ - 2 Score 2 6 0 1 2  Altered sleeping 0 3 0 1 3  Tired, decreased energy 1 3 0 3 3  Change in appetite 0 3 0 1 1  Feeling bad or failure about yourself  0 0 0 0 1  Trouble concentrating 0 1 0 3 1  Moving slowly or fidgety/restless 0 0 0 0 0  Suicidal thoughts 0 0 0 0 1  PHQ-9 Score 3 16 0 9 12  Difficult doing work/chores Not difficult at all        phq 9 is positive   Fall Risk:    08/01/2022    1:30 PM 03/25/2022    8:57 AM 03/21/2022    8:44 AM 01/02/2022    8:49 AM 12/23/2021    9:42 AM  Fall Risk   Falls in the past year? 0 0 0 0 0  Number falls in past yr:  0  0 0  Injury with Fall?  0  0 0  Risk for fall due to : No Fall Risks No Fall Risks No Fall Risks No Fall Risks No Fall Risks  Follow up Falls prevention discussed Falls prevention discussed Falls prevention discussed Falls evaluation completed  Falls prevention discussed      Functional Status Survey: Is the patient deaf or have difficulty hearing?: No Does the patient have difficulty seeing, even when wearing glasses/contacts?: No Does the patient have difficulty concentrating, remembering, or making decisions?: No Does the patient have difficulty walking or climbing stairs?: No Does the patient have difficulty dressing or bathing?: No Does the patient have difficulty doing errands alone such as visiting a doctor's office or shopping?: No    Assessment & Plan  1. Mild episode of recurrent major depressive disorder (HCC)  Continue duloxetine, doing well   2. Perennial allergic rhinitis   3. Obesity (BMI 35.0-39.9 without comorbidity)  - Semaglutide-Weight Management (WEGOVY) 2.4 MG/0.75ML SOAJ; Inject 2.4 mg into the skin once a week.  Dispense: 9 mL; Refill: 0

## 2022-08-01 ENCOUNTER — Encounter: Payer: Self-pay | Admitting: Family Medicine

## 2022-08-01 ENCOUNTER — Ambulatory Visit: Payer: BC Managed Care – PPO | Admitting: Family Medicine

## 2022-08-01 VITALS — BP 108/70 | HR 89 | Temp 98.1°F | Resp 14 | Ht 62.0 in | Wt 184.7 lb

## 2022-08-01 DIAGNOSIS — J3089 Other allergic rhinitis: Secondary | ICD-10-CM | POA: Diagnosis not present

## 2022-08-01 DIAGNOSIS — F330 Major depressive disorder, recurrent, mild: Secondary | ICD-10-CM

## 2022-08-01 DIAGNOSIS — E669 Obesity, unspecified: Secondary | ICD-10-CM

## 2022-08-01 MED ORDER — WEGOVY 2.4 MG/0.75ML ~~LOC~~ SOAJ: 2.4000 mg | SUBCUTANEOUS | 0 refills | Status: DC

## 2022-08-05 ENCOUNTER — Ambulatory Visit (INDEPENDENT_AMBULATORY_CARE_PROVIDER_SITE_OTHER): Payer: BC Managed Care – PPO

## 2022-08-05 DIAGNOSIS — J309 Allergic rhinitis, unspecified: Secondary | ICD-10-CM

## 2022-08-05 DIAGNOSIS — Z2989 Encounter for other specified prophylactic measures: Secondary | ICD-10-CM | POA: Diagnosis not present

## 2022-08-05 NOTE — Progress Notes (Signed)
Patient in clinic today for her immunotherapy (allergy injection).  Injection given and tolerated well.  Injection site slightly red and less than pea size induration.  Next appointment will be scheduled today before leaving. Theodis Sato, RN

## 2022-08-12 ENCOUNTER — Ambulatory Visit (INDEPENDENT_AMBULATORY_CARE_PROVIDER_SITE_OTHER): Payer: BC Managed Care – PPO

## 2022-08-12 DIAGNOSIS — Z2989 Encounter for other specified prophylactic measures: Secondary | ICD-10-CM

## 2022-08-12 DIAGNOSIS — J309 Allergic rhinitis, unspecified: Secondary | ICD-10-CM

## 2022-08-12 NOTE — Progress Notes (Signed)
Patient in clinic today for her immunotherapy (allergy injection).  Injection given and tolerated well.  Injection site only slightly red and induration smaller that pea size.  Appointment will be made for next injection prior to leaving today. Reece Packer, RN

## 2022-08-19 ENCOUNTER — Ambulatory Visit (INDEPENDENT_AMBULATORY_CARE_PROVIDER_SITE_OTHER): Payer: BC Managed Care – PPO

## 2022-08-19 DIAGNOSIS — F331 Major depressive disorder, recurrent, moderate: Secondary | ICD-10-CM | POA: Diagnosis not present

## 2022-08-19 DIAGNOSIS — J3081 Allergic rhinitis due to animal (cat) (dog) hair and dander: Secondary | ICD-10-CM | POA: Diagnosis not present

## 2022-08-19 DIAGNOSIS — Z2989 Encounter for other specified prophylactic measures: Secondary | ICD-10-CM | POA: Diagnosis not present

## 2022-08-19 NOTE — Progress Notes (Signed)
Patient in clinic today for immunotherapy (allergy injection).  Injection given in left arm and tolerated well.  Injection site only slightly red and induration smaller than pea size.  Will make next appointment prior to leaving today. Reece Packer, RN

## 2022-08-26 ENCOUNTER — Ambulatory Visit (INDEPENDENT_AMBULATORY_CARE_PROVIDER_SITE_OTHER): Payer: BC Managed Care – PPO

## 2022-08-26 DIAGNOSIS — J309 Allergic rhinitis, unspecified: Secondary | ICD-10-CM

## 2022-08-26 DIAGNOSIS — Z2989 Encounter for other specified prophylactic measures: Secondary | ICD-10-CM | POA: Diagnosis not present

## 2022-08-26 NOTE — Progress Notes (Signed)
Patient in clinic today for immunotherapy (allergy injection).  Injection given and tolerated well.  Injection site only slightly red and induration smaller than pea size.  Will schedule next appointment prior to leaving today. Reece Packer, RN

## 2022-09-02 ENCOUNTER — Ambulatory Visit (INDEPENDENT_AMBULATORY_CARE_PROVIDER_SITE_OTHER): Payer: BC Managed Care – PPO

## 2022-09-02 DIAGNOSIS — Z2989 Encounter for other specified prophylactic measures: Secondary | ICD-10-CM | POA: Diagnosis not present

## 2022-09-02 DIAGNOSIS — J309 Allergic rhinitis, unspecified: Secondary | ICD-10-CM | POA: Diagnosis not present

## 2022-09-02 NOTE — Progress Notes (Signed)
Patient in clinic today for her immunotherapy (allergy injection).  Injections given and tolerated well.  Injection site slightly red and induration smaller than pea size.  Next appointment already on the schedule.Reece Packer, RN

## 2022-09-11 ENCOUNTER — Ambulatory Visit (INDEPENDENT_AMBULATORY_CARE_PROVIDER_SITE_OTHER): Payer: BC Managed Care – PPO

## 2022-09-11 DIAGNOSIS — Z2989 Encounter for other specified prophylactic measures: Secondary | ICD-10-CM | POA: Diagnosis not present

## 2022-09-11 DIAGNOSIS — J309 Allergic rhinitis, unspecified: Secondary | ICD-10-CM

## 2022-09-11 NOTE — Progress Notes (Signed)
Patient in clinic today for immunotherapy (allergy injection).  Injection given and tolerated well.  Injection site only slightly red and induration smaller than pea size.  Next appointment already on the calendar.  Reece Packer, RN

## 2022-09-18 ENCOUNTER — Ambulatory Visit (INDEPENDENT_AMBULATORY_CARE_PROVIDER_SITE_OTHER): Payer: BC Managed Care – PPO

## 2022-09-18 DIAGNOSIS — Z2989 Encounter for other specified prophylactic measures: Secondary | ICD-10-CM | POA: Diagnosis not present

## 2022-09-18 DIAGNOSIS — J309 Allergic rhinitis, unspecified: Secondary | ICD-10-CM

## 2022-09-18 NOTE — Progress Notes (Signed)
Patient in clinic today for immunotherapy (allergy injection).  Injection given in arm and tolerated well.  Injection site slightly red but induration smaller than pea size.  Next appointment already on the calendar.  Reece Packer, RN

## 2022-09-23 ENCOUNTER — Encounter: Payer: Self-pay | Admitting: Family Medicine

## 2022-09-23 ENCOUNTER — Ambulatory Visit (INDEPENDENT_AMBULATORY_CARE_PROVIDER_SITE_OTHER): Payer: BC Managed Care – PPO

## 2022-09-23 DIAGNOSIS — Z2989 Encounter for other specified prophylactic measures: Secondary | ICD-10-CM

## 2022-09-23 DIAGNOSIS — J301 Allergic rhinitis due to pollen: Secondary | ICD-10-CM | POA: Diagnosis not present

## 2022-09-23 DIAGNOSIS — J309 Allergic rhinitis, unspecified: Secondary | ICD-10-CM | POA: Diagnosis not present

## 2022-09-23 NOTE — Progress Notes (Signed)
Patient in clinic today for her immunotherapy (allergy injection).  Injection given in right arm and tolerated well.  Injection site slightly red and induration smaller than pea size.  Will schedule next injection prior to leaving the clinic.  Reece Packer, RN

## 2022-10-01 DIAGNOSIS — J301 Allergic rhinitis due to pollen: Secondary | ICD-10-CM | POA: Diagnosis not present

## 2022-10-08 DIAGNOSIS — J301 Allergic rhinitis due to pollen: Secondary | ICD-10-CM | POA: Diagnosis not present

## 2022-10-15 DIAGNOSIS — J301 Allergic rhinitis due to pollen: Secondary | ICD-10-CM | POA: Diagnosis not present

## 2022-10-22 DIAGNOSIS — J301 Allergic rhinitis due to pollen: Secondary | ICD-10-CM | POA: Diagnosis not present

## 2022-10-23 ENCOUNTER — Other Ambulatory Visit: Payer: Self-pay | Admitting: Family Medicine

## 2022-10-23 DIAGNOSIS — F411 Generalized anxiety disorder: Secondary | ICD-10-CM

## 2022-10-23 DIAGNOSIS — F331 Major depressive disorder, recurrent, moderate: Secondary | ICD-10-CM

## 2022-10-29 DIAGNOSIS — J301 Allergic rhinitis due to pollen: Secondary | ICD-10-CM | POA: Diagnosis not present

## 2022-10-30 NOTE — Progress Notes (Signed)
Name: Patricia Horn   MRN: [id number]    DOB: May 01, 1991   Date:10/31/2022       Progress Note  Subjective  Chief Complaint  Follow Up  HPI  Obesity: her weight in high school was around 130-135 lbs, that was her ideal body weight - BMI below 25. Around senior in high school through freshman year in college her weight went up to 200 lbs. After that her weight has been up and down Her weight was between 170-174 lbs from 2022 and May 2023. Up to 187 lbs in August and it spiked to 194 lbs in Nov, that was  at 24 lbs weight gain in one year . BMI is above 35. She resumed her personal training sessions Nov 2023 and was going to weight loss clinic and getting Ozempic shots, but since Feb she is getting Reginal Lutes ( now covered by insurance ) , 194 lbs and is down to 169 lbs, currently BMI is down to 30.91 . She is happy with results Her goal weight is 155 lbs. She states more active during Summer months. She still likes to eat chick filet and fries , discussed healthier snacks   Depression/ Major-recurrent currently mild,  GAD:   She came in to be seen in Nov and depression was worse with a Phq 9 of 16.  Shortly after the end of a relationship.  She was overeating to cope with the stress. It was a combination of stressors with the darker days since she has a history of seasonal affective disorder . We gave her Vraylar and Vyvanse , she states vyvanse kept her up and she never filled Vraylar, she is still seeing her therapist Harrold Donath, she is working on mindfulness, gratitude, getting grounded, she takes time off social media intermittently, doing more things outdoors. Phq 9 is greatly improved. She is only taking duloxetine 90 mg daily and denies side effects    Insomnia: she is able to fall asleep but has interrupted sleep. She is taking hydroxizine prn. Unchanged    Allergies: she was seeing Dr. Wardell Heath and having allergy shots but with job change she has not been able to take time off. Currently on Xyzal and  singulair, asked if singulair maker her feel moody but she denied. Unchanged   Patient Active Problem List   Diagnosis Date Noted   MDD (major depressive disorder), recurrent episode, moderate (HCC) 10/22/2020   Environmental allergies 04/19/2020   Allergy to cats 04/19/2020   Uterine leiomyoma 03/02/2019   Bilateral hand pain 12/28/2017   Right carpal tunnel syndrome 11/10/2016   Urticaria 11/09/2015   Obesity (BMI 35.0-39.9 without comorbidity) 01/05/2015   Umbilical cyst 11/30/2014   Perennial allergic rhinitis 11/16/2014   GAD (generalized anxiety disorder) 10/06/2014   ANA positive 10/06/2014    Past Surgical History:  Procedure Laterality Date   HERNIA REPAIR  1995   umbilical    MYOMECTOMY  2015    Family History  Problem Relation Age of Onset   Hypertension Mother    Scleroderma Mother    Pulmonary fibrosis Mother    Asthma Mother    Cancer Neg Hx     Social History   Tobacco Use   Smoking status: Never   Smokeless tobacco: Never  Substance Use Topics   Alcohol use: Yes    Alcohol/week: 1.0 standard drink of alcohol    Types: 1 Standard drinks or equivalent per week    Comment: ocassional     Current Outpatient Medications:  DULoxetine (CYMBALTA) 30 MG capsule, TAKE 3 CAPSULES BY MOUTH ONCE DAILY, Disp: 270 capsule, Rfl: 0   EPINEPHrine 0.3 mg/0.3 mL IJ SOAJ injection, Inject 0.3 mg into the muscle as needed for anaphylaxis., Disp: 1 each, Rfl: 5   hydrOXYzine (ATARAX) 10 MG tablet, Take 1-2 tablets (10-20 mg total) by mouth every 4 (four) hours as needed., Disp: 120 tablet, Rfl: 2   levocetirizine (XYZAL) 5 MG tablet, Take 1 tablet (5 mg total) by mouth every evening., Disp: 90 tablet, Rfl: 1   norethindrone-ethinyl estradiol-FE (LARIN FE 1/20) 1-20 MG-MCG tablet, Take 1 tablet by mouth daily., Disp: 84 tablet, Rfl: 3   Semaglutide-Weight Management (WEGOVY) 2.4 MG/0.75ML SOAJ, Inject 2.4 mg into the skin once a week., Disp: 9 mL, Rfl: 0   montelukast  (SINGULAIR) 10 MG tablet, Take 1 tablet (10 mg total) by mouth at bedtime. (Patient not taking: Reported on 10/31/2022), Disp: 90 tablet, Rfl: 1   Olopatadine-Mometasone (RYALTRIS) 665-25 MCG/ACT SUSP, Place 2 sprays into the nose 2 (two) times daily. (Patient not taking: Reported on 10/31/2022), Disp: 29 g, Rfl: 1  Allergies  Allergen Reactions   Peanut Oil Swelling    Eyelids swell.   Peanut-Containing Drug Products     I personally reviewed active problem list, medication list, allergies, family history, social history, health maintenance with the patient/caregiver today.   ROS  Constitutional: Negative for fever, positive for  weight change.  Respiratory: Negative for cough and shortness of breath.   Cardiovascular: Negative for chest pain or palpitations.  Gastrointestinal: Negative for abdominal pain, no bowel changes.  Musculoskeletal: Negative for gait problem or joint swelling.  Skin: Negative for rash.  Neurological: Negative for dizziness or headache.  No other specific complaints in a complete review of systems (except as listed in HPI above).   Objective  Vitals:   10/31/22 1533  BP: 102/68  Pulse: 94  Resp: 16  SpO2: 98%  Weight: 169 lb (76.7 kg)  Height: 5\' 2"  (1.575 m)    Body mass index is 30.91 kg/m.  Physical Exam  Constitutional: Patient appears well-developed and well-nourished. Obese  No distress.  HEENT: head atraumatic, normocephalic, pupils equal and reactive to light, neck supple Cardiovascular: Normal rate, regular rhythm and normal heart sounds.  No murmur heard. No BLE edema. Pulmonary/Chest: Effort normal and breath sounds normal. No respiratory distress. Abdominal: Soft.  There is no tenderness. Psychiatric: Patient has a normal mood and affect. behavior is normal. Judgment and thought content normal.   PHQ2/9:    10/31/2022    3:33 PM 08/01/2022    1:30 PM 03/25/2022    8:57 AM 03/21/2022    8:44 AM 12/23/2021    9:42 AM  Depression  screen PHQ 2/9  Decreased Interest 0 1 3 0 1  Down, Depressed, Hopeless 0 1 3 0 0  PHQ - 2 Score 0 2 6 0 1  Altered sleeping 0 0 3 0 1  Tired, decreased energy 3 1 3  0 3  Change in appetite 0 0 3 0 1  Feeling bad or failure about yourself  0 0 0 0 0  Trouble concentrating 0 0 1 0 3  Moving slowly or fidgety/restless 0 0 0 0 0  Suicidal thoughts 0 0 0 0 0  PHQ-9 Score 3 3 16  0 9  Difficult doing work/chores  Not difficult at all       phq 9 is negative   Fall Risk:    10/31/2022  3:33 PM 08/01/2022    1:30 PM 03/25/2022    8:57 AM 03/21/2022    8:44 AM 01/02/2022    8:49 AM  Fall Risk   Falls in the past year? 0 0 0 0 0  Number falls in past yr: 0  0  0  Injury with Fall? 0  0  0  Risk for fall due to : No Fall Risks No Fall Risks No Fall Risks No Fall Risks No Fall Risks  Follow up Falls prevention discussed Falls prevention discussed Falls prevention discussed Falls prevention discussed Falls evaluation completed     Functional Status Survey: Is the patient deaf or have difficulty hearing?: No Does the patient have difficulty seeing, even when wearing glasses/contacts?: No Does the patient have difficulty concentrating, remembering, or making decisions?: No Does the patient have difficulty walking or climbing stairs?: No Does the patient have difficulty dressing or bathing?: No Does the patient have difficulty doing errands alone such as visiting a doctor's office or shopping?: No    Assessment & Plan  1. GAD (generalized anxiety disorder)  - DULoxetine (CYMBALTA) 30 MG capsule; Take 3 capsules (90 mg total) by mouth daily.  Dispense: 270 capsule; Refill: 0  2. MDD (major depressive disorder), recurrent episode, moderate (HCC)  - DULoxetine (CYMBALTA) 30 MG capsule; Take 3 capsules (90 mg total) by mouth daily.  Dispense: 270 capsule; Refill: 0  3. Perennial allergic rhinitis  - levocetirizine (XYZAL) 5 MG tablet; Take 1 tablet (5 mg total) by mouth every  evening.  Dispense: 90 tablet; Refill: 1  4. Allergy to cats  - levocetirizine (XYZAL) 5 MG tablet; Take 1 tablet (5 mg total) by mouth every evening.  Dispense: 90 tablet; Refill: 1  5. Obesity (BMI 30.0-34.9)  - Semaglutide-Weight Management (WEGOVY) 2.4 MG/0.75ML SOAJ; Inject 2.4 mg into the skin once a week.  Dispense: 9 mL; Refill: 0

## 2022-10-31 ENCOUNTER — Ambulatory Visit: Payer: BC Managed Care – PPO | Admitting: Family Medicine

## 2022-10-31 ENCOUNTER — Encounter: Payer: Self-pay | Admitting: Family Medicine

## 2022-10-31 DIAGNOSIS — F411 Generalized anxiety disorder: Secondary | ICD-10-CM

## 2022-10-31 DIAGNOSIS — E669 Obesity, unspecified: Secondary | ICD-10-CM

## 2022-10-31 DIAGNOSIS — J3089 Other allergic rhinitis: Secondary | ICD-10-CM | POA: Diagnosis not present

## 2022-10-31 DIAGNOSIS — J3081 Allergic rhinitis due to animal (cat) (dog) hair and dander: Secondary | ICD-10-CM | POA: Diagnosis not present

## 2022-10-31 DIAGNOSIS — F331 Major depressive disorder, recurrent, moderate: Secondary | ICD-10-CM

## 2022-10-31 MED ORDER — DULOXETINE HCL 30 MG PO CPEP
90.0000 mg | ORAL_CAPSULE | Freq: Every day | ORAL | 0 refills | Status: DC
Start: 2022-10-31 — End: 2023-01-28

## 2022-10-31 MED ORDER — LEVOCETIRIZINE DIHYDROCHLORIDE 5 MG PO TABS
5.0000 mg | ORAL_TABLET | Freq: Every evening | ORAL | 1 refills | Status: AC
Start: 2022-10-31 — End: ?

## 2022-10-31 MED ORDER — WEGOVY 2.4 MG/0.75ML ~~LOC~~ SOAJ
2.4000 mg | SUBCUTANEOUS | 0 refills | Status: DC
Start: 2022-10-31 — End: 2022-11-06

## 2022-11-03 ENCOUNTER — Telehealth: Payer: Self-pay

## 2022-11-03 NOTE — Telephone Encounter (Signed)
PA initiated for Wegovy 

## 2022-11-05 ENCOUNTER — Other Ambulatory Visit: Payer: Self-pay | Admitting: Family Medicine

## 2022-11-05 DIAGNOSIS — J301 Allergic rhinitis due to pollen: Secondary | ICD-10-CM | POA: Diagnosis not present

## 2022-11-05 DIAGNOSIS — E669 Obesity, unspecified: Secondary | ICD-10-CM

## 2022-11-12 DIAGNOSIS — J301 Allergic rhinitis due to pollen: Secondary | ICD-10-CM | POA: Diagnosis not present

## 2022-11-19 DIAGNOSIS — J301 Allergic rhinitis due to pollen: Secondary | ICD-10-CM | POA: Diagnosis not present

## 2022-11-26 DIAGNOSIS — J301 Allergic rhinitis due to pollen: Secondary | ICD-10-CM | POA: Diagnosis not present

## 2022-11-27 ENCOUNTER — Encounter: Payer: Self-pay | Admitting: Adult Health

## 2022-11-27 ENCOUNTER — Ambulatory Visit (INDEPENDENT_AMBULATORY_CARE_PROVIDER_SITE_OTHER): Payer: Self-pay | Admitting: Adult Health

## 2022-11-27 VITALS — BP 108/76 | HR 97 | Temp 97.0°F | Resp 14 | Wt 165.0 lb

## 2022-11-27 DIAGNOSIS — B349 Viral infection, unspecified: Secondary | ICD-10-CM

## 2022-11-27 DIAGNOSIS — J029 Acute pharyngitis, unspecified: Secondary | ICD-10-CM

## 2022-11-27 LAB — POCT RAPID STREP A (OFFICE): Rapid Strep A Screen: NEGATIVE

## 2022-11-27 NOTE — Progress Notes (Signed)
[facility] [address] [place], [place] [address]   Office Visit Note  Patient Name: Patricia Horn Date of Birth [birth year]  Medical Record number [id number]  Date of Service: 11/27/2022  Chief Complaint  Patient presents with   Sore Throat     HPI Pt is here for a sick visit. Pt reports she began to feel bad 2 days ago.  She describes headache, sore throat and ear pressure.  The throat is scratchy and has pain with swallowing in the morning.  She has multiple coworkers out sick, one has confirmed covid.  She has taken Benadryl at night for the headache. Denis any other medication use.    Current Medication:  Outpatient Encounter Medications as of 11/27/2022  Medication Sig   DULoxetine (CYMBALTA) 30 MG capsule Take 3 capsules (90 mg total) by mouth daily.   EPINEPHrine 0.3 mg/0.3 mL IJ SOAJ injection Inject 0.3 mg into the muscle as needed for anaphylaxis.   hydrOXYzine (ATARAX) 10 MG tablet Take 1-2 tablets (10-20 mg total) by mouth every 4 (four) hours as needed.   levocetirizine (XYZAL) 5 MG tablet Take 1 tablet (5 mg total) by mouth every evening.   norethindrone-ethinyl estradiol-FE (LARIN FE 1/20) 1-20 MG-MCG tablet Take 1 tablet by mouth daily.   WEGOVY 2.4 MG/0.75ML SOAJ INJECT 2.4 MG SUBCUTANEOUSLY ONCE A WEEK   montelukast (SINGULAIR) 10 MG tablet Take 1 tablet (10 mg total) by mouth at bedtime. (Patient not taking: Reported on 11/27/2022)   Olopatadine-Mometasone (RYALTRIS) 665-25 MCG/ACT SUSP Place 2 sprays into the nose 2 (two) times daily. (Patient not taking: Reported on 11/27/2022)   No facility-administered encounter medications on file as of 11/27/2022.      Medical History: Past Medical History:  Diagnosis Date   Anxiety    Depression    Environmental allergies    Fibroid uterus    Tendinitis    Umbilical hernia    Uterine leiomyoma 03/02/2019     Vital Signs: BP 108/76   Pulse 97   Temp (!) 97 F (36.1 C) (Tympanic)   Resp 14   Wt  165 lb (74.8 kg)   SpO2 98%   BMI 30.18 kg/m    Review of Systems  Constitutional:  Negative for chills and fever.  HENT:  Positive for congestion, ear pain and sore throat.   Eyes:  Negative for pain, redness and itching.  Respiratory:  Negative for cough.   Cardiovascular:  Negative for chest pain.  Gastrointestinal:  Negative for diarrhea, nausea and vomiting.  Musculoskeletal:  Positive for myalgias.  Neurological:  Positive for headaches.    Physical Exam Vitals reviewed.  Constitutional:      Appearance: She is well-developed.  HENT:     Head: Normocephalic.     Right Ear: Tympanic membrane and ear canal normal.     Left Ear: Tympanic membrane and ear canal normal.     Mouth/Throat:     Mouth: Mucous membranes are moist.     Tonsils: No tonsillar exudate.  Pulmonary:     Effort: Pulmonary effort is normal.     Breath sounds: Normal breath sounds.  Musculoskeletal:     Cervical back: Normal range of motion.  Neurological:     Mental Status: She is alert.    Results for orders placed or performed in visit on 11/27/22 (from the past 24 hour(s))  POCT Rapid Strep A-Office (CPT 75102)     Status: Normal   Collection Time: 11/27/22  9:34 AM  Result Value Ref Range   Rapid Strep A Screen Negative Negative    Assessment/Plan: 1. Viral illness Rest, and drink plenty of water.  Use cough drops, gargle warm sal water or drink wam liquids (like tea with honey) as needed for cough/throat irritation.   Take over-the-counter medicines (such as Dayquil or Nyquil) as discussed at your visit to help manage your symptoms.  Send a MyChart message to the provider or schedule a return appointment as needed for new/worsening symptoms (especially shortness of breath or chest pain) or if symptoms not improving with recommended treatment over the next 5-7 days.      2. Sore throat - POCT Rapid Strep A-Office (CPT [phone]) - POC SARS Coronavirus 2 Ag     General Counseling: Iliyah  verbalizes understanding of the findings of todays visit and agrees with plan of treatment. I have discussed any further diagnostic evaluation that may be needed or ordered today. We also reviewed her medications today. she has been encouraged to call the office with any questions or concerns that should arise related to todays visit.   Orders Placed This Encounter  Procedures   POCT Rapid Strep A-Office (CPT [phone])   POC SARS Coronavirus 2 Ag    No orders of the defined types were placed in this encounter.   Time spent:20 Minutes    Johnna Acosta AGNP-C Nurse Practitioner

## 2022-12-03 DIAGNOSIS — J301 Allergic rhinitis due to pollen: Secondary | ICD-10-CM | POA: Diagnosis not present

## 2022-12-08 ENCOUNTER — Encounter: Payer: Self-pay | Admitting: Family Medicine

## 2022-12-10 DIAGNOSIS — J301 Allergic rhinitis due to pollen: Secondary | ICD-10-CM | POA: Diagnosis not present

## 2022-12-15 DIAGNOSIS — J301 Allergic rhinitis due to pollen: Secondary | ICD-10-CM | POA: Diagnosis not present

## 2022-12-17 ENCOUNTER — Ambulatory Visit (INDEPENDENT_AMBULATORY_CARE_PROVIDER_SITE_OTHER): Payer: BC Managed Care – PPO

## 2022-12-17 DIAGNOSIS — J309 Allergic rhinitis, unspecified: Secondary | ICD-10-CM

## 2022-12-17 NOTE — Progress Notes (Signed)
Pt has epi pen for this appointment, verbalizes how and when to correctly use epi pen. Pt states she "used to have a small bump at the injection site in the past, now I don't get reactions." No symptoms or wheal on skin after 30 mins.

## 2022-12-24 NOTE — Progress Notes (Unsigned)
Name: Patricia Horn   MRN: [id number]    DOB: Jan 12, 1991   Date:12/25/2022       Progress Note  Subjective  Chief Complaint  Acid Reflux/ Diarrhea  HPI  Dyspepsia: patient has been on the same dose of Wegovy for months, she states has intermittent nausea and vomiting with medication, however over the past two weeks she wakes up with regurgitation , has epigastric pain and lower abdominal cramping associated with diarrhea. No fever or chills. She has been sexually active and takes ocp, sometimes does not use condoms.   Patient Active Problem List   Diagnosis Date Noted   MDD (major depressive disorder), recurrent episode, moderate (HCC) 10/22/2020   Environmental allergies 04/19/2020   Allergy to cats 04/19/2020   Uterine leiomyoma 03/02/2019   Bilateral hand pain 12/28/2017   Right carpal tunnel syndrome 11/10/2016   Urticaria 11/09/2015   Obesity (BMI 35.0-39.9 without comorbidity) 01/05/2015   Umbilical cyst 11/30/2014   Perennial allergic rhinitis 11/16/2014   GAD (generalized anxiety disorder) 10/06/2014   ANA positive 10/06/2014    Past Surgical History:  Procedure Laterality Date   HERNIA REPAIR  1995   umbilical    MYOMECTOMY  2015    Family History  Problem Relation Age of Onset   Hypertension Mother    Scleroderma Mother    Pulmonary fibrosis Mother    Asthma Mother    Cancer Neg Hx     Social History   Tobacco Use   Smoking status: Never   Smokeless tobacco: Never  Substance Use Topics   Alcohol use: Yes    Alcohol/week: 1.0 standard drink of alcohol    Types: 1 Standard drinks or equivalent per week    Comment: ocassional     Current Outpatient Medications:    DULoxetine (CYMBALTA) 30 MG capsule, Take 3 capsules (90 mg total) by mouth daily., Disp: 270 capsule, Rfl: 0   EPINEPHrine 0.3 mg/0.3 mL IJ SOAJ injection, Inject 0.3 mg into the muscle as needed for anaphylaxis., Disp: 1 each, Rfl: 5   hydrOXYzine (ATARAX) 10 MG tablet, Take 1-2 tablets  (10-20 mg total) by mouth every 4 (four) hours as needed., Disp: 120 tablet, Rfl: 2   hyoscyamine (LEVSIN/SL) 0.125 MG SL tablet, Place 1 tablet (0.125 mg total) under the tongue every 4 (four) hours as needed., Disp: 30 tablet, Rfl: 0   levocetirizine (XYZAL) 5 MG tablet, Take 1 tablet (5 mg total) by mouth every evening., Disp: 90 tablet, Rfl: 1   norethindrone-ethinyl estradiol-FE (LARIN FE 1/20) 1-20 MG-MCG tablet, Take 1 tablet by mouth daily., Disp: 84 tablet, Rfl: 3   omeprazole (PRILOSEC) 40 MG capsule, Take 1 capsule (40 mg total) by mouth daily., Disp: 30 capsule, Rfl: 0   WEGOVY 2.4 MG/0.75ML SOAJ, INJECT 2.4 MG SUBCUTANEOUSLY ONCE A WEEK, Disp: 12 mL, Rfl: 0   montelukast (SINGULAIR) 10 MG tablet, Take 1 tablet (10 mg total) by mouth at bedtime. (Patient not taking: Reported on 11/27/2022), Disp: 90 tablet, Rfl: 1   Olopatadine-Mometasone (RYALTRIS) 665-25 MCG/ACT SUSP, Place 2 sprays into the nose 2 (two) times daily. (Patient not taking: Reported on 11/27/2022), Disp: 29 g, Rfl: 1  Allergies  Allergen Reactions   Peanut Oil Swelling    Eyelids swell.   Peanut-Containing Drug Products     I personally reviewed active problem list, medication list, allergies, family history, social history, health maintenance with the patient/caregiver today.   ROS  Ten systems reviewed and is negative except as mentioned  in HPI    Objective  Vitals:   12/25/22 1045  BP: 110/72  Pulse: 98  Resp: 12  Temp: 97.9 F (36.6 C)  TempSrc: Oral  SpO2: 100%  Weight: 164 lb 11.2 oz (74.7 kg)  Height: 5\' 2"  (1.575 m)    Body mass index is 30.12 kg/m.  Physical Exam  Constitutional: Patient appears well-developed and well-nourished. Obese No distress.  HEENT: head atraumatic, normocephalic, pupils equal and reactive to light, neck supple Cardiovascular: Normal rate, regular rhythm and normal heart sounds.  No murmur heard. No BLE edema. Pulmonary/Chest: Effort normal and breath sounds  normal. No respiratory distress. Abdominal: Soft.  There is epigastric tenderness  Psychiatric: Patient has a normal mood and affect. behavior is normal. Judgment and thought content normal.   Recent Results (from the past 2160 hour(s))  POCT Rapid Strep A-Office (CPT [phone])     Status: Normal   Collection Time: 11/27/22  9:34 AM  Result Value Ref Range   Rapid Strep A Screen Negative Negative    PHQ2/9:    12/25/2022   10:56 AM 10/31/2022    3:33 PM 08/01/2022    1:30 PM 03/25/2022    8:57 AM 03/21/2022    8:44 AM  Depression screen PHQ 2/9  Decreased Interest 0 0 1 3 0  Down, Depressed, Hopeless 0 0 1 3 0  PHQ - 2 Score 0 0 2 6 0  Altered sleeping 0 0 0 3 0  Tired, decreased energy 0 3 1 3  0  Change in appetite 0 0 0 3 0  Feeling bad or failure about yourself  0 0 0 0 0  Trouble concentrating 0 0 0 1 0  Moving slowly or fidgety/restless 0 0 0 0 0  Suicidal thoughts 0 0 0 0 0  PHQ-9 Score 0 3 3 16  0  Difficult doing work/chores   Not difficult at all      phq 9 is negative   Fall Risk:    12/25/2022   10:56 AM 10/31/2022    3:33 PM 08/01/2022    1:30 PM 03/25/2022    8:57 AM 03/21/2022    8:44 AM  Fall Risk   Falls in the past year? 0 0 0 0 0  Number falls in past yr:  0  0   Injury with Fall?  0  0   Risk for fall due to : No Fall Risks No Fall Risks No Fall Risks No Fall Risks No Fall Risks  Follow up Falls prevention discussed Falls prevention discussed Falls prevention discussed Falls prevention discussed Falls prevention discussed    Assessment & Plan  1. Dyspepsia  - H. pylori breath test - CBC with Differential/Platelet - POCT urine pregnancy - omeprazole (PRILOSEC) 40 MG capsule; Take 1 capsule (40 mg total) by mouth daily.  Dispense: 30 capsule; Refill: 0  2. Diarrhea, unspecified type  - CBC with Differential/Platelet - COMPLETE METABOLIC PANEL WITH GFR - GI pathogen panel by PCR, stool  3. Abdominal cramping  - GI pathogen panel by PCR,  stool - hyoscyamine (LEVSIN/SL) 0.125 MG SL tablet; Place 1 tablet (0.125 mg total) under the tongue every 4 (four) hours as needed.  Dispense: 30 tablet; Refill: 0

## 2022-12-25 ENCOUNTER — Ambulatory Visit: Payer: BC Managed Care – PPO | Admitting: Family Medicine

## 2022-12-25 ENCOUNTER — Other Ambulatory Visit (HOSPITAL_COMMUNITY)
Admission: RE | Admit: 2022-12-25 | Discharge: 2022-12-25 | Disposition: A | Discharge status: Home / Self Care | Payer: BC Managed Care – PPO | Source: Ambulatory Visit | Attending: Family Medicine | Admitting: Family Medicine

## 2022-12-25 ENCOUNTER — Encounter: Payer: Self-pay | Admitting: Family Medicine

## 2022-12-25 ENCOUNTER — Ambulatory Visit: Payer: BC Managed Care – PPO

## 2022-12-25 VITALS — BP 110/72 | HR 98 | Temp 97.9°F | Resp 12 | Ht 62.0 in | Wt 164.7 lb

## 2022-12-25 DIAGNOSIS — R197 Diarrhea, unspecified: Secondary | ICD-10-CM | POA: Diagnosis not present

## 2022-12-25 DIAGNOSIS — R1013 Epigastric pain: Secondary | ICD-10-CM

## 2022-12-25 DIAGNOSIS — R109 Unspecified abdominal pain: Secondary | ICD-10-CM | POA: Diagnosis not present

## 2022-12-25 LAB — POCT URINE PREGNANCY: Preg Test, Ur: NEGATIVE

## 2022-12-25 MED ORDER — OMEPRAZOLE 40 MG PO CPDR: 40.0000 mg | DELAYED_RELEASE_CAPSULE | Freq: Every day | ORAL | 0 refills | Status: DC

## 2022-12-25 MED ORDER — HYOSCYAMINE SULFATE 0.125 MG SL SUBL: 0.1250 mg | SUBLINGUAL_TABLET | SUBLINGUAL | 0 refills | Status: DC | PRN

## 2022-12-26 ENCOUNTER — Ambulatory Visit (INDEPENDENT_AMBULATORY_CARE_PROVIDER_SITE_OTHER): Payer: Self-pay

## 2022-12-26 DIAGNOSIS — R109 Unspecified abdominal pain: Secondary | ICD-10-CM | POA: Diagnosis not present

## 2022-12-26 DIAGNOSIS — J309 Allergic rhinitis, unspecified: Secondary | ICD-10-CM | POA: Diagnosis not present

## 2022-12-26 DIAGNOSIS — R1013 Epigastric pain: Secondary | ICD-10-CM | POA: Diagnosis not present

## 2022-12-26 DIAGNOSIS — R197 Diarrhea, unspecified: Secondary | ICD-10-CM | POA: Diagnosis not present

## 2022-12-26 LAB — CERVICOVAGINAL ANCILLARY ONLY
Chlamydia: NEGATIVE
Comment: NEGATIVE
Comment: NORMAL
Neisseria Gonorrhea: NEGATIVE

## 2022-12-26 NOTE — Progress Notes (Signed)
Pt has epi pen on her person, consents and vial verified. Pt received allergy injection, no reactions noted.

## 2022-12-29 LAB — CBC WITH DIFFERENTIAL/PLATELET
Absolute Monocytes: 416 {cells}/uL (ref 200–950)
Basophils Absolute: 23 {cells}/uL (ref 0–200)
Basophils Relative: 0.3 %
Eosinophils Absolute: 31 {cells}/uL (ref 15–500)
Eosinophils Relative: 0.4 %
HCT: 36.2 % (ref 35.0–45.0)
Hemoglobin: 12.1 g/dL (ref 11.7–15.5)
Lymphs Abs: 2726 {cells}/uL (ref 850–3900)
MCH: 31.1 pg (ref 27.0–33.0)
MCHC: 33.4 g/dL (ref 32.0–36.0)
MCV: 93.1 fL (ref 80.0–100.0)
MPV: 9.1 fL (ref 7.5–12.5)
Monocytes Relative: 5.4 %
Neutro Abs: 4505 {cells}/uL (ref 1500–7800)
Neutrophils Relative %: 58.5 %
Platelets: 422 10*3/uL — ABNORMAL HIGH (ref 140–400)
RBC: 3.89 10*6/uL (ref 3.80–5.10)
RDW: 13.1 % (ref 11.0–15.0)
Total Lymphocyte: 35.4 %
WBC: 7.7 10*3/uL (ref 3.8–10.8)

## 2022-12-29 LAB — COMPLETE METABOLIC PANEL WITH GFR
AG Ratio: 1.4 (calc) (ref 1.0–2.5)
ALT: 10 U/L (ref 6–29)
AST: 11 U/L (ref 10–30)
Albumin: 4 g/dL (ref 3.6–5.1)
Alkaline phosphatase (APISO): 62 U/L (ref 31–125)
BUN: 11 mg/dL (ref 7–25)
CO2: 25 mmol/L (ref 20–32)
Calcium: 9.2 mg/dL (ref 8.6–10.2)
Chloride: 105 mmol/L (ref 98–110)
Creat: 0.79 mg/dL (ref 0.50–0.97)
Globulin: 2.9 g/dL (ref 1.9–3.7)
Glucose, Bld: 85 mg/dL (ref 65–99)
Potassium: 3.9 mmol/L (ref 3.5–5.3)
Sodium: 137 mmol/L (ref 135–146)
Total Bilirubin: 0.2 mg/dL (ref 0.2–1.2)
Total Protein: 6.9 g/dL (ref 6.1–8.1)
eGFR: 101 mL/min/{1.73_m2} (ref >=60)

## 2022-12-29 LAB — H. PYLORI BREATH TEST: H. pylori Breath Test: NOT DETECTED

## 2022-12-30 ENCOUNTER — Encounter: Payer: Self-pay | Admitting: Family Medicine

## 2022-12-30 LAB — GASTROINTESTINAL PATHOGEN PNL
CampyloBacter Group: NOT DETECTED
Norovirus GI/GII: NOT DETECTED
Rotavirus A: NOT DETECTED
Salmonella species: NOT DETECTED
Shiga Toxin 1: NOT DETECTED
Shiga Toxin 2: NOT DETECTED
Shigella Species: NOT DETECTED
Vibrio Group: NOT DETECTED
Yersinia enterocolitica: NOT DETECTED

## 2023-01-01 ENCOUNTER — Ambulatory Visit: Payer: BC Managed Care – PPO

## 2023-01-01 DIAGNOSIS — J309 Allergic rhinitis, unspecified: Secondary | ICD-10-CM

## 2023-01-01 NOTE — Progress Notes (Unsigned)
Return pt here for allergy injections. Pt has epi pen for appointment. No notable reaction after 30 min wait period.

## 2023-01-06 ENCOUNTER — Other Ambulatory Visit: Payer: Self-pay

## 2023-01-06 ENCOUNTER — Encounter: Payer: Self-pay | Admitting: Physician Assistant

## 2023-01-06 ENCOUNTER — Ambulatory Visit (INDEPENDENT_AMBULATORY_CARE_PROVIDER_SITE_OTHER): Payer: Self-pay | Admitting: Physician Assistant

## 2023-01-06 VITALS — BP 108/84 | HR 107 | Temp 96.2°F | Ht 62.0 in | Wt 164.0 lb

## 2023-01-06 DIAGNOSIS — J029 Acute pharyngitis, unspecified: Secondary | ICD-10-CM

## 2023-01-06 DIAGNOSIS — R52 Pain, unspecified: Secondary | ICD-10-CM

## 2023-01-06 DIAGNOSIS — B349 Viral infection, unspecified: Secondary | ICD-10-CM

## 2023-01-06 LAB — POCT RAPID STREP A (OFFICE): Rapid Strep A Screen: NEGATIVE

## 2023-01-06 LAB — POC COVID19 BINAXNOW: SARS Coronavirus 2 Ag: NEGATIVE

## 2023-01-06 NOTE — Progress Notes (Signed)
[facility] [address] [place], [place] [address]   Office Visit Note  Patient Name: Patricia Horn Date of Birth [birth year]  Medical Record number [id number]  Date of Service: 01/06/2023  Chief Complaint  Patient presents with   Acute Visit    Patient c/o fatigue and sore throat x 3 days. She states her throat feels slightly better today but she has been feeling some postnasal drainage as well. She has taken Benadryl but has not seen much improvement in symptoms.      32 y/o F presents to the clinic for c/o sore throat, body aches, fatigue, and slight post nasal drainage. NO known exposure to covid. No recent travel. NO known exposure to strep. Denies fever, chills, CP, SOB or wheezing.       Current Medication:  Outpatient Encounter Medications as of 01/06/2023  Medication Sig   DULoxetine (CYMBALTA) 30 MG capsule Take 3 capsules (90 mg total) by mouth daily.   EPINEPHrine 0.3 mg/0.3 mL IJ SOAJ injection Inject 0.3 mg into the muscle as needed for anaphylaxis.   hydrOXYzine (ATARAX) 10 MG tablet Take 1-2 tablets (10-20 mg total) by mouth every 4 (four) hours as needed.   hyoscyamine (LEVSIN/SL) 0.125 MG SL tablet Place 1 tablet (0.125 mg total) under the tongue every 4 (four) hours as needed.   levocetirizine (XYZAL) 5 MG tablet Take 1 tablet (5 mg total) by mouth every evening.   norethindrone-ethinyl estradiol-FE (LARIN FE 1/20) 1-20 MG-MCG tablet Take 1 tablet by mouth daily.   Olopatadine-Mometasone (RYALTRIS) X543819 MCG/ACT SUSP Place 2 sprays into the nose 2 (two) times daily. (Patient taking differently: Place 2 sprays into the nose 2 (two) times daily as needed.)   omeprazole (PRILOSEC) 40 MG capsule Take 1 capsule (40 mg total) by mouth daily.   WEGOVY 2.4 MG/0.75ML SOAJ INJECT 2.4 MG SUBCUTANEOUSLY ONCE A WEEK   [DISCONTINUED] montelukast (SINGULAIR) 10 MG tablet Take 1 tablet (10 mg total) by mouth at bedtime. (Patient not taking: Reported on 11/27/2022)   No  facility-administered encounter medications on file as of 01/06/2023.      Medical History: Past Medical History:  Diagnosis Date   Anxiety    Depression    Environmental allergies    Fibroid uterus    Tendinitis    Umbilical hernia    Uterine leiomyoma 03/02/2019     Vital Signs: BP 108/84   Pulse (!) 107   Temp (!) 96.2 F (35.7 C)   Ht 5\' 2"  (1.575 m)   Wt 164 lb (74.4 kg)   SpO2 97%   BMI 30.00 kg/m    Review of Systems  Constitutional:  Positive for fatigue. Negative for chills and fever.  HENT:  Positive for postnasal drip and sore throat. Negative for sinus pressure, sinus pain and trouble swallowing.   Respiratory: Negative.    Cardiovascular: Negative.   Musculoskeletal:  Positive for myalgias.  Neurological: Negative.     Physical Exam Constitutional:      Appearance: Normal appearance.  HENT:     Head: Atraumatic.     Right Ear: Tympanic membrane, ear canal and external ear normal.     Left Ear: Tympanic membrane, ear canal and external ear normal.     Nose: Nose normal.     Left Turbinates: Enlarged.     Mouth/Throat:     Mouth: Mucous membranes are moist.     Pharynx: Oropharynx is clear.  Eyes:     Extraocular Movements: Extraocular  movements intact.  Cardiovascular:     Rate and Rhythm: Normal rate and regular rhythm.  Pulmonary:     Effort: Pulmonary effort is normal.     Breath sounds: Normal breath sounds.  Musculoskeletal:     Cervical back: Neck supple.  Skin:    General: Skin is warm.  Neurological:     Mental Status: She is alert.  Psychiatric:        Mood and Affect: Mood normal.        Behavior: Behavior normal.        Thought Content: Thought content normal.        Judgment: Judgment normal.       Assessment/Plan:  1. Viral illness  2. Sore throat - POCT rapid strep A  3. Body aches - POC COVID-19 BinaxNow  Reviewed negative strep and covid test results with patient.  Stay well hydrated. May take Ibuprofen or  Tylenol prn for pain and sore throat as directed on the box. Drink warm liquids or cool soft ice cream to help soothe the throat.  Pt verbalized understanding and in agreement.  PT will contact us if symptoms don't improve or worsen.   General Counseling: Ridley verbalizes understanding of the findings of todays visit and agrees with plan of treatment. I have discussed any further diagnostic evaluation that may be needed or ordered today. We also reviewed her medications today. she has been encouraged to call the office with any questions or concerns that should arise related to todays visit.    Time spent:30 Minutes    Gilberto Better, [place] Physician Assistant

## 2023-01-07 ENCOUNTER — Ambulatory Visit: Payer: BC Managed Care – PPO | Admitting: Adult Health

## 2023-01-08 ENCOUNTER — Ambulatory Visit (INDEPENDENT_AMBULATORY_CARE_PROVIDER_SITE_OTHER): Payer: BC Managed Care – PPO

## 2023-01-08 DIAGNOSIS — Z2989 Encounter for other specified prophylactic measures: Secondary | ICD-10-CM

## 2023-01-08 DIAGNOSIS — J309 Allergic rhinitis, unspecified: Secondary | ICD-10-CM

## 2023-01-08 NOTE — Progress Notes (Signed)
Return pt here for allergy injections. Pt has epi pen for the appointment. No previous reactions to the injection were reported. Pt is well, no complaints. No reaction noted after the 30 min wait period.

## 2023-01-15 ENCOUNTER — Ambulatory Visit (INDEPENDENT_AMBULATORY_CARE_PROVIDER_SITE_OTHER): Payer: BC Managed Care – PPO

## 2023-01-15 DIAGNOSIS — F331 Major depressive disorder, recurrent, moderate: Secondary | ICD-10-CM | POA: Diagnosis not present

## 2023-01-15 DIAGNOSIS — Z2989 Encounter for other specified prophylactic measures: Secondary | ICD-10-CM | POA: Diagnosis not present

## 2023-01-15 DIAGNOSIS — J309 Allergic rhinitis, unspecified: Secondary | ICD-10-CM | POA: Diagnosis not present

## 2023-01-15 NOTE — Progress Notes (Signed)
Return pt here for allergy injections. Pt has epi pen for the appointment. No previous reactions to the injection were reported. Pt is well, no complaints. 2mm wheal noted after the 30 min wait period.

## 2023-01-16 IMAGING — US US ABDOMEN LIMITED RUQ/ASCITES
1 series · 10 of 10 positions shown · non-contrast
Comparison: None.

CLINICAL DATA: Periumbilical abdominal pain for 1.5 months

EXAM:
ULTRASOUND ABDOMEN LIMITED

[Series 1: us abdomen limited · 10 acquisitions, 10 frames shown]
[im 1/10]
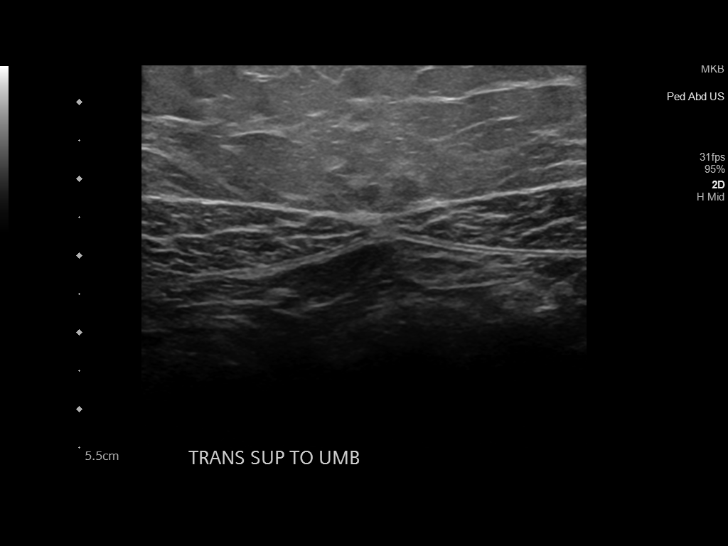
[im 2/10]
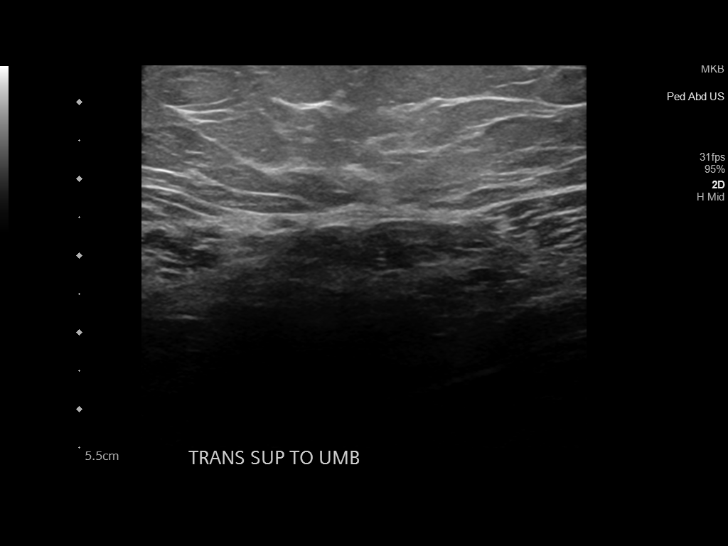
[im 3/10]
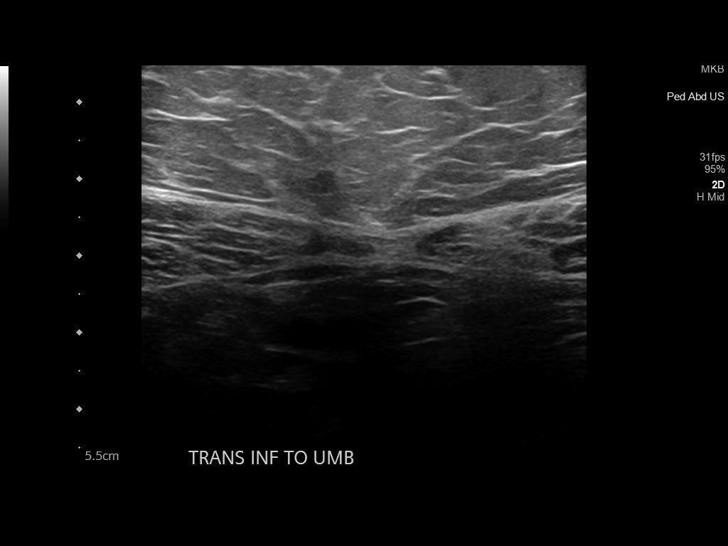
[im 4/10]
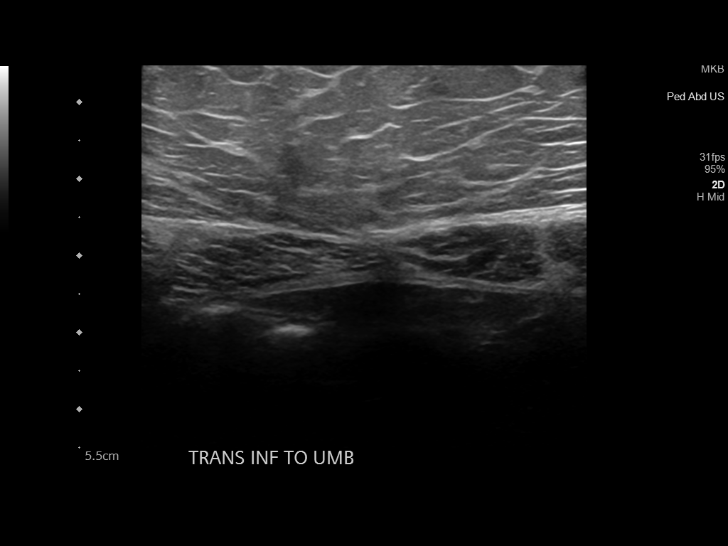
[im 5/10]
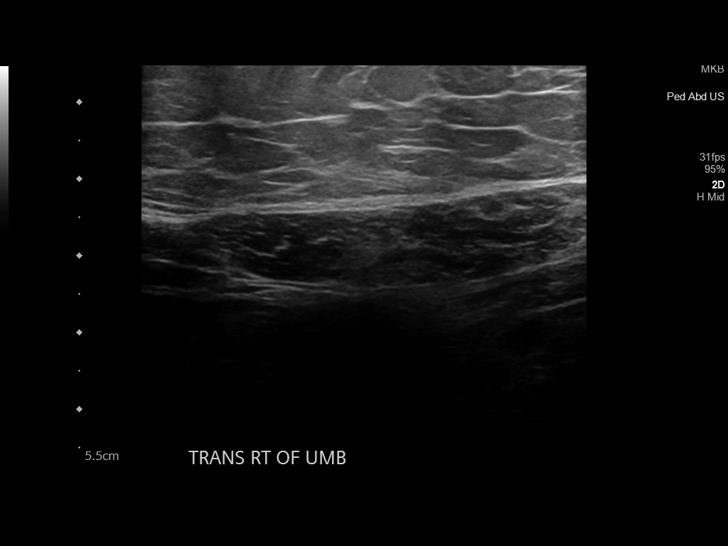
[im 6/10]
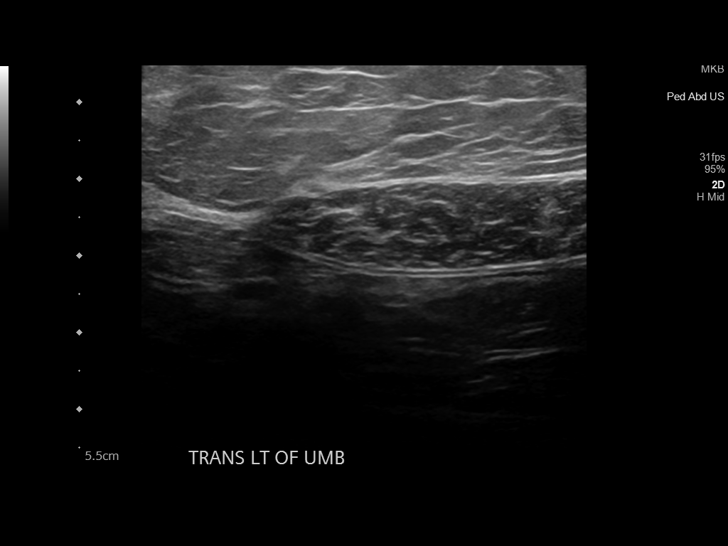
[im 7/10]
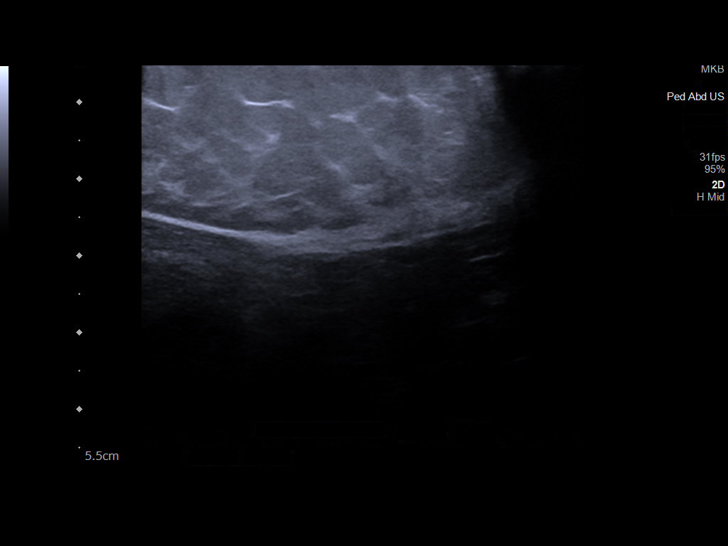
[im 8/10]
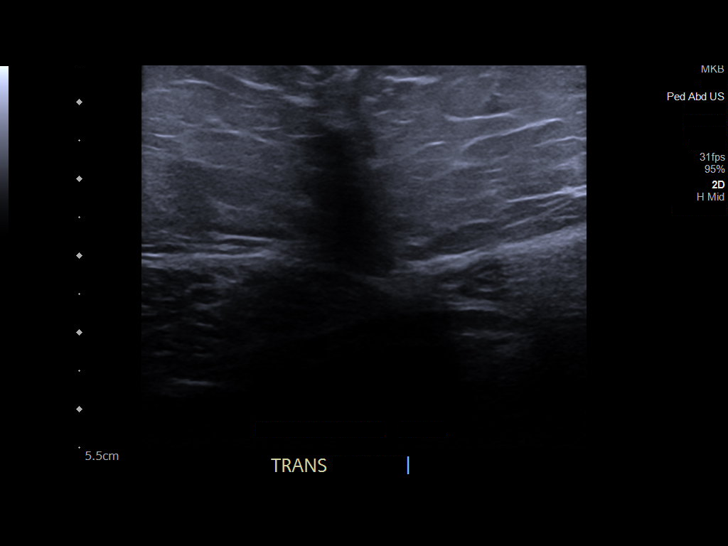
[im 9/10]
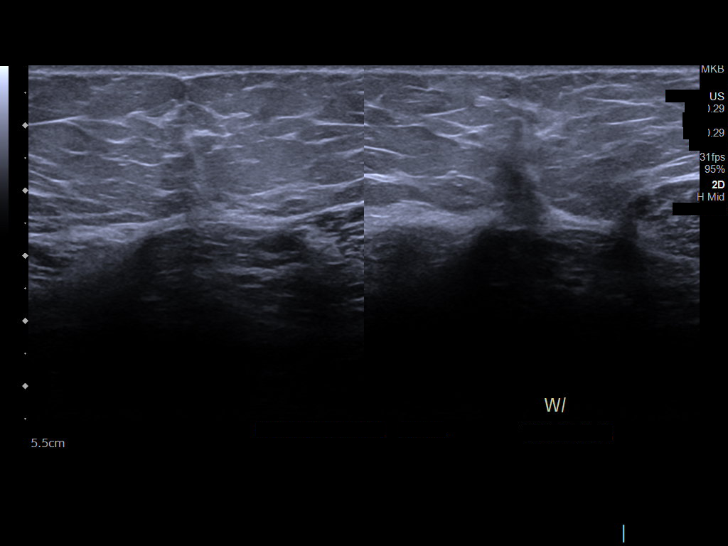
[im 10/10]
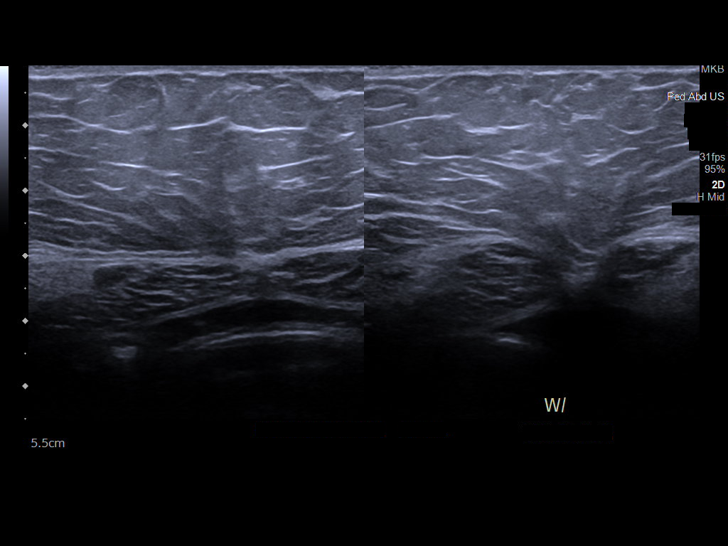

[10 of 10 positions shown; findings below may reference images not displayed]

FINDINGS: No discrete sonographic abnormality identified on targeted
evaluation of the area of concern in the periumbilical region.
IMPRESSION: No hernia identified in the periumbilical region.

## 2023-01-20 ENCOUNTER — Ambulatory Visit (INDEPENDENT_AMBULATORY_CARE_PROVIDER_SITE_OTHER): Payer: BC Managed Care – PPO

## 2023-01-20 DIAGNOSIS — Z2989 Encounter for other specified prophylactic measures: Secondary | ICD-10-CM | POA: Diagnosis not present

## 2023-01-20 DIAGNOSIS — J309 Allergic rhinitis, unspecified: Secondary | ICD-10-CM

## 2023-01-20 NOTE — Progress Notes (Signed)
Return pt here for allergy injections. Pt has epi pen for the appointment. No previous reactions to the injection were reported. Pt is well, no complaints. 5mm wheal noted after the 30 min wait period.

## 2023-01-23 ENCOUNTER — Ambulatory Visit: Payer: BC Managed Care – PPO

## 2023-01-28 ENCOUNTER — Other Ambulatory Visit: Payer: Self-pay

## 2023-01-28 DIAGNOSIS — F411 Generalized anxiety disorder: Secondary | ICD-10-CM

## 2023-01-28 DIAGNOSIS — F331 Major depressive disorder, recurrent, moderate: Secondary | ICD-10-CM

## 2023-01-28 MED ORDER — DULOXETINE HCL 30 MG PO CPEP
90.0000 mg | ORAL_CAPSULE | Freq: Every day | ORAL | 0 refills | Status: DC
Start: 2023-01-28 — End: 2023-03-26

## 2023-01-29 ENCOUNTER — Ambulatory Visit: Payer: BC Managed Care – PPO

## 2023-01-29 DIAGNOSIS — Z2989 Encounter for other specified prophylactic measures: Secondary | ICD-10-CM | POA: Diagnosis not present

## 2023-01-29 DIAGNOSIS — J309 Allergic rhinitis, unspecified: Secondary | ICD-10-CM | POA: Diagnosis not present

## 2023-01-29 NOTE — Progress Notes (Signed)
Return pt here for allergy injections. Pt has epi pen for the appointment. No previous reactions to the injection were reported. Pt is well, no complaints. 2mm wheal noted after the 30 min wait period.

## 2023-02-04 ENCOUNTER — Encounter: Payer: Self-pay | Admitting: Family Medicine

## 2023-02-05 ENCOUNTER — Telehealth: Payer: Self-pay

## 2023-02-05 ENCOUNTER — Other Ambulatory Visit: Payer: Self-pay | Admitting: Internal Medicine

## 2023-02-05 ENCOUNTER — Ambulatory Visit: Payer: BC Managed Care – PPO

## 2023-02-06 ENCOUNTER — Ambulatory Visit (INDEPENDENT_AMBULATORY_CARE_PROVIDER_SITE_OTHER): Payer: BC Managed Care – PPO

## 2023-02-06 ENCOUNTER — Other Ambulatory Visit: Payer: Self-pay

## 2023-02-06 ENCOUNTER — Other Ambulatory Visit: Payer: Self-pay | Admitting: Internal Medicine

## 2023-02-06 DIAGNOSIS — J309 Allergic rhinitis, unspecified: Secondary | ICD-10-CM

## 2023-02-06 DIAGNOSIS — E66811 Obesity, class 1: Secondary | ICD-10-CM

## 2023-02-06 DIAGNOSIS — Z3041 Encounter for surveillance of contraceptive pills: Secondary | ICD-10-CM

## 2023-02-06 DIAGNOSIS — Z2989 Encounter for other specified prophylactic measures: Secondary | ICD-10-CM | POA: Diagnosis not present

## 2023-02-06 MED ORDER — NORETHIN ACE-ETH ESTRAD-FE 1-20 MG-MCG PO TABS
1.0000 | ORAL_TABLET | Freq: Every day | ORAL | 1 refills | Status: DC
Start: 2023-02-06 — End: 2023-02-17

## 2023-02-06 MED ORDER — WEGOVY 2.4 MG/0.75ML ~~LOC~~ SOAJ
2.4000 mg | SUBCUTANEOUS | 0 refills | Status: DC
Start: 2023-02-06 — End: 2023-03-18

## 2023-02-06 NOTE — Telephone Encounter (Signed)
Patricia Horn was sent to [facility] via e-rx.

## 2023-02-06 NOTE — Progress Notes (Signed)
Return pt here for allergy injections. Pt has epi pen for the appointment. No previous reactions to the injection were reported. Pt is well, no complaints. 3mm wheal noted after the 30 min wait period.

## 2023-02-12 ENCOUNTER — Ambulatory Visit (INDEPENDENT_AMBULATORY_CARE_PROVIDER_SITE_OTHER): Payer: BC Managed Care – PPO

## 2023-02-12 ENCOUNTER — Ambulatory Visit: Payer: BC Managed Care – PPO

## 2023-02-12 DIAGNOSIS — J309 Allergic rhinitis, unspecified: Secondary | ICD-10-CM

## 2023-02-12 DIAGNOSIS — Z2989 Encounter for other specified prophylactic measures: Secondary | ICD-10-CM | POA: Diagnosis not present

## 2023-02-12 NOTE — Progress Notes (Signed)
Return pt here for allergy injections. Pt has epi pen for the appointment. No previous reactions to the injection were reported. Pt is well, no complaints. No reaction noted after the 30 min wait period.

## 2023-02-16 ENCOUNTER — Encounter: Payer: Self-pay | Admitting: Obstetrics and Gynecology

## 2023-02-16 DIAGNOSIS — Z3041 Encounter for surveillance of contraceptive pills: Secondary | ICD-10-CM

## 2023-02-17 DIAGNOSIS — F331 Major depressive disorder, recurrent, moderate: Secondary | ICD-10-CM | POA: Diagnosis not present

## 2023-02-17 MED ORDER — NORETHIN ACE-ETH ESTRAD-FE 1-20 MG-MCG PO TABS
1.0000 | ORAL_TABLET | Freq: Every day | ORAL | 3 refills | Status: AC
Start: 2023-02-17 — End: ?

## 2023-02-17 NOTE — Addendum Note (Signed)
Addended by: Fabian November on: 02/17/2023 11:10 AM   Modules accepted: Orders

## 2023-02-19 ENCOUNTER — Ambulatory Visit: Payer: BC Managed Care – PPO

## 2023-02-19 DIAGNOSIS — J309 Allergic rhinitis, unspecified: Secondary | ICD-10-CM | POA: Diagnosis not present

## 2023-02-19 DIAGNOSIS — Z2989 Encounter for other specified prophylactic measures: Secondary | ICD-10-CM | POA: Diagnosis not present

## 2023-02-19 NOTE — Progress Notes (Signed)
Return pt here for allergy injections. Pt has epi pen for the appointment. No previous reactions to the injection were reported. Pt is well, no complaints. 3mm wheal noted after the 30 min wait period.

## 2023-02-26 ENCOUNTER — Ambulatory Visit: Payer: BC Managed Care – PPO

## 2023-02-26 DIAGNOSIS — J309 Allergic rhinitis, unspecified: Secondary | ICD-10-CM

## 2023-02-26 DIAGNOSIS — Z2989 Encounter for other specified prophylactic measures: Secondary | ICD-10-CM

## 2023-02-26 NOTE — Progress Notes (Signed)
Return pt here for allergy injections. Pt has epi pen for the appointment. No previous reactions to the injection were reported. Pt is well, no complaints. No reaction noted after the 30 min wait period.

## 2023-03-05 ENCOUNTER — Ambulatory Visit: Payer: BC Managed Care – PPO

## 2023-03-05 DIAGNOSIS — J309 Allergic rhinitis, unspecified: Secondary | ICD-10-CM

## 2023-03-05 DIAGNOSIS — Z2989 Encounter for other specified prophylactic measures: Secondary | ICD-10-CM

## 2023-03-05 NOTE — Progress Notes (Unsigned)
Return pt here for allergy injections. Pt has epi pen for the appointment. No previous reactions to the injection were reported. Pt is well, no complaints. No reaction noted after the 30 min wait period.

## 2023-03-12 ENCOUNTER — Ambulatory Visit (INDEPENDENT_AMBULATORY_CARE_PROVIDER_SITE_OTHER): Payer: Self-pay

## 2023-03-12 DIAGNOSIS — J309 Allergic rhinitis, unspecified: Secondary | ICD-10-CM | POA: Diagnosis not present

## 2023-03-12 DIAGNOSIS — Z2989 Encounter for other specified prophylactic measures: Secondary | ICD-10-CM | POA: Diagnosis not present

## 2023-03-12 NOTE — Progress Notes (Signed)
Return pt here for allergy injections. Pt has epi pen for the appointment. No previous reactions to the injection were reported. Pt is well, no complaints. No reaction noted after the 30 min wait period.

## 2023-03-16 ENCOUNTER — Ambulatory Visit: Payer: BC Managed Care – PPO

## 2023-03-16 DIAGNOSIS — Z2989 Encounter for other specified prophylactic measures: Secondary | ICD-10-CM

## 2023-03-16 NOTE — Progress Notes (Signed)
Return pt here for allergy injections. Pt has epi pen for the appointment. No previous reactions to the injection were reported. Pt is well, no complaints. No reaction noted after the 30 min wait period.

## 2023-03-18 ENCOUNTER — Other Ambulatory Visit: Payer: Self-pay | Admitting: Internal Medicine

## 2023-03-18 DIAGNOSIS — E66811 Obesity, class 1: Secondary | ICD-10-CM

## 2023-03-18 DIAGNOSIS — J301 Allergic rhinitis due to pollen: Secondary | ICD-10-CM | POA: Diagnosis not present

## 2023-03-19 ENCOUNTER — Ambulatory Visit: Payer: BC Managed Care – PPO

## 2023-03-19 MED ORDER — WEGOVY 2.4 MG/0.75ML ~~LOC~~ SOAJ
2.4000 mg | SUBCUTANEOUS | 0 refills | Status: DC
Start: 2023-03-19 — End: 2023-03-26

## 2023-03-19 NOTE — Telephone Encounter (Signed)
I have scheduled her for 03/26/2023

## 2023-03-25 DIAGNOSIS — J301 Allergic rhinitis due to pollen: Secondary | ICD-10-CM | POA: Diagnosis not present

## 2023-03-25 NOTE — Progress Notes (Unsigned)
Name: Patricia Horn   MRN: [id number]    DOB: 1990-05-14   Date:03/26/2023       Progress Note  Subjective  Chief Complaint  Medication Refill  HPI  Obesity: her weight in high school was around 130-135 lbs, that was her ideal body weight - BMI below 25. Around senior in high school through freshman year in college her weight went up to 200 lbs. After that her weight has been up and down Her weight was between 170-174 lbs from 2022 and May 2023. Up to 187 lbs in August and it spiked to 194 lbs in Nov, that was  at 24 lbs weight gain in one year . BMI is above 35. She resumed her personal training sessions Nov 2023 and was going to weight loss clinic and getting Ozempic shots, but since Feb she is getting Reginal Lutes ( now covered by insurance ) weight was 194 lbs and is down to 157 lbs  . She ran out of medication about 2 weeks ago and has noticed increase in fast food and salt cravings. Explained Wegovy 2.4 mg dose was sent to [facility] on 11/14 , I will send a 90 day supply today   Depression/ Major-recurrent currently mild,  GAD:  she also has seasonal affective disorder. 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 She also worries about her future. She has social anxiety and has difficulty going on dates but would like to find a partner, get married and have children. She is seeing a therapist Harrold Donath) we discussed CBT. She is compliant with Duloxetine 90 mg daily but is feeling more down, discussed adding Vraylar again but she is not sure .   Insomnia: she has noticed increase in vivid dreams, taking melatonin and Unyson otc, discussed hydroxyzine instead    Allergies: she was seeing Dr. Wardell Heath and having allergy shots but with job change she has not been able to take time off. Currently on Xyzal and singulair, asked if singulair maker her feel moody but she denied. Stable  Patient Active Problem List   Diagnosis Date Noted   MDD (major  depressive disorder), recurrent episode, moderate (HCC) 10/22/2020   Environmental allergies 04/19/2020   Allergy to cats 04/19/2020   Uterine leiomyoma 03/02/2019   Bilateral hand pain 12/28/2017   Right carpal tunnel syndrome 11/10/2016   Urticaria 11/09/2015   Obesity (BMI 35.0-39.9 without comorbidity) 01/05/2015   Umbilical cyst 11/30/2014   Perennial allergic rhinitis 11/16/2014   GAD (generalized anxiety disorder) 10/06/2014   ANA positive 10/06/2014    Past Surgical History:  Procedure Laterality Date   HERNIA REPAIR  1995   umbilical    MYOMECTOMY  2015    Family History  Problem Relation Age of Onset   Hypertension Mother    Scleroderma Mother    Pulmonary fibrosis Mother    Asthma Mother    Cancer Neg Hx     Social History   Tobacco Use   Smoking status: Never   Smokeless tobacco: Never  Substance Use Topics   Alcohol use: Yes    Alcohol/week: 1.0 standard drink of alcohol    Types: 1 Standard drinks or equivalent per week    Comment: ocassional     Current Outpatient Medications:    DULoxetine (CYMBALTA) 30 MG capsule, Take 3 capsules (90 mg total) by mouth daily., Disp: 270 capsule, Rfl: 0   EPINEPHrine 0.3 mg/0.3 mL IJ SOAJ  injection, Inject 0.3 mg into the muscle as needed for anaphylaxis., Disp: 1 each, Rfl: 5   hydrOXYzine (ATARAX) 10 MG tablet, Take 1-2 tablets (10-20 mg total) by mouth every 4 (four) hours as needed., Disp: 120 tablet, Rfl: 2   hyoscyamine (LEVSIN/SL) 0.125 MG SL tablet, Place 1 tablet (0.125 mg total) under the tongue every 4 (four) hours as needed., Disp: 30 tablet, Rfl: 0   levocetirizine (XYZAL) 5 MG tablet, Take 1 tablet (5 mg total) by mouth every evening., Disp: 90 tablet, Rfl: 1   norethindrone-ethinyl estradiol-FE (LARIN FE 1/20) 1-20 MG-MCG tablet, Take 1 tablet by mouth daily. Skip placebo pills. Take continuously., Disp: 112 tablet, Rfl: 3   Olopatadine-Mometasone (RYALTRIS) 665-25 MCG/ACT SUSP, Place 2 sprays into the  nose 2 (two) times daily. (Patient taking differently: Place 2 sprays into the nose 2 (two) times daily as needed.), Disp: 29 g, Rfl: 1   omeprazole (PRILOSEC) 40 MG capsule, Take 1 capsule (40 mg total) by mouth daily., Disp: 30 capsule, Rfl: 0   Semaglutide-Weight Management (WEGOVY) 2.4 MG/0.75ML SOAJ, Inject 2.4 mg into the skin once a week., Disp: 3 mL, Rfl: 0  Allergies  Allergen Reactions   Peanut Oil Swelling    Eyelids swell.   Peanut-Containing Drug Products     I personally reviewed active problem list, medication list, allergies, family history, social history, health maintenance with the patient/caregiver today.   ROS  Ten systems reviewed and is negative except as mentioned in HPI    Objective  Vitals:   03/26/23 1145  BP: 112/78  Pulse: 94  Resp: 12  Temp: 98 F (36.7 C)  TempSrc: Oral  SpO2: 98%  Weight: 157 lb 4.8 oz (71.4 kg)  Height: 5\' 2"  (1.575 m)    Body mass index is 28.77 kg/m.  Physical Exam  Constitutional: Patient appears well-developed and well-nourished. Obese  No distress.  HEENT: head atraumatic, normocephalic, pupils equal and reactive to light, neck supple, throat within normal limits Cardiovascular: Normal rate, regular rhythm and normal heart sounds.  No murmur heard. No BLE edema. Pulmonary/Chest: Effort normal and breath sounds normal. No respiratory distress. Abdominal: Soft.  There is no tenderness. Psychiatric: Patient has a normal mood and affect. behavior is normal. Judgment and thought content normal.    PHQ2/9:    03/26/2023   11:47 AM 12/25/2022   10:56 AM 10/31/2022    3:33 PM 08/01/2022    1:30 PM 03/25/2022    8:57 AM  Depression screen PHQ 2/9  Decreased Interest 0 0 0 1 3  Down, Depressed, Hopeless 0 0 0 1 3  PHQ - 2 Score 0 0 0 2 6  Altered sleeping 3 0 0 0 3  Tired, decreased energy 3 0 3 1 3   Change in appetite 2 0 0 0 3  Feeling bad or failure about yourself  0 0 0 0 0  Trouble concentrating 0 0 0 0 1   Moving slowly or fidgety/restless 0 0 0 0 0  Suicidal thoughts 0 0 0 0 0  PHQ-9 Score 8 0 3 3 16   Difficult doing work/chores Somewhat difficult   Not difficult at all     phq 9 is positive   Fall Risk:    03/26/2023   11:47 AM 12/25/2022   10:56 AM 10/31/2022    3:33 PM 08/01/2022    1:30 PM 03/25/2022    8:57 AM  Fall Risk   Falls in the past year? 0 0 0  0 0  Number falls in past yr:   0  0  Injury with Fall?   0  0  Risk for fall due to : No Fall Risks No Fall Risks No Fall Risks No Fall Risks No Fall Risks  Follow up Falls prevention discussed Falls prevention discussed Falls prevention discussed Falls prevention discussed Falls prevention discussed      Functional Status Survey: Is the patient deaf or have difficulty hearing?: No Does the patient have difficulty seeing, even when wearing glasses/contacts?: No Does the patient have difficulty concentrating, remembering, or making decisions?: No Does the patient have difficulty walking or climbing stairs?: No Does the patient have difficulty dressing or bathing?: No Does the patient have difficulty doing errands alone such as visiting a doctor's office or shopping?: No    Assessment & Plan  1. MDD (major depressive disorder), recurrent episode, moderate (HCC)  - DULoxetine (CYMBALTA) 30 MG capsule; Take 3 capsules (90 mg total) by mouth daily.  Dispense: 270 capsule; Refill: 0  2. Seasonal affective disorder [facility])  She will think about Vraylar  3. History of obesity  - Semaglutide-Weight Management (WEGOVY) 2.4 MG/0.75ML SOAJ; Inject 2.4 mg into the skin once a week.  Dispense: 9 mL; Refill: 0  4. Psychophysiological insomnia  - hydrOXYzine (ATARAX) 10 MG tablet; Take 1-2 tablets (10-20 mg total) by mouth daily.  Dispense: 90 tablet; Refill: 0  5. Need for immunization against influenza  - Flu vaccine trivalent PF, 6mos and older(Flulaval,Afluria,Fluarix,Fluzone)  6. GAD (generalized anxiety disorder)  -  DULoxetine (CYMBALTA) 30 MG capsule; Take 3 capsules (90 mg total) by mouth daily.  Dispense: 270 capsule; Refill: 0 - hydrOXYzine (ATARAX) 10 MG tablet; Take 1-2 tablets (10-20 mg total) by mouth daily.  Dispense: 90 tablet; Refill: 0

## 2023-03-26 ENCOUNTER — Ambulatory Visit: Payer: BC Managed Care – PPO | Admitting: Family Medicine

## 2023-03-26 ENCOUNTER — Ambulatory Visit: Payer: BC Managed Care – PPO

## 2023-03-26 ENCOUNTER — Encounter: Payer: Self-pay | Admitting: Family Medicine

## 2023-03-26 VITALS — BP 112/78 | HR 94 | Temp 98.0°F | Resp 12 | Ht 62.0 in | Wt 157.3 lb

## 2023-03-26 DIAGNOSIS — F338 Other recurrent depressive disorders: Secondary | ICD-10-CM | POA: Diagnosis not present

## 2023-03-26 DIAGNOSIS — Z8639 Personal history of other endocrine, nutritional and metabolic disease: Secondary | ICD-10-CM

## 2023-03-26 DIAGNOSIS — Z23 Encounter for immunization: Secondary | ICD-10-CM | POA: Diagnosis not present

## 2023-03-26 DIAGNOSIS — F5104 Psychophysiologic insomnia: Secondary | ICD-10-CM

## 2023-03-26 DIAGNOSIS — F411 Generalized anxiety disorder: Secondary | ICD-10-CM

## 2023-03-26 DIAGNOSIS — F331 Major depressive disorder, recurrent, moderate: Secondary | ICD-10-CM | POA: Diagnosis not present

## 2023-03-26 MED ORDER — HYDROXYZINE HCL 10 MG PO TABS: 10.0000 mg | ORAL_TABLET | Freq: Every day | ORAL | 0 refills | Status: AC

## 2023-03-26 MED ORDER — DULOXETINE HCL 30 MG PO CPEP: 90.0000 mg | ORAL_CAPSULE | Freq: Every day | ORAL | 0 refills | Status: DC

## 2023-03-26 MED ORDER — WEGOVY 2.4 MG/0.75ML ~~LOC~~ SOAJ: 2.4000 mg | SUBCUTANEOUS | 0 refills | Status: DC

## 2023-04-01 DIAGNOSIS — J301 Allergic rhinitis due to pollen: Secondary | ICD-10-CM | POA: Diagnosis not present

## 2023-04-08 DIAGNOSIS — J301 Allergic rhinitis due to pollen: Secondary | ICD-10-CM | POA: Diagnosis not present

## 2023-04-08 DIAGNOSIS — F331 Major depressive disorder, recurrent, moderate: Secondary | ICD-10-CM | POA: Diagnosis not present

## 2023-04-15 DIAGNOSIS — J301 Allergic rhinitis due to pollen: Secondary | ICD-10-CM | POA: Diagnosis not present

## 2023-04-22 ENCOUNTER — Encounter: Payer: Self-pay | Admitting: Family Medicine

## 2023-04-22 DIAGNOSIS — J301 Allergic rhinitis due to pollen: Secondary | ICD-10-CM | POA: Diagnosis not present

## 2023-04-27 DIAGNOSIS — J301 Allergic rhinitis due to pollen: Secondary | ICD-10-CM | POA: Diagnosis not present

## 2023-04-27 DIAGNOSIS — K219 Gastro-esophageal reflux disease without esophagitis: Secondary | ICD-10-CM | POA: Diagnosis not present

## 2023-05-05 ENCOUNTER — Telehealth: Payer: BC Managed Care – PPO | Admitting: Physician Assistant

## 2023-05-05 ENCOUNTER — Ambulatory Visit: Payer: Self-pay

## 2023-05-05 DIAGNOSIS — J301 Allergic rhinitis due to pollen: Secondary | ICD-10-CM | POA: Diagnosis not present

## 2023-05-05 DIAGNOSIS — R112 Nausea with vomiting, unspecified: Secondary | ICD-10-CM | POA: Diagnosis not present

## 2023-05-05 MED ORDER — ONDANSETRON HCL 4 MG PO TABS: 4.0000 mg | ORAL_TABLET | Freq: Three times a day (TID) | ORAL | 0 refills | Status: DC | PRN

## 2023-05-05 NOTE — Progress Notes (Signed)
 Virtual Visit Consent   Patricia Horn, you are scheduled for a virtual visit with a [facility] provider today. Just as with appointments in the office, your consent must be obtained to participate. Your consent will be active for this visit and any virtual visit you may have with one of our providers in the next 365 days. If you have a MyChart account, a copy of this consent can be sent to you electronically.  As this is a virtual visit, video technology does not allow for your provider to perform a traditional examination. This may limit your provider's ability to fully assess your condition. If your provider identifies any concerns that need to be evaluated in person or the need to arrange testing (such as labs, EKG, etc.), we will make arrangements to do so. Although advances in technology are sophisticated, we cannot ensure that it will always work on either your end or our end. If the connection with a video visit is poor, the visit may have to be switched to a telephone visit. With either a video or telephone visit, we are not always able to ensure that we have a secure connection.  By engaging in this virtual visit, you consent to the provision of healthcare and authorize for your insurance to be billed (if applicable) for the services provided during this visit. Depending on your insurance coverage, you may receive a charge related to this service.  I need to obtain your verbal consent now. Are you willing to proceed with your visit today? Savina TERRINA DOCTER has provided verbal consent on 05/05/2023 for a virtual visit (video or telephone). Harlene PEDLAR Ward, PA-C  Date: 05/05/2023 5:22 PM  Virtual Visit via Video Note   I, Harlene PEDLAR Ward, connected with  Patricia Horn  ([id number], Mar 11, 1991) on 05/05/23 at  5:15 PM EST by a video-enabled telemedicine application and verified that I am speaking with the correct person using two identifiers.  Location: Patient: Virtual Visit Location Patient:  Home Provider: Virtual Visit Location Provider: Home Office   I discussed the limitations of evaluation and management by telemedicine and the availability of in person appointments. The patient expressed understanding and agreed to proceed.    History of Present Illness: Patricia Horn is a 32 y.o. who identifies as a female who was assigned female at birth, and is being seen today for nausea and vomiting that started earlier. She also complains of diarrhea.  She reports restarting her Wegovy  today after not taking it for about one month due to insurance. She reports taking her normal dose.  Denies fever, chills. Tolerating fluids.   HPI: HPI  Problems:  Patient Active Problem List   Diagnosis Date Noted   MDD (major depressive disorder), recurrent episode, moderate (HCC) 10/22/2020   Environmental allergies 04/19/2020   Allergy to cats 04/19/2020   Uterine leiomyoma 03/02/2019   Bilateral hand pain 12/28/2017   Right carpal tunnel syndrome 11/10/2016   Urticaria 11/09/2015   Obesity (BMI 35.0-39.9 without comorbidity) 01/05/2015   Umbilical cyst 11/30/2014   Perennial allergic rhinitis 11/16/2014   GAD (generalized anxiety disorder) 10/06/2014   ANA positive 10/06/2014    Allergies:  Allergies  Allergen Reactions   Peanut Oil Swelling    Eyelids swell.   Peanut-Containing Drug Products    Medications:  Current Outpatient Medications:    ondansetron  (ZOFRAN ) 4 MG tablet, Take 1 tablet (4 mg total) by mouth every 8 (eight) hours as needed for nausea or vomiting., Disp: 20  tablet, Rfl: 0   DULoxetine  (CYMBALTA ) 30 MG capsule, Take 3 capsules (90 mg total) by mouth daily., Disp: 270 capsule, Rfl: 0   EPINEPHrine  0.3 mg/0.3 mL IJ SOAJ injection, Inject 0.3 mg into the muscle as needed for anaphylaxis., Disp: 1 each, Rfl: 5   hydrOXYzine  (ATARAX ) 10 MG tablet, Take 1-2 tablets (10-20 mg total) by mouth daily., Disp: 90 tablet, Rfl: 0   levocetirizine (XYZAL ) 5 MG tablet, Take 1  tablet (5 mg total) by mouth every evening., Disp: 90 tablet, Rfl: 1   norethindrone-ethinyl estradiol-FE (LARIN FE 1/20) 1-20 MG-MCG tablet, Take 1 tablet by mouth daily. Skip placebo pills. Take continuously., Disp: 112 tablet, Rfl: 3   Olopatadine-Mometasone (RYALTRIS ) 665-25 MCG/ACT SUSP, Place 2 sprays into the nose 2 (two) times daily. (Patient taking differently: Place 2 sprays into the nose 2 (two) times daily as needed.), Disp: 29 g, Rfl: 1   Semaglutide -Weight Management (WEGOVY ) 2.4 MG/0.75ML SOAJ, Inject 2.4 mg into the skin once a week., Disp: 9 mL, Rfl: 0  Observations/Objective: Patient is well-developed, well-nourished in no acute distress.  Resting comfortably at home.  Head is normocephalic, atraumatic.  No labored breathing.  Speech is clear and coherent with logical content.  Patient is alert and oriented at baseline.    Assessment and Plan: 1. Nausea and vomiting, unspecified vomiting type (Primary) - ondansetron  (ZOFRAN ) 4 MG tablet; Take 1 tablet (4 mg total) by mouth every 8 (eight) hours as needed for nausea or vomiting.  Dispense: 20 tablet; Refill: 0  Supportive care discussed.  Advised to contact Wegovy  prescribing for further instructions for next weeks dose.   Follow Up Instructions: I discussed the assessment and treatment plan with the patient. The patient was provided an opportunity to ask questions and all were answered. The patient agreed with the plan and demonstrated an understanding of the instructions.  A copy of instructions were sent to the patient via MyChart unless otherwise noted below.     The patient was advised to call back or seek an in-person evaluation if the symptoms worsen or if the condition fails to improve as anticipated.    Harlene PEDLAR Ward, PA-C

## 2023-05-05 NOTE — Patient Instructions (Signed)
  Patricia Horn, thank you for joining Patricia Horn Ward, PA-C for today's virtual visit.  While this provider is not your primary care provider (PCP), if your PCP is located in our provider database this encounter information will be shared with them immediately following your visit.   A [facility] MyChart account gives you access to today's visit and all your visits, tests, and labs performed at [facility]  click here if you don't have a Patricia Horn MyChart account or go to [email or web address]  Consent: (Patient) Patricia Horn provided verbal consent for this virtual visit at the beginning of the encounter.  Current Medications:  Current Outpatient Medications:    ondansetron  (ZOFRAN ) 4 MG tablet, Take 1 tablet (4 mg total) by mouth every 8 (eight) hours as needed for nausea or vomiting., Disp: 20 tablet, Rfl: 0   DULoxetine  (CYMBALTA ) 30 MG capsule, Take 3 capsules (90 mg total) by mouth daily., Disp: 270 capsule, Rfl: 0   EPINEPHrine  0.3 mg/0.3 mL IJ SOAJ injection, Inject 0.3 mg into the muscle as needed for anaphylaxis., Disp: 1 each, Rfl: 5   hydrOXYzine  (ATARAX ) 10 MG tablet, Take 1-2 tablets (10-20 mg total) by mouth daily., Disp: 90 tablet, Rfl: 0   levocetirizine (XYZAL ) 5 MG tablet, Take 1 tablet (5 mg total) by mouth every evening., Disp: 90 tablet, Rfl: 1   norethindrone-ethinyl estradiol-FE (LARIN FE 1/20) 1-20 MG-MCG tablet, Take 1 tablet by mouth daily. Skip placebo pills. Take continuously., Disp: 112 tablet, Rfl: 3   Olopatadine-Mometasone (RYALTRIS ) 665-25 MCG/ACT SUSP, Place 2 sprays into the nose 2 (two) times daily. (Patient taking differently: Place 2 sprays into the nose 2 (two) times daily as needed.), Disp: 29 g, Rfl: 1   Semaglutide -Weight Management (WEGOVY ) 2.4 MG/0.75ML SOAJ, Inject 2.4 mg into the skin once a week., Disp: 9 mL, Rfl: 0   Medications ordered in this encounter:  Meds ordered this encounter  Medications   ondansetron  (ZOFRAN ) 4 MG  tablet    Sig: Take 1 tablet (4 mg total) by mouth every 8 (eight) hours as needed for nausea or vomiting.    Dispense:  20 tablet    Refill:  0    Supervising Provider:   LAMPTEY, PHILIP O [[id number]]     *If you need refills on other medications prior to your next appointment, please contact your pharmacy*  Follow-Up: Call back or seek an in-person evaluation if the symptoms worsen or if the condition fails to improve as anticipated.  [facility] [phone]  Other Instructions Can take Zofran  as needed for nausea.  Recommend Imodium as needed for diarrhea.  Drink plenty of fluids, recommend gatorade, pedialyte, etc. Contact Wegovy  prescriber for further instructions for next dose.    If you have been instructed to have an in-person evaluation today at a local Urgent Care facility, please use the link below. It will take you to a list of all of our available [facility], including address, phone number and hours of operation. Please do not delay care.  [facility]  If you or a family member do not have a primary care provider, use the link below to schedule a visit and establish care. When you choose a [facility] primary care physician or advanced practice provider, you gain a long-term partner in health. Find a Primary Care Provider  Learn more about [facility]'s in-office and virtual care options:  - Get Care Now

## 2023-05-05 NOTE — Telephone Encounter (Signed)
  Chief Complaint: Vomiting 15+ times today Symptoms: above Frequency: 10 am this morning Pertinent Negatives: Patient denies  Disposition: [] ED /[] Urgent Care (no appt availability in office) / [] Appointment(In office/virtual)/ [x]  [facility]/ [] Home Care/ [] Refused Recommended Disposition /[] [facility]/ []  Follow-up with PCP Additional Notes: Pt took her wegovy  shot this morning at 8am. Pt has been off of wegovy  for 1 month and restarted at 2.4mg . Pt started vomiting at 10 and states she has vomited 15+ times. Pt states that she used to vomit 1x or so after her wegovy  shots. No appts in office. Scheduled VV.     Summary: vomiting   Patient called sttd she has been vomiting since 10am and she hasn't eaten anything. She is requesting something be called in for her symptoms. Please f/u with patient      Answer Assessment - Initial Assessment Questions 1. VOMITING SEVERITY: How many times have you vomited in the past 24 hours?     - MILD:  1 - 2 times/day    - MODERATE: 3 - 5 times/day, decreased oral intake without significant weight loss or symptoms of dehydration    - SEVERE: 6 or more times/day, vomits everything or nearly everything, with significant weight loss, symptoms of dehydration      15+ 2. ONSET: When did the vomiting begin?      10 this morning 7. CAUSE: What do you think is causing your vomiting?     Restarted Wegovy  today at 2.4 mg dose after being off for `1 month  Protocols used: Vomiting-A-AH

## 2023-05-06 ENCOUNTER — Other Ambulatory Visit: Payer: Self-pay

## 2023-05-06 ENCOUNTER — Encounter: Payer: Self-pay | Admitting: Emergency Medicine

## 2023-05-06 ENCOUNTER — Emergency Department
Admission: EM | Admit: 2023-05-06 | Discharge: 2023-05-06 | Disposition: A | Discharge status: Home / Self Care | Payer: BC Managed Care – PPO | Attending: Emergency Medicine | Admitting: Emergency Medicine

## 2023-05-06 DIAGNOSIS — D72829 Elevated white blood cell count, unspecified: Secondary | ICD-10-CM | POA: Diagnosis not present

## 2023-05-06 DIAGNOSIS — R112 Nausea with vomiting, unspecified: Secondary | ICD-10-CM | POA: Diagnosis not present

## 2023-05-06 DIAGNOSIS — R197 Diarrhea, unspecified: Secondary | ICD-10-CM | POA: Insufficient documentation

## 2023-05-06 DIAGNOSIS — R000 Tachycardia, unspecified: Secondary | ICD-10-CM | POA: Insufficient documentation

## 2023-05-06 LAB — POC URINE PREG, ED: Preg Test, Ur: NEGATIVE

## 2023-05-06 LAB — URINALYSIS, ROUTINE W REFLEX MICROSCOPIC
Bacteria, UA: NONE SEEN
Bilirubin Urine: NEGATIVE
Glucose, UA: NEGATIVE mg/dL
Hgb urine dipstick: NEGATIVE
Ketones, ur: 80 mg/dL — AB
Leukocytes,Ua: NEGATIVE
Nitrite: NEGATIVE
Protein, ur: 100 mg/dL — AB
Specific Gravity, Urine: 1.032 — ABNORMAL HIGH (ref 1.005–1.030)
pH: 5 (ref 5.0–8.0)

## 2023-05-06 LAB — COMPREHENSIVE METABOLIC PANEL
ALT: 15 U/L (ref 0–44)
AST: 23 U/L (ref 15–41)
Albumin: 4 g/dL (ref 3.5–5.0)
Alkaline Phosphatase: 64 U/L (ref 38–126)
Anion gap: 14 (ref 5–15)
BUN: 15 mg/dL (ref 6–20)
CO2: 20 mmol/L — ABNORMAL LOW (ref 22–32)
Calcium: 9.2 mg/dL (ref 8.9–10.3)
Chloride: 103 mmol/L (ref 98–111)
Creatinine, Ser: 0.73 mg/dL (ref 0.44–1.00)
GFR, Estimated: >60 mL/min (ref >60)
Glucose, Bld: 104 mg/dL — ABNORMAL HIGH (ref 70–99)
Potassium: 3.4 mmol/L — ABNORMAL LOW (ref 3.5–5.1)
Sodium: 137 mmol/L (ref 135–145)
Total Bilirubin: 0.6 mg/dL (ref 0.0–1.2)
Total Protein: 8 g/dL (ref 6.5–8.1)

## 2023-05-06 LAB — CBC WITH DIFFERENTIAL/PLATELET
Abs Immature Granulocytes: 0.03 10*3/uL (ref 0.00–0.07)
Basophils Absolute: 0 10*3/uL (ref 0.0–0.1)
Basophils Relative: 0 %
Eosinophils Absolute: 0 10*3/uL (ref 0.0–0.5)
Eosinophils Relative: 0 %
HCT: 38.4 % (ref 36.0–46.0)
Hemoglobin: 13.3 g/dL (ref 12.0–15.0)
Immature Granulocytes: 0 %
Lymphocytes Relative: 12 %
Lymphs Abs: 1.5 10*3/uL (ref 0.7–4.0)
MCH: 32.3 pg (ref 26.0–34.0)
MCHC: 34.6 g/dL (ref 30.0–36.0)
MCV: 93.2 fL (ref 80.0–100.0)
Monocytes Absolute: 0.4 10*3/uL (ref 0.1–1.0)
Monocytes Relative: 3 %
Neutro Abs: 10.8 10*3/uL — ABNORMAL HIGH (ref 1.7–7.7)
Neutrophils Relative %: 85 %
Platelets: 482 10*3/uL — ABNORMAL HIGH (ref 150–400)
RBC: 4.12 MIL/uL (ref 3.87–5.11)
RDW: 12.8 % (ref 11.5–15.5)
WBC: 12.8 10*3/uL — ABNORMAL HIGH (ref 4.0–10.5)
nRBC: 0 % (ref 0.0–0.2)

## 2023-05-06 LAB — LIPASE, BLOOD: Lipase: 24 U/L (ref 11–51)

## 2023-05-06 MED ORDER — DROPERIDOL 2.5 MG/ML IJ SOLN
2.5000 mg | Freq: Once | INTRAMUSCULAR | Status: AC
  Administered 2023-05-06: 2.5 mg via INTRAMUSCULAR
  Filled 2023-05-06: qty 2

## 2023-05-06 MED ORDER — ONDANSETRON 4 MG PO TBDP
4.0000 mg | ORAL_TABLET | Freq: Once | ORAL | Status: DC
  Filled 2023-05-06: qty 1

## 2023-05-06 NOTE — ED Triage Notes (Signed)
 Patient ambulatory to triage with steady gait, without difficulty or distress noted; pt reports N/V since 10am; denies any pain

## 2023-05-06 NOTE — Discharge Instructions (Signed)
 Take Zofran as needed for nausea and vomiting.  Drink plenty of fluids to stay well-hydrated.  Find Pedialyte or similar electrolyte rehydration formulas at your local pharmacy.  Thank you for choosing [facility] for your health care today!  Please see your primary doctor this week for a follow up appointment.   If you have any new, worsening, or unexpected symptoms call your doctor right away or come back to the emergency department for reevaluation.  It was my pleasure to care for you today.   Daneil Dan Modesto Charon, MD

## 2023-05-06 NOTE — ED Provider Notes (Signed)
 [facility] Provider Note    Event Date/Time   First MD Initiated Contact with Patient 05/06/23 [phone]     (approximate)   History   Emesis   HPI  Patricia Horn is a 33 y.o. female   Past medical history of no significant past medical history presents emergency department with nausea vomiting diarrhea for the last 1 day.  Watery diarrhea, multiple episodes of emesis, no GI bleeding.  Crampy abdominal pain.  No dysuria frequency or vaginal discharge or bleeding  No known sick contacts.  No recent travel.   External Medical Documents Reviewed: Virtual visit notes from yesterday regarding nausea and vomiting      Physical Exam   Triage Vital Signs: ED Triage Vitals  Encounter Vitals Group     BP 05/06/23 0013 (!) 117/90     Systolic BP Percentile --      Diastolic BP Percentile --      Pulse Rate 05/06/23 0013 (!) 101     Resp 05/06/23 0013 16     Temp 05/06/23 0013 98.2 F (36.8 C)     Temp Source 05/06/23 0013 Oral     SpO2 05/06/23 0013 100 %     Weight 05/06/23 0010 165 lb (74.8 kg)     Height 05/06/23 0010 5' 2 (1.575 m)     Head Circumference --      Peak Flow --      Pain Score 05/06/23 0010 0     Pain Loc --      Pain Education --      Exclude from Growth Chart --     Most recent vital signs: Vitals:   05/06/23 0013  BP: (!) 117/90  Pulse: (!) 101  Resp: 16  Temp: 98.2 F (36.8 C)  SpO2: 100%    General: Awake, no distress.  CV:  Good peripheral perfusion.  Resp:  Normal effort.  Abd:  No distention.  Other:  Mildly tachycardic in the low 100s, normotensive, afebrile, nontoxic pleasant woman in no acute distress.  Soft benign abdominal exam deep palpation all quadrants.   ED Results / Procedures / Treatments   Labs (all labs ordered are listed, but only abnormal results are displayed) Labs Reviewed  CBC WITH DIFFERENTIAL/PLATELET - Abnormal; Notable for the following components:      Result Value   WBC 12.8 (*)     Platelets 482 (*)    Neutro Abs 10.8 (*)    All other components within normal limits  COMPREHENSIVE METABOLIC PANEL - Abnormal; Notable for the following components:   Potassium 3.4 (*)    CO2 20 (*)    Glucose, Bld 104 (*)    All other components within normal limits  URINALYSIS, ROUTINE W REFLEX MICROSCOPIC - Abnormal; Notable for the following components:   Color, Urine YELLOW (*)    APPearance CLOUDY (*)    Specific Gravity, Urine 1.032 (*)    Ketones, ur 80 (*)    Protein, ur 100 (*)    All other components within normal limits  LIPASE, BLOOD  POC URINE PREG, ED     I ordered and reviewed the above labs they are notable for white blood cell count slightly elevated at 12.  Otherwise cell counts electrolytes unremarkable.    PROCEDURES:  Critical Care performed: No  Procedures   MEDICATIONS ORDERED IN ED: Medications  ondansetron  (ZOFRAN -ODT) disintegrating tablet 4 mg (4 mg Oral Not Given 05/06/23 0556)  droperidol  (INAPSINE ) 2.5  MG/ML injection 2.5 mg (2.5 mg Intramuscular Given 05/06/23 0603)     IMPRESSION / MDM / ASSESSMENT AND PLAN / ED COURSE  I reviewed the triage vital signs and the nursing notes.                                Patient's presentation is most consistent with acute presentation with potential threat to life or bodily function.  Differential diagnosis includes, but is not limited to, viral gastroenteritis, intra-abdominal infection like appendicitis or cholecystitis, dehydration, electrolyte derangements adverse effect of medication Wegovy     MDM:    Nausea vomiting diarrhea in this patient who is otherwise well-appearing with a benign abdominal exam, most likely viral gastroenteritis less likely surgical abdominal pathologies.  Cell counts electrolytes largely unremarkable.  Given antiemetic.  Anticipatory guidance.  Considered adverse effect of Wegovy  but unlikely given the acuity of onset of symptoms.  She already has a prescription  for Zofran .  She will follow-up with MD.       FINAL CLINICAL IMPRESSION(S) / ED DIAGNOSES   Final diagnoses:  Nausea vomiting and diarrhea     Rx / DC Orders   ED Discharge Orders     None        Note:  This document was prepared using Dragon voice recognition software and may include unintentional dictation errors.    Cyrena Mylar, MD 05/06/23 [phone]

## 2023-05-07 NOTE — Telephone Encounter (Signed)
 FYI seen ER yesterday

## 2023-05-08 ENCOUNTER — Telehealth (INDEPENDENT_AMBULATORY_CARE_PROVIDER_SITE_OTHER): Payer: BC Managed Care – PPO | Admitting: Family Medicine

## 2023-05-08 ENCOUNTER — Encounter: Payer: Self-pay | Admitting: Family Medicine

## 2023-05-08 DIAGNOSIS — D72829 Elevated white blood cell count, unspecified: Secondary | ICD-10-CM | POA: Diagnosis not present

## 2023-05-08 DIAGNOSIS — K529 Noninfective gastroenteritis and colitis, unspecified: Secondary | ICD-10-CM

## 2023-05-08 DIAGNOSIS — R112 Nausea with vomiting, unspecified: Secondary | ICD-10-CM | POA: Diagnosis not present

## 2023-05-08 DIAGNOSIS — R197 Diarrhea, unspecified: Secondary | ICD-10-CM | POA: Diagnosis not present

## 2023-05-08 MED ORDER — SEMAGLUTIDE-WEIGHT MANAGEMENT 1.7 MG/0.75ML ~~LOC~~ SOAJ: 1.7000 mg | SUBCUTANEOUS | 0 refills | Status: DC

## 2023-05-08 MED ORDER — SEMAGLUTIDE-WEIGHT MANAGEMENT 1 MG/0.5ML ~~LOC~~ SOAJ: 1.0000 mg | SUBCUTANEOUS | 0 refills | Status: DC

## 2023-05-08 NOTE — Progress Notes (Signed)
 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  I discussed the limitations of evaluation and management by telemedicine and the availability of in person appointments. The patient expressed understanding and agreed to proceed with a virtual visit  Staff also discussed with the patient that there may be a patient responsible charge related to this service. Patient Location: at ome  Provider Location: [facility] Additional Individuals present: none  HPI  Discussed the use of AI scribe software for clinical note transcription with the patient, who gave verbal consent to proceed.  History of Present Illness   The patient, with a history of allergies and on Wegovy  treatment, presented with an acute onset of nausea, vomiting, and diarrhea to the [facility] on 05/06/2023. The symptoms started on the same day as her first Wegovy  injection after a gap of over two weeks due to insurance and pharmacy issues on Dec 31st 2024. The patient reported severe vomiting, lasting from 10 AM to 2 AM, which was unresponsive to Zofran . The severity of the symptoms prompted an ER visit, where she received a shot of Zofran .  Post-ER visit, the patient reported persistent queasiness, requiring regular Zofran  intake. The vomiting had ceased, and the diarrhea had resolved. However, the patient was still unable to consume solid food and was maintaining hydration with broth. The patient expressed apprehension about resuming the Wegovy  treatment due to the severe vomiting episode.  The patient denied any fever or chills post-ER visit. There was no report of anyone else in  her vicinity falling ill, suggesting the possibility that the symptoms might have been a reaction to the Wegovy  injection.          Patient Active Problem List   Diagnosis Date Noted   MDD (major depressive disorder), recurrent episode, moderate (HCC) 10/22/2020   Environmental allergies 04/19/2020   Allergy to cats 04/19/2020   Uterine leiomyoma 03/02/2019   Bilateral hand pain 12/28/2017   Right carpal tunnel syndrome 11/10/2016   Urticaria 11/09/2015   Obesity (BMI 35.0-39.9 without comorbidity) 01/05/2015   Umbilical cyst 11/30/2014   Perennial allergic rhinitis 11/16/2014   GAD (generalized anxiety disorder) 10/06/2014   ANA positive 10/06/2014    Social History   Tobacco Use   Smoking status: Never   Smokeless tobacco: Never  Substance Use Topics   Alcohol use: Yes    Alcohol/week: 1.0 standard drink of alcohol    Types: 1 Standard drinks or equivalent per week    Comment: ocassional     Current Outpatient Medications:    DULoxetine  (CYMBALTA ) 30 MG capsule, Take 3 capsules (90 mg total) by mouth daily., Disp: 270 capsule, Rfl: 0   EPINEPHrine  0.3 mg/0.3 mL IJ SOAJ injection, Inject 0.3 mg into the muscle as needed for anaphylaxis., Disp: 1 each, Rfl: 5   hydrOXYzine  (ATARAX ) 10 MG tablet, Take 1-2 tablets (10-20 mg total) by mouth daily., Disp: 90 tablet, Rfl: 0   levocetirizine (XYZAL ) 5 MG tablet, Take 1 tablet (5 mg total) by mouth every evening., Disp: 90 tablet, Rfl: 1   norethindrone-ethinyl  estradiol-FE (LARIN FE 1/20) 1-20 MG-MCG tablet, Take 1 tablet by mouth daily. Skip placebo pills. Take continuously., Disp: 112 tablet, Rfl: 3   Olopatadine-Mometasone (RYALTRIS ) 665-25 MCG/ACT SUSP, Place 2 sprays into the nose 2 (two) times daily. (Patient taking differently: Place 2 sprays into the nose 2 (two) times daily as needed.), Disp: 29 g, Rfl: 1   omeprazole  (PRILOSEC) 40 MG capsule, Take 40 mg by mouth daily., Disp: , Rfl:    ondansetron  (ZOFRAN ) 4 MG tablet,  Take 1 tablet (4 mg total) by mouth every 8 (eight) hours as needed for nausea or vomiting., Disp: 20 tablet, Rfl: 0   Semaglutide -Weight Management (WEGOVY ) 2.4 MG/0.75ML SOAJ, Inject 2.4 mg into the skin once a week., Disp: 9 mL, Rfl: 0   [START ON 07/05/2023] Semaglutide -Weight Management 1 MG/0.5ML SOAJ, Inject 1 mg into the skin once a week for 28 days., Disp: 2 mL, Rfl: 0   [START ON 08/03/2023] Semaglutide -Weight Management 1.7 MG/0.75ML SOAJ, Inject 1.7 mg into the skin once a week for 28 days., Disp: 3 mL, Rfl: 0  Allergies  Allergen Reactions   Peanut Oil Swelling    Eyelids swell.   Peanut-Containing Drug Products     I personally reviewed active problem list, medication list, allergies with the patient/caregiver today.  ROS  Ten systems reviewed and is negative except as mentioned in HPI    Objective  Virtual encounter, vitals not obtained.  There is no height or weight on file to calculate BMI.  Nursing Note and Vital Signs reviewed.  Physical Exam  Awake, alert and oriented   Results for orders placed or performed during the hospital encounter of 05/06/23 (from the past 72 hours)  Urinalysis, Routine w reflex microscopic -Urine, Clean Catch     Status: Abnormal   Collection Time: 05/06/23 12:02 AM  Result Value Ref Range   Color, Urine YELLOW (A) YELLOW   APPearance CLOUDY (A) CLEAR   Specific Gravity, Urine 1.032 (H) 1.005 - 1.030   pH 5.0 5.0 - 8.0   Glucose, UA NEGATIVE NEGATIVE mg/dL   Hgb urine dipstick NEGATIVE NEGATIVE   Bilirubin Urine NEGATIVE NEGATIVE   Ketones, ur 80 (A) NEGATIVE mg/dL   Protein, ur 899 (A) NEGATIVE mg/dL   Nitrite NEGATIVE NEGATIVE   Leukocytes,Ua NEGATIVE NEGATIVE   RBC / HPF 6-10 0 - 5 RBC/hpf   WBC, UA 0-5 0 - 5 WBC/hpf   Bacteria, UA NONE SEEN NONE SEEN   Squamous Epithelial / HPF 6-10 0 - 5 /HPF   Mucus PRESENT     Comment: Performed at [facility], [address] [place], [place] [address]  CBC with  Differential     Status: Abnormal   Collection Time: 05/06/23 12:15 AM  Result Value Ref Range   WBC 12.8 (H) 4.0 - 10.5 K/uL   RBC 4.12 3.87 - 5.11 MIL/uL   Hemoglobin 13.3 12.0 - 15.0 g/dL   HCT 61.5 63.9 - 53.9 %   MCV 93.2 80.0 - 100.0 fL   MCH 32.3 26.0 - 34.0 pg   MCHC 34.6 30.0 - 36.0 g/dL   RDW 87.1 88.4 - 84.4 %   Platelets 482 (H) 150 - 400 K/uL   nRBC 0.0 0.0 - 0.2 %   Neutrophils Relative % 85 %   Neutro Abs 10.8 (H) 1.7 - 7.7 K/uL   Lymphocytes Relative 12 %   Lymphs Abs 1.5 0.7 - 4.0 K/uL   Monocytes Relative 3 %   Monocytes Absolute  0.4 0.1 - 1.0 K/uL   Eosinophils Relative 0 %   Eosinophils Absolute 0.0 0.0 - 0.5 K/uL   Basophils Relative 0 %   Basophils Absolute 0.0 0.0 - 0.1 K/uL   Immature Granulocytes 0 %   Abs Immature Granulocytes 0.03 0.00 - 0.07 K/uL    Comment: Performed at [facility], [address] [place], [place] [address]  Comprehensive metabolic panel     Status: Abnormal   Collection Time: 05/06/23 12:15 AM  Result Value Ref Range   Sodium 137 135 - 145 mmol/L   Potassium 3.4 (L) 3.5 - 5.1 mmol/L   Chloride 103 98 - 111 mmol/L   CO2 20 (L) 22 - 32 mmol/L   Glucose, Bld 104 (H) 70 - 99 mg/dL    Comment: Glucose reference range applies only to samples taken after fasting for at least 8 hours.   BUN 15 6 - 20 mg/dL   Creatinine, Ser 9.26 0.44 - 1.00 mg/dL   Calcium 9.2 8.9 - 89.6 mg/dL   Total Protein 8.0 6.5 - 8.1 g/dL   Albumin 4.0 3.5 - 5.0 g/dL   AST 23 15 - 41 U/L   ALT 15 0 - 44 U/L   Alkaline Phosphatase 64 38 - 126 U/L   Total Bilirubin 0.6 0.0 - 1.2 mg/dL   GFR, Estimated >39 >39 mL/min    Comment: (NOTE) Calculated using the CKD-EPI Creatinine Equation (2021)    Anion gap 14 5 - 15    Comment: Performed at [facility], [address] [place], [place] [address]  Lipase, blood     Status: None   Collection Time: 05/06/23 12:15 AM  Result Value Ref Range   Lipase 24 11 - 51 U/L    Comment: Performed at  [facility], [address] [place], [place] [address]  POC Urine Pregnancy, ED     Status: None   Collection Time: 05/06/23  5:32 AM  Result Value Ref Range   Preg Test, Ur Negative Negative    Assessment & Plan  Assessment and Plan    Acute Gastroenteritis Recent episode of nausea, vomiting, and diarrhea. Symptoms improved but still experiencing queasiness. Elevated WBC and neutrophils on recent labs, suggesting possible infection. -Continue Zofran  as needed for nausea. -Check WBC and kidney function today to rule out ongoing infection and dehydration.  Wegovy  (Semaglutide ) Administration Recent re-initiation of Wegovy  at 2.4mg  after a gap of over two weeks due to insurance and pharmacy issues. Possible the cause of symptoms for taking a high dose after a long gap of not using medication -Reduce Wegovy  dose to 1mg  for two weeks, then increase to 1.7mg  for two weeks, and then resume 2.4mg  as tolerated. -Do not administer Wegovy  on 05/12/2023 if not feeling 100%. -Administer Wegovy  on 05/13/2023 or 05/14/2023 if feeling better. -Continue to monitor for side effects and adjust dose as needed.            -Red flags and when to present for emergency care or RTC including fever >101.73F, chest pain, shortness of breath, new/worsening/un-resolving symptoms,  reviewed with patient at time of visit. Follow up and care instructions discussed and provided in AVS. - I discussed the assessment and treatment plan with the patient. The patient was provided an opportunity to ask questions and all were answered. The patient agreed with the plan and demonstrated an understanding of the instructions.  I provided 15  minutes of non-face-to-face time during this encounter.  Yerachmiel Spinney F  Glenard, MD

## 2023-05-09 LAB — CBC WITH DIFFERENTIAL/PLATELET
Absolute Lymphocytes: 3044 {cells}/uL (ref 850–3900)
Absolute Monocytes: 552 {cells}/uL (ref 200–950)
Basophils Absolute: 27 {cells}/uL (ref 0–200)
Basophils Relative: 0.3 %
Eosinophils Absolute: 62 {cells}/uL (ref 15–500)
Eosinophils Relative: 0.7 %
HCT: 39.5 % (ref 35.0–45.0)
Hemoglobin: 13.1 g/dL (ref 11.7–15.5)
MCH: 31.1 pg (ref 27.0–33.0)
MCHC: 33.2 g/dL (ref 32.0–36.0)
MCV: 93.8 fL (ref 80.0–100.0)
MPV: 9.5 fL (ref 7.5–12.5)
Monocytes Relative: 6.2 %
Neutro Abs: 5215 {cells}/uL (ref 1500–7800)
Neutrophils Relative %: 58.6 %
Platelets: 496 10*3/uL — ABNORMAL HIGH (ref 140–400)
RBC: 4.21 10*6/uL (ref 3.80–5.10)
RDW: 11.9 % (ref 11.0–15.0)
Total Lymphocyte: 34.2 %
WBC: 8.9 10*3/uL (ref 3.8–10.8)

## 2023-05-09 LAB — BASIC METABOLIC PANEL WITH GFR
BUN: 18 mg/dL (ref 7–25)
CO2: 24 mmol/L (ref 20–32)
Calcium: 9.8 mg/dL (ref 8.6–10.2)
Chloride: 102 mmol/L (ref 98–110)
Creat: 0.96 mg/dL (ref 0.50–0.97)
Glucose, Bld: 90 mg/dL (ref 65–99)
Potassium: 4.1 mmol/L (ref 3.5–5.3)
Sodium: 138 mmol/L (ref 135–146)
eGFR: 80 mL/min/{1.73_m2} (ref >=60)

## 2023-05-11 ENCOUNTER — Encounter: Payer: Self-pay | Admitting: Family Medicine

## 2023-05-14 ENCOUNTER — Ambulatory Visit (INDEPENDENT_AMBULATORY_CARE_PROVIDER_SITE_OTHER): Payer: BC Managed Care – PPO

## 2023-05-14 DIAGNOSIS — Z2989 Encounter for other specified prophylactic measures: Secondary | ICD-10-CM

## 2023-05-14 NOTE — Progress Notes (Signed)
 Return pt here for allergy injections. Pt has epi pen for the appointment. No previous reactions to the injection were reported. Pt is well, no complaints. 3mm wheal noted after the 30 min wait period.

## 2023-05-21 ENCOUNTER — Ambulatory Visit: Payer: BC Managed Care – PPO

## 2023-05-21 DIAGNOSIS — Z2989 Encounter for other specified prophylactic measures: Secondary | ICD-10-CM | POA: Diagnosis not present

## 2023-05-21 NOTE — Progress Notes (Signed)
 Return pt here for allergy injections. Pt has epi pen for the appointment. No previous reactions to the injection were reported. Pt is well, no complaints. No reaction noted after the 30 min wait period.

## 2023-05-26 ENCOUNTER — Encounter: Payer: Self-pay | Admitting: Family Medicine

## 2023-05-26 ENCOUNTER — Other Ambulatory Visit: Payer: Self-pay | Admitting: Family Medicine

## 2023-05-26 DIAGNOSIS — Z8639 Personal history of other endocrine, nutritional and metabolic disease: Secondary | ICD-10-CM

## 2023-05-26 DIAGNOSIS — E66811 Obesity, class 1: Secondary | ICD-10-CM

## 2023-05-26 MED ORDER — SEMAGLUTIDE-WEIGHT MANAGEMENT 1 MG/0.5ML ~~LOC~~ SOAJ: 1.0000 mg | SUBCUTANEOUS | 0 refills | Status: DC

## 2023-05-26 MED ORDER — SEMAGLUTIDE-WEIGHT MANAGEMENT 1.7 MG/0.75ML ~~LOC~~ SOAJ
1.7000 mg | SUBCUTANEOUS | 0 refills | Status: DC
Start: 2023-08-03 — End: 2023-07-01

## 2023-05-28 ENCOUNTER — Ambulatory Visit: Payer: BC Managed Care – PPO

## 2023-05-29 ENCOUNTER — Ambulatory Visit (INDEPENDENT_AMBULATORY_CARE_PROVIDER_SITE_OTHER): Payer: BC Managed Care – PPO

## 2023-05-29 DIAGNOSIS — Z2989 Encounter for other specified prophylactic measures: Secondary | ICD-10-CM

## 2023-05-29 NOTE — Progress Notes (Signed)
Return pt here for allergy injections. Pt has epi pen for the appointment. No previous reactions to the injection were reported. Pt is well, no complaints. No reaction noted after the 30 min wait period.

## 2023-06-03 DIAGNOSIS — F331 Major depressive disorder, recurrent, moderate: Secondary | ICD-10-CM | POA: Diagnosis not present

## 2023-06-04 ENCOUNTER — Ambulatory Visit: Payer: BC Managed Care – PPO

## 2023-06-04 DIAGNOSIS — Z2989 Encounter for other specified prophylactic measures: Secondary | ICD-10-CM

## 2023-06-04 NOTE — Progress Notes (Signed)
Return pt here for allergy injections. Pt has epi pen for the appointment. No previous reactions to the injection were reported. Pt is well, no complaints. 10mm flare noted after the 30 min wait period.

## 2023-06-11 ENCOUNTER — Ambulatory Visit: Payer: BC Managed Care – PPO

## 2023-06-11 DIAGNOSIS — Z2989 Encounter for other specified prophylactic measures: Secondary | ICD-10-CM

## 2023-06-11 NOTE — Progress Notes (Signed)
 Return pt here for allergy injections. Pt has epi pen for the appointment. No previous reactions to the injection were reported. Pt is well, no complaints. No reaction noted after the 30 min wait period.

## 2023-06-12 DIAGNOSIS — J301 Allergic rhinitis due to pollen: Secondary | ICD-10-CM | POA: Diagnosis not present

## 2023-06-17 ENCOUNTER — Ambulatory Visit (INDEPENDENT_AMBULATORY_CARE_PROVIDER_SITE_OTHER): Payer: BC Managed Care – PPO

## 2023-06-17 DIAGNOSIS — Z2989 Encounter for other specified prophylactic measures: Secondary | ICD-10-CM

## 2023-06-17 NOTE — Progress Notes (Signed)
Return pt here for allergy injections. Pt has epi pen for the appointment. No previous reactions to the injection were reported. Pt is well, no complaints. 3mm wheal noted after the 30 min wait period.

## 2023-06-18 ENCOUNTER — Ambulatory Visit: Payer: BC Managed Care – PPO

## 2023-06-25 ENCOUNTER — Ambulatory Visit: Payer: BC Managed Care – PPO

## 2023-06-26 ENCOUNTER — Ambulatory Visit: Payer: Self-pay | Admitting: Family Medicine

## 2023-06-26 ENCOUNTER — Ambulatory Visit (INDEPENDENT_AMBULATORY_CARE_PROVIDER_SITE_OTHER): Payer: BC Managed Care – PPO

## 2023-06-26 DIAGNOSIS — Z2989 Encounter for other specified prophylactic measures: Secondary | ICD-10-CM | POA: Diagnosis not present

## 2023-06-26 NOTE — Progress Notes (Signed)
Return pt here for allergy injections. Pt has epi pen for the appointment. No previous reactions to the injection were reported. Pt is well, no complaints. 2mm wheal noted after the 30 min wait period.

## 2023-07-01 ENCOUNTER — Ambulatory Visit: Payer: BC Managed Care – PPO | Admitting: Family Medicine

## 2023-07-01 VITALS — BP 102/70 | HR 99 | Temp 98.0°F | Resp 16 | Ht 62.0 in | Wt 164.1 lb

## 2023-07-01 DIAGNOSIS — Z6830 Body mass index (BMI) 30.0-30.9, adult: Secondary | ICD-10-CM

## 2023-07-01 DIAGNOSIS — Z9109 Other allergy status, other than to drugs and biological substances: Secondary | ICD-10-CM | POA: Diagnosis not present

## 2023-07-01 DIAGNOSIS — E66811 Obesity, class 1: Secondary | ICD-10-CM

## 2023-07-01 DIAGNOSIS — F411 Generalized anxiety disorder: Secondary | ICD-10-CM

## 2023-07-01 DIAGNOSIS — F331 Major depressive disorder, recurrent, moderate: Secondary | ICD-10-CM | POA: Diagnosis not present

## 2023-07-01 MED ORDER — DULOXETINE HCL 30 MG PO CPEP
90.0000 mg | ORAL_CAPSULE | Freq: Every day | ORAL | 0 refills | Status: DC
Start: 2023-07-01 — End: 2023-09-30

## 2023-07-01 MED ORDER — WEGOVY 1.7 MG/0.75ML ~~LOC~~ SOAJ: 1.7000 mg | SUBCUTANEOUS | 0 refills | Status: DC

## 2023-07-01 NOTE — Progress Notes (Signed)
 Name: Patricia Horn   MRN: [id number]    DOB: 19-Nov-1990   Date:07/01/2023       Progress Note  Subjective  Chief Complaint  Chief Complaint  Patient presents with   Medical Management of Chronic Issues   HPI   Obesity: her weight in high school was around 130-135 lbs, that was her ideal body weight - BMI below 25. Around senior in high school through freshman year in college her weight went up to 200 lbs. After that her weight has been up and down Her weight was between 170-174 lbs from 2022 and May 2023. Up to 187 lbs in August and it spiked to 194 lbs in Nov, that was  at 24 lbs weight gain in one year . BMI is above 35. She resumed her personal training sessions Nov 2023 and was going to weight loss clinic and getting Ozempic shots, but since Feb 2024 she is getting Reginal Lutes ( now covered by insurance ) weight was 194 lbs and is down to 157 lbs on her last visit 3 months ago, however had a viral gastroenteritis and had to stop medication , she had to titrate Wegovy dose again, and currently at 1 mg weekly, weight went up an is now at 164.8 lbs.    Depression/ Major-recurrent currently mild,  GAD:  she also has seasonal affective disorder.  She has social anxiety and has difficulty going on dates but would like to find a partner, get married and have children. She is seeing a therapist Harrold Donath) we discussed CBT. She is compliant with Duloxetine 90 mg daily. She is is worried about current state of the country. She is going to move in April - buying a new house, closing in April.    Insomnia: she has been taking either Tylenol sleep aid, and occasionally takes hydroxyzine prn  Environmental Allergies: she sees Dr. Wardell Heath and having allergy shots , currently getting at work , [employer]. Tolerating it well    Patient Active Problem List   Diagnosis Date Noted   MDD (major depressive disorder), recurrent episode, moderate (HCC) 10/22/2020   Environmental allergies 04/19/2020   Allergy  to cats 04/19/2020   Uterine leiomyoma 03/02/2019   Bilateral hand pain 12/28/2017   Right carpal tunnel syndrome 11/10/2016   Urticaria 11/09/2015   Obesity (BMI 35.0-39.9 without comorbidity) 01/05/2015   Umbilical cyst 11/30/2014   Perennial allergic rhinitis 11/16/2014   GAD (generalized anxiety disorder) 10/06/2014   ANA positive 10/06/2014    Past Surgical History:  Procedure Laterality Date   HERNIA REPAIR  1995   umbilical    MYOMECTOMY  2015    Family History  Problem Relation Age of Onset   Hypertension Mother    Scleroderma Mother    Pulmonary fibrosis Mother    Asthma Mother    Cancer Neg Hx     Social History   Tobacco Use   Smoking status: Never   Smokeless tobacco: Never  Substance Use Topics   Alcohol use: Yes    Alcohol/week: 1.0 standard drink of alcohol    Types: 1 Standard drinks or equivalent per week    Comment: ocassional     Current Outpatient Medications:    DULoxetine (CYMBALTA) 30 MG capsule, Take 3 capsules (90 mg total) by mouth daily., Disp: 270 capsule, Rfl: 0   EPINEPHrine 0.3 mg/0.3 mL IJ SOAJ injection, Inject 0.3 mg into the muscle as needed for anaphylaxis., Disp: 1 each, Rfl: 5   hydrOXYzine (  ATARAX) 10 MG tablet, Take 1-2 tablets (10-20 mg total) by mouth daily., Disp: 90 tablet, Rfl: 0   levocetirizine (XYZAL) 5 MG tablet, Take 1 tablet (5 mg total) by mouth every evening., Disp: 90 tablet, Rfl: 1   norethindrone-ethinyl estradiol-FE (LARIN FE 1/20) 1-20 MG-MCG tablet, Take 1 tablet by mouth daily. Skip placebo pills. Take continuously., Disp: 112 tablet, Rfl: 3   Olopatadine-Mometasone (RYALTRIS) 665-25 MCG/ACT SUSP, Place 2 sprays into the nose 2 (two) times daily. (Patient taking differently: Place 2 sprays into the nose 2 (two) times daily as needed.), Disp: 29 g, Rfl: 1   omeprazole (PRILOSEC) 40 MG capsule, Take 40 mg by mouth daily., Disp: , Rfl:    [START ON 07/05/2023] Semaglutide-Weight Management 1 MG/0.5ML SOAJ, Inject  1 mg into the skin once a week for 28 days., Disp: 2 mL, Rfl: 0   ondansetron (ZOFRAN) 4 MG tablet, Take 1 tablet (4 mg total) by mouth every 8 (eight) hours as needed for nausea or vomiting. (Patient not taking: Reported on 07/01/2023), Disp: 20 tablet, Rfl: 0   [START ON 08/03/2023] Semaglutide-Weight Management 1.7 MG/0.75ML SOAJ, Inject 1.7 mg into the skin once a week for 28 days. (Patient not taking: Reported on 07/01/2023), Disp: 3 mL, Rfl: 0  Allergies  Allergen Reactions   Peanut Oil Swelling    Eyelids swell.   Peanut-Containing Drug Products     I personally reviewed active problem list, medication list, allergies, family history with the patient/caregiver today.   ROS  Ten systems reviewed and is negative except as mentioned in HPI    Objective  Vitals:   07/01/23 0841  BP: 102/70  Pulse: 99  Resp: 16  Temp: 98 F (36.7 C)  TempSrc: Oral  SpO2: 98%  Weight: 164 lb 1.6 oz (74.4 kg)  Height: 5\' 2"  (1.575 m)    Body mass index is 30.01 kg/m.  Physical Exam  Constitutional: Patient appears well-developed and well-nourished. Obese  No distress.  HEENT: head atraumatic, normocephalic, pupils equal and reactive to light, neck supple Cardiovascular: Normal rate, regular rhythm and normal heart sounds.  No murmur heard. No BLE edema. Pulmonary/Chest: Effort normal and breath sounds normal. No respiratory distress. Abdominal: Soft.  There is no tenderness. Psychiatric: Patient has a normal mood and affect. behavior is normal. Judgment and thought content normal.   Recent Results (from the past 2160 hours)  Urinalysis, Routine w reflex microscopic -Urine, Clean Catch     Status: Abnormal   Collection Time: 05/06/23 12:02 AM  Result Value Ref Range   Color, Urine YELLOW (A) YELLOW   APPearance CLOUDY (A) CLEAR   Specific Gravity, Urine 1.032 (H) 1.005 - 1.030   pH 5.0 5.0 - 8.0   Glucose, UA NEGATIVE NEGATIVE mg/dL   Hgb urine dipstick NEGATIVE NEGATIVE   Bilirubin  Urine NEGATIVE NEGATIVE   Ketones, ur 80 (A) NEGATIVE mg/dL   Protein, ur 045 (A) NEGATIVE mg/dL   Nitrite NEGATIVE NEGATIVE   Leukocytes,Ua NEGATIVE NEGATIVE   RBC / HPF 6-10 0 - 5 RBC/hpf   WBC, UA 0-5 0 - 5 WBC/hpf   Bacteria, UA NONE SEEN NONE SEEN   Squamous Epithelial / HPF 6-10 0 - 5 /HPF   Mucus PRESENT     Comment: Performed at [facility], [address] [place], [place] [address]  CBC with Differential     Status: Abnormal   Collection Time: 05/06/23 12:15 AM  Result Value Ref Range   WBC 12.8 (H)  4.0 - 10.5 K/uL   RBC 4.12 3.87 - 5.11 MIL/uL   Hemoglobin 13.3 12.0 - 15.0 g/dL   HCT 16.1 09.6 - 04.5 %   MCV 93.2 80.0 - 100.0 fL   MCH 32.3 26.0 - 34.0 pg   MCHC 34.6 30.0 - 36.0 g/dL   RDW 40.9 81.1 - 91.4 %   Platelets 482 (H) 150 - 400 K/uL   nRBC 0.0 0.0 - 0.2 %   Neutrophils Relative % 85 %   Neutro Abs 10.8 (H) 1.7 - 7.7 K/uL   Lymphocytes Relative 12 %   Lymphs Abs 1.5 0.7 - 4.0 K/uL   Monocytes Relative 3 %   Monocytes Absolute 0.4 0.1 - 1.0 K/uL   Eosinophils Relative 0 %   Eosinophils Absolute 0.0 0.0 - 0.5 K/uL   Basophils Relative 0 %   Basophils Absolute 0.0 0.0 - 0.1 K/uL   Immature Granulocytes 0 %   Abs Immature Granulocytes 0.03 0.00 - 0.07 K/uL    Comment: Performed at [facility], [address] [place], [place] [address]  Comprehensive metabolic panel     Status: Abnormal   Collection Time: 05/06/23 12:15 AM  Result Value Ref Range   Sodium 137 135 - 145 mmol/L   Potassium 3.4 (L) 3.5 - 5.1 mmol/L   Chloride 103 98 - 111 mmol/L   CO2 20 (L) 22 - 32 mmol/L   Glucose, Bld 104 (H) 70 - 99 mg/dL    Comment: Glucose reference range applies only to samples taken after fasting for at least 8 hours.   BUN 15 6 - 20 mg/dL   Creatinine, Ser 6.21 0.44 - 1.00 mg/dL   Calcium 9.2 8.9 - 30.8 mg/dL   Total Protein 8.0 6.5 - 8.1 g/dL   Albumin 4.0 3.5 - 5.0 g/dL   AST 23 15 - 41 U/L   ALT 15 0 - 44 U/L   Alkaline Phosphatase 64  38 - 126 U/L   Total Bilirubin 0.6 0.0 - 1.2 mg/dL   GFR, Estimated >65 >78 mL/min    Comment: (NOTE) Calculated using the CKD-EPI Creatinine Equation (2021)    Anion gap 14 5 - 15    Comment: Performed at [facility], [address] [place], [place] [address]  Lipase, blood     Status: None   Collection Time: 05/06/23 12:15 AM  Result Value Ref Range   Lipase 24 11 - 51 U/L    Comment: Performed at [facility], [address] [place], [place] [address]  POC Urine Pregnancy, ED     Status: None   Collection Time: 05/06/23  5:32 AM  Result Value Ref Range   Preg Test, Ur Negative Negative  CBC with Differential/Platelet     Status: Abnormal   Collection Time: 05/08/23  1:48 PM  Result Value Ref Range   WBC 8.9 3.8 - 10.8 Thousand/uL   RBC 4.21 3.80 - 5.10 Million/uL   Hemoglobin 13.1 11.7 - 15.5 g/dL   HCT 13.2 44.0 - 10.2 %   MCV 93.8 80.0 - 100.0 fL   MCH 31.1 27.0 - 33.0 pg   MCHC 33.2 32.0 - 36.0 g/dL    Comment: For adults, a slight decrease in the calculated MCHC value (in the range of 30 to 32 g/dL) is most likely not clinically significant; however, it should be interpreted with caution in correlation with other red cell parameters and the patient's clinical condition.    RDW 11.9 11.0 -  15.0 %   Platelets 496 (H) 140 - 400 Thousand/uL   MPV 9.5 7.5 - 12.5 fL   Neutro Abs 5,215 1,500 - 7,800 cells/uL   Absolute Lymphocytes 3,044 850 - 3,900 cells/uL   Absolute Monocytes 552 200 - 950 cells/uL   Eosinophils Absolute 62 15 - 500 cells/uL   Basophils Absolute 27 0 - 200 cells/uL   Neutrophils Relative % 58.6 %   Total Lymphocyte 34.2 %   Monocytes Relative 6.2 %   Eosinophils Relative 0.7 %   Basophils Relative 0.3 %  BASIC METABOLIC PANEL WITH GFR     Status: None   Collection Time: 05/08/23  1:48 PM  Result Value Ref Range   Glucose, Bld 90 65 - 99 mg/dL    Comment: .            Fasting reference interval .    BUN 18 7 - 25 mg/dL    Creat 5.78 4.69 - 6.29 mg/dL   eGFR 80 > OR = 60 BM/WUX/3.24M0   BUN/Creatinine Ratio SEE NOTE: 6 - 22 (calc)    Comment:    Not Reported: BUN and Creatinine are within    reference range. .    Sodium 138 135 - 146 mmol/L   Potassium 4.1 3.5 - 5.3 mmol/L   Chloride 102 98 - 110 mmol/L   CO2 24 20 - 32 mmol/L   Calcium 9.8 8.6 - 10.2 mg/dL    Diabetic Foot Exam:     PHQ2/9:    07/01/2023    8:40 AM 05/08/2023    7:33 AM 03/26/2023   11:47 AM 12/25/2022   10:56 AM 10/31/2022    3:33 PM  Depression screen PHQ 2/9  Decreased Interest 0 0 0 0 0  Down, Depressed, Hopeless 0 0 0 0 0  PHQ - 2 Score 0 0 0 0 0  Altered sleeping 0 0 3 0 0  Tired, decreased energy 0 0 3 0 3  Change in appetite 0 0 2 0 0  Feeling bad or failure about yourself  0 0 0 0 0  Trouble concentrating 0 0 0 0 0  Moving slowly or fidgety/restless 0 0 0 0 0  Suicidal thoughts 0 0 0 0 0  PHQ-9 Score 0 0 8 0 3  Difficult doing work/chores Not difficult at all Not difficult at all Somewhat difficult      phq 9 is negative  Fall Risk:    05/08/2023    7:33 AM 03/26/2023   11:47 AM 12/25/2022   10:56 AM 10/31/2022    3:33 PM 08/01/2022    1:30 PM  Fall Risk   Falls in the past year? 0 0 0 0 0  Number falls in past yr: 0   0   Injury with Fall? 0   0   Risk for fall due to : No Fall Risks No Fall Risks No Fall Risks No Fall Risks No Fall Risks  Follow up Falls prevention discussed;Education provided;Falls evaluation completed Falls prevention discussed Falls prevention discussed Falls prevention discussed Falls prevention discussed     Assessment & Plan  1. Obesity (BMI 30.0-34.9) (Primary)  - Semaglutide-Weight Management (WEGOVY) 1.7 MG/0.75ML SOAJ; Inject 1.7 mg into the skin once a week.  Dispense: 9 mL; Refill: 0  2. MDD (major depressive disorder), recurrent episode, moderate (HCC)  Continue Duloxetine  3. GAD (generalized anxiety disorder)  Continue Duloxetine  4. Environmental allergies    Getting Allergy shots

## 2023-07-02 ENCOUNTER — Ambulatory Visit (INDEPENDENT_AMBULATORY_CARE_PROVIDER_SITE_OTHER): Payer: BC Managed Care – PPO

## 2023-07-02 DIAGNOSIS — J309 Allergic rhinitis, unspecified: Secondary | ICD-10-CM | POA: Diagnosis not present

## 2023-07-02 DIAGNOSIS — Z2989 Encounter for other specified prophylactic measures: Secondary | ICD-10-CM | POA: Diagnosis not present

## 2023-07-02 NOTE — Progress Notes (Signed)
 Return pt here for allergy injections. Pt has epi pen for the appointment. No previous reactions to the injection were reported. Pt is well, no complaints. 3mm wheal noted after the 30 min wait period.

## 2023-07-09 ENCOUNTER — Ambulatory Visit: Payer: BC Managed Care – PPO

## 2023-07-09 DIAGNOSIS — J309 Allergic rhinitis, unspecified: Secondary | ICD-10-CM

## 2023-07-09 DIAGNOSIS — Z2989 Encounter for other specified prophylactic measures: Secondary | ICD-10-CM

## 2023-07-09 NOTE — Progress Notes (Signed)
 Return pt here for allergy injections. Pt has epi pen for the appointment. No previous reactions to the injection were reported. Pt is well, no complaints. 3mm wheal noted after the 30 min wait period.

## 2023-07-16 ENCOUNTER — Ambulatory Visit: Payer: BC Managed Care – PPO

## 2023-07-16 DIAGNOSIS — J309 Allergic rhinitis, unspecified: Secondary | ICD-10-CM

## 2023-07-16 DIAGNOSIS — Z2989 Encounter for other specified prophylactic measures: Secondary | ICD-10-CM | POA: Diagnosis not present

## 2023-07-16 NOTE — Progress Notes (Signed)
 Return pt here for allergy injections. Pt has epi pen for the appointment. No previous reactions to the injection were reported. Pt is well, no complaints. 3mm wheal noted after the 30 min wait period.

## 2023-07-27 DIAGNOSIS — K219 Gastro-esophageal reflux disease without esophagitis: Secondary | ICD-10-CM | POA: Diagnosis not present

## 2023-07-27 DIAGNOSIS — J301 Allergic rhinitis due to pollen: Secondary | ICD-10-CM | POA: Diagnosis not present

## 2023-07-30 ENCOUNTER — Ambulatory Visit: Payer: BC Managed Care – PPO

## 2023-07-30 DIAGNOSIS — J309 Allergic rhinitis, unspecified: Secondary | ICD-10-CM

## 2023-07-30 DIAGNOSIS — Z2989 Encounter for other specified prophylactic measures: Secondary | ICD-10-CM

## 2023-07-30 NOTE — Progress Notes (Signed)
 Return pt here for allergy injections. Pt has epi pen for the appointment. No previous reactions to the injection were reported. Pt is well, no complaints. 3mm wheal noted after the 30 min wait period.

## 2023-08-06 ENCOUNTER — Ambulatory Visit: Payer: BC Managed Care – PPO

## 2023-08-06 DIAGNOSIS — Z2989 Encounter for other specified prophylactic measures: Secondary | ICD-10-CM | POA: Diagnosis not present

## 2023-08-06 DIAGNOSIS — J309 Allergic rhinitis, unspecified: Secondary | ICD-10-CM | POA: Diagnosis not present

## 2023-08-06 NOTE — Progress Notes (Signed)
 Return pt here for allergy injections. Pt has epi pen for the appointment. No previous reactions to the injection were reported. Pt is well, no complaints. No reaction noted after the 30 min wait period.

## 2023-08-13 ENCOUNTER — Ambulatory Visit: Payer: BC Managed Care – PPO

## 2023-08-13 DIAGNOSIS — J309 Allergic rhinitis, unspecified: Secondary | ICD-10-CM

## 2023-08-13 DIAGNOSIS — Z2989 Encounter for other specified prophylactic measures: Secondary | ICD-10-CM | POA: Diagnosis not present

## 2023-08-13 NOTE — Progress Notes (Signed)
 Return pt here for allergy injections. Pt has epi pen for the appointment. No previous reactions to the injection were reported. Pt is well, no complaints. No reaction noted after the 30 min wait period.

## 2023-08-20 ENCOUNTER — Ambulatory Visit (INDEPENDENT_AMBULATORY_CARE_PROVIDER_SITE_OTHER): Payer: BC Managed Care – PPO

## 2023-08-20 DIAGNOSIS — Z2989 Encounter for other specified prophylactic measures: Secondary | ICD-10-CM | POA: Diagnosis not present

## 2023-08-20 DIAGNOSIS — J309 Allergic rhinitis, unspecified: Secondary | ICD-10-CM | POA: Diagnosis not present

## 2023-08-20 NOTE — Progress Notes (Signed)
 Return pt here for allergy injections. Pt has epi pen for the appointment. No previous reactions to the injection were reported. Pt is well, no complaints. No reaction noted after the 30 min wait period.

## 2023-08-27 ENCOUNTER — Ambulatory Visit: Payer: BC Managed Care – PPO

## 2023-08-27 DIAGNOSIS — J309 Allergic rhinitis, unspecified: Secondary | ICD-10-CM

## 2023-08-27 DIAGNOSIS — Z2989 Encounter for other specified prophylactic measures: Secondary | ICD-10-CM | POA: Diagnosis not present

## 2023-08-27 NOTE — Progress Notes (Signed)
 Return pt here for allergy injections. Pt has epi pen for the appointment. No previous reactions to the injection were reported. Pt is well, no complaints. No reaction noted after the 30 min wait period.

## 2023-09-03 ENCOUNTER — Ambulatory Visit: Payer: BC Managed Care – PPO

## 2023-09-03 DIAGNOSIS — Z2989 Encounter for other specified prophylactic measures: Secondary | ICD-10-CM

## 2023-09-03 DIAGNOSIS — J309 Allergic rhinitis, unspecified: Secondary | ICD-10-CM

## 2023-09-03 NOTE — Progress Notes (Signed)
 Return pt here for allergy injections. Pt has epi pen for the appointment. No previous reactions to the injection were reported. Pt is well, no complaints. No reaction noted after the 30 min wait period.

## 2023-09-04 DIAGNOSIS — J301 Allergic rhinitis due to pollen: Secondary | ICD-10-CM | POA: Diagnosis not present

## 2023-09-07 ENCOUNTER — Ambulatory Visit (INDEPENDENT_AMBULATORY_CARE_PROVIDER_SITE_OTHER)

## 2023-09-07 DIAGNOSIS — J309 Allergic rhinitis, unspecified: Secondary | ICD-10-CM | POA: Diagnosis not present

## 2023-09-07 DIAGNOSIS — Z2989 Encounter for other specified prophylactic measures: Secondary | ICD-10-CM | POA: Diagnosis not present

## 2023-09-07 NOTE — Progress Notes (Signed)
 Return pt here for allergy injections. Pt has epi pen for the appointment. No previous reactions to the injection were reported. Pt is well, no complaints. No reaction noted after the 30 min wait period.

## 2023-09-10 ENCOUNTER — Ambulatory Visit: Payer: BC Managed Care – PPO

## 2023-09-16 DIAGNOSIS — F331 Major depressive disorder, recurrent, moderate: Secondary | ICD-10-CM | POA: Diagnosis not present

## 2023-09-17 ENCOUNTER — Ambulatory Visit: Payer: BC Managed Care – PPO

## 2023-09-17 DIAGNOSIS — Z2989 Encounter for other specified prophylactic measures: Secondary | ICD-10-CM

## 2023-09-17 DIAGNOSIS — J309 Allergic rhinitis, unspecified: Secondary | ICD-10-CM | POA: Diagnosis not present

## 2023-09-17 NOTE — Progress Notes (Signed)
 Return pt here for allergy injections. Pt has epi pen for the appointment. No previous reactions to the injection were reported. Pt is well, no complaints. No reaction noted after the 30 min wait period.

## 2023-09-24 ENCOUNTER — Ambulatory Visit (INDEPENDENT_AMBULATORY_CARE_PROVIDER_SITE_OTHER): Payer: BC Managed Care – PPO

## 2023-09-24 DIAGNOSIS — Z2989 Encounter for other specified prophylactic measures: Secondary | ICD-10-CM | POA: Diagnosis not present

## 2023-09-24 DIAGNOSIS — J309 Allergic rhinitis, unspecified: Secondary | ICD-10-CM | POA: Diagnosis not present

## 2023-09-24 NOTE — Progress Notes (Signed)
 Return pt here for allergy injections. Pt has epi pen for the appointment. No previous reactions to the injection were reported. Pt is well, no complaints. No reaction noted after the 30 min wait period.

## 2023-09-25 DIAGNOSIS — Z1331 Encounter for screening for depression: Secondary | ICD-10-CM | POA: Diagnosis not present

## 2023-09-25 DIAGNOSIS — N920 Excessive and frequent menstruation with regular cycle: Secondary | ICD-10-CM | POA: Diagnosis not present

## 2023-09-25 DIAGNOSIS — Z1329 Encounter for screening for other suspected endocrine disorder: Secondary | ICD-10-CM | POA: Diagnosis not present

## 2023-09-25 DIAGNOSIS — E538 Deficiency of other specified B group vitamins: Secondary | ICD-10-CM | POA: Diagnosis not present

## 2023-09-25 DIAGNOSIS — Z131 Encounter for screening for diabetes mellitus: Secondary | ICD-10-CM | POA: Diagnosis not present

## 2023-09-25 DIAGNOSIS — Z113 Encounter for screening for infections with a predominantly sexual mode of transmission: Secondary | ICD-10-CM | POA: Diagnosis not present

## 2023-09-25 DIAGNOSIS — Z1151 Encounter for screening for human papillomavirus (HPV): Secondary | ICD-10-CM | POA: Diagnosis not present

## 2023-09-25 DIAGNOSIS — Z1339 Encounter for screening examination for other mental health and behavioral disorders: Secondary | ICD-10-CM | POA: Diagnosis not present

## 2023-09-25 DIAGNOSIS — Z01411 Encounter for gynecological examination (general) (routine) with abnormal findings: Secondary | ICD-10-CM | POA: Diagnosis not present

## 2023-09-25 DIAGNOSIS — E559 Vitamin D deficiency, unspecified: Secondary | ICD-10-CM | POA: Diagnosis not present

## 2023-09-25 DIAGNOSIS — R87610 Atypical squamous cells of undetermined significance on cytologic smear of cervix (ASC-US): Secondary | ICD-10-CM | POA: Diagnosis not present

## 2023-09-25 DIAGNOSIS — Z124 Encounter for screening for malignant neoplasm of cervix: Secondary | ICD-10-CM | POA: Diagnosis not present

## 2023-09-29 DIAGNOSIS — E538 Deficiency of other specified B group vitamins: Secondary | ICD-10-CM | POA: Insufficient documentation

## 2023-09-30 ENCOUNTER — Encounter: Payer: Self-pay | Admitting: Family Medicine

## 2023-09-30 ENCOUNTER — Ambulatory Visit: Payer: BC Managed Care – PPO | Admitting: Family Medicine

## 2023-09-30 VITALS — BP 106/74 | HR 78 | Resp 16 | Ht 62.0 in | Wt 167.5 lb

## 2023-09-30 DIAGNOSIS — F5104 Psychophysiologic insomnia: Secondary | ICD-10-CM

## 2023-09-30 DIAGNOSIS — E559 Vitamin D deficiency, unspecified: Secondary | ICD-10-CM | POA: Diagnosis not present

## 2023-09-30 DIAGNOSIS — F411 Generalized anxiety disorder: Secondary | ICD-10-CM | POA: Diagnosis not present

## 2023-09-30 DIAGNOSIS — F325 Major depressive disorder, single episode, in full remission: Secondary | ICD-10-CM | POA: Diagnosis not present

## 2023-09-30 DIAGNOSIS — Z9109 Other allergy status, other than to drugs and biological substances: Secondary | ICD-10-CM

## 2023-09-30 DIAGNOSIS — E538 Deficiency of other specified B group vitamins: Secondary | ICD-10-CM | POA: Diagnosis not present

## 2023-09-30 DIAGNOSIS — F338 Other recurrent depressive disorders: Secondary | ICD-10-CM

## 2023-09-30 DIAGNOSIS — D75839 Thrombocytosis, unspecified: Secondary | ICD-10-CM

## 2023-09-30 MED ORDER — QUETIAPINE FUMARATE 25 MG PO TABS: 25.0000 mg | ORAL_TABLET | Freq: Every day | ORAL | 2 refills | Status: DC

## 2023-09-30 MED ORDER — DULOXETINE HCL 30 MG PO CPEP: 90.0000 mg | ORAL_CAPSULE | Freq: Every day | ORAL | 0 refills | Status: DC

## 2023-09-30 MED ORDER — CYANOCOBALAMIN 1000 MCG/ML IJ SOLN
1000.0000 ug | Freq: Once | INTRAMUSCULAR | Status: AC
Start: 2023-09-30 — End: 2023-09-30
  Administered 2023-09-30: 1000 ug via INTRAMUSCULAR

## 2023-09-30 NOTE — Progress Notes (Signed)
 Name: Patricia Horn   MRN: [id number]    DOB: May 07, 1990   Date:09/30/2023       Progress Note  Subjective  Chief Complaint  Chief Complaint  Patient presents with   Medical Management of Chronic Issues   Discussed the use of AI scribe software for clinical note transcription with the patient, who gave verbal consent to proceed.  History of Present Illness Patricia Horn is a 33 year old female who presents for a follow-up visit for vitamin B12 deficiency.  She recently had a gynecological visit where blood work revealed a low vitamin B12 level, at 234 . She has not yet started any treatment for this deficiency. Her thyroid  function and A1c levels were normal. A basic metabolic panel done in January showed normal potassium levels after a previous emergency visit. A CBC showed a normal white count, but platelets was elevated  Her vitamin D level improved from 25 to 40, and she takes a multivitamin gummy but has not been taking additional vitamin D supplements.  Her depression is well-managed, and her anxiety is situational but currently stable. She takes duloxetine  90 mg daily and hydroxyzine  as needed for anxiety. She sees a therapist, Elana Grayer, on an as-needed basis, more frequently during stressful times like holidays. No crying spells or feeling down. She occasionally socializes with friends. She exercises regularly, attending training sessions twice a week, and finds it beneficial for her mood.  Her weight management is ongoing with Wegovy , currently at a dose of 1.7 mg. She has lost weight from 194 lbs to 167.8 lbs since starting Wegovy  in February last year. She has a history of weight gain during her late teens and early twenties, reaching 200 lbs.  She experiences environmental allergies and receives weekly allergy shots.   For sleep, she takes Tylenol  sleep aid nightly, which helps her fall asleep but not stay asleep. She has tried various sleep medications in the past, including  trazodone  and temazepam , but has not found them effective. She does not feel rested upon waking and has not undergone a sleep study.  She works at [employer] and prefers to walk on campus when students are not present. She recently moved into a house to have more space for her dog, transitioning from an apartment. She enjoys the peacefulness of her new environment.    Patient Active Problem List   Diagnosis Date Noted   Low serum vitamin B12 09/29/2023   MDD (major depressive disorder), recurrent episode, moderate (HCC) 10/22/2020   Environmental allergies 04/19/2020   Allergy to cats 04/19/2020   Uterine leiomyoma 03/02/2019   Bilateral hand pain 12/28/2017   Right carpal tunnel syndrome 11/10/2016   Urticaria 11/09/2015   Obesity (BMI 35.0-39.9 without comorbidity) 01/05/2015   Umbilical cyst 11/30/2014   Perennial allergic rhinitis 11/16/2014   GAD (generalized anxiety disorder) 10/06/2014   ANA positive 10/06/2014    Past Surgical History:  Procedure Laterality Date   HERNIA REPAIR  1995   umbilical    MYOMECTOMY  2015    Family History  Problem Relation Age of Onset   Hypertension Mother    Scleroderma Mother    Pulmonary fibrosis Mother    Asthma Mother    Cancer Neg Hx     Social History   Tobacco Use   Smoking status: Never   Smokeless tobacco: Never  Substance Use Topics   Alcohol use: Yes    Alcohol/week: 1.0 standard drink of alcohol    Types: 1 Standard drinks  or equivalent per week    Comment: ocassional     Current Outpatient Medications:    Cholecalciferol (VITAMIN D3) 50 MCG (2000 UT) CAPS, Take 1 capsule by mouth every morning., Disp: , Rfl:    DULoxetine  (CYMBALTA ) 30 MG capsule, Take 3 capsules (90 mg total) by mouth daily., Disp: 270 capsule, Rfl: 0   EPINEPHrine  0.3 mg/0.3 mL IJ SOAJ injection, Inject 0.3 mg into the muscle as needed for anaphylaxis., Disp: 1 each, Rfl: 5   hydrOXYzine  (ATARAX ) 10 MG tablet, Take 1-2 tablets (10-20 mg total) by  mouth daily., Disp: 90 tablet, Rfl: 0   levocetirizine (XYZAL ) 5 MG tablet, Take 1 tablet (5 mg total) by mouth every evening., Disp: 90 tablet, Rfl: 1   norethindrone-ethinyl estradiol-FE (LARIN FE 1/20) 1-20 MG-MCG tablet, Take 1 tablet by mouth daily. Skip placebo pills. Take continuously., Disp: 112 tablet, Rfl: 3   Olopatadine-Mometasone (RYALTRIS ) 665-25 MCG/ACT SUSP, Place 2 sprays into the nose 2 (two) times daily. (Patient taking differently: Place 2 sprays into the nose 2 (two) times daily as needed.), Disp: 29 g, Rfl: 1   omeprazole  (PRILOSEC) 40 MG capsule, Take 40 mg by mouth daily., Disp: , Rfl:    Semaglutide -Weight Management (WEGOVY ) 1.7 MG/0.75ML SOAJ, Inject 1.7 mg into the skin once a week., Disp: 9 mL, Rfl: 0  Allergies  Allergen Reactions   Peanut Oil Swelling    Eyelids swell.   Peanut-Containing Drug Products     I personally reviewed active problem list, medication list, allergies, family history with the patient/caregiver today.   ROS  Ten systems reviewed and is negative except as mentioned in HPI    Objective Physical Exam Constitutional: Patient appears well-developed and well-nourished. Obese  No distress.  HEENT: head atraumatic, normocephalic, pupils equal and reactive to light, neck supple, Cardiovascular: Normal rate, regular rhythm and normal heart sounds.  No murmur heard. No BLE edema. Pulmonary/Chest: Effort normal and breath sounds normal. No respiratory distress. Abdominal: Soft.  There is no tenderness. Psychiatric: Patient has a normal mood and affect. behavior is normal. Judgment and thought content normal.    Vitals:   09/30/23 0851  BP: 106/74  Pulse: 78  Resp: 16  SpO2: 95%  Weight: 167 lb 8 oz (76 kg)  Height: 5\' 2"  (1.575 m)    Body mass index is 30.64 kg/m.   PHQ2/9:    09/30/2023    8:47 AM 07/01/2023    8:40 AM 05/08/2023    7:33 AM 03/26/2023   11:47 AM 12/25/2022   10:56 AM  Depression screen PHQ 2/9  Decreased  Interest 0 0 0 0 0  Down, Depressed, Hopeless 0 0 0 0 0  PHQ - 2 Score 0 0 0 0 0  Altered sleeping 0 0 0 3 0  Tired, decreased energy 0 0 0 3 0  Change in appetite 0 0 0 2 0  Feeling bad or failure about yourself  0 0 0 0 0  Trouble concentrating 0 0 0 0 0  Moving slowly or fidgety/restless 0 0 0 0 0  Suicidal thoughts 0 0 0 0 0  PHQ-9 Score 0 0 0 8 0  Difficult doing work/chores Not difficult at all Not difficult at all Not difficult at all Somewhat difficult     phq 9 is negative  Fall Risk:    09/30/2023    8:47 AM 05/08/2023    7:33 AM 03/26/2023   11:47 AM 12/25/2022   10:56 AM 10/31/2022  3:33 PM  Fall Risk   Falls in the past year? 0 0 0 0 0  Number falls in past yr: 0 0   0  Injury with Fall? 0 0   0  Risk for fall due to : No Fall Risks No Fall Risks No Fall Risks No Fall Risks No Fall Risks  Follow up Falls prevention discussed;Education provided;Falls evaluation completed Falls prevention discussed;Education provided;Falls evaluation completed Falls prevention discussed Falls prevention discussed Falls prevention discussed     Assessment & Plan Vitamin B12 deficiency Vitamin B12 deficiency due to dietary insufficiency, injections preferred for absorption issues. - Administer B12 injection today. - Schedule monthly B12 injections for two months. - Re-evaluate B12 levels in three months.  Obesity Obesity with BMI 30.64, slight weight increase post-illness. Significant weight loss on Wegovy  1.7 mg. Discussed dose increase to 2.4 mg if tolerated. - Continue Wegovy  1.7 mg until current supply is finished. - Consider increasing Wegovy  to 2.4 mg if tolerated. - Encourage continued exercise regimen.  Insomnia Chronic insomnia with inadequate response to current sleep aids. Proposed quetiapine 25 mg trial for sleep. - Prescribe quetiapine 25 mg for sleep, trial for 30 days. - Evaluate effectiveness of quetiapine for sleep.  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 Continues duloxetine  and therapy, regular exercise aids mood stability. - Continue duloxetine  90 mg daily. - Continue therapy sessions as needed. - Encourage continued exercise regimen.  Thrombocytosis Thrombocytosis with previously elevated WBC now normalized. Monitoring planned. - Re-evaluate CBC in three months during follow-up visit.  Environmental allergies Environmental allergies managed with weekly allergy shots. - Continue weekly allergy shots.

## 2023-10-06 ENCOUNTER — Ambulatory Visit

## 2023-10-06 DIAGNOSIS — Z2989 Encounter for other specified prophylactic measures: Secondary | ICD-10-CM | POA: Diagnosis not present

## 2023-10-06 DIAGNOSIS — J309 Allergic rhinitis, unspecified: Secondary | ICD-10-CM

## 2023-10-06 NOTE — Progress Notes (Signed)
 Return pt here for allergy injections. Pt has epi pen for the appointment. No previous reactions to the injection were reported. Pt is well, no complaints. No reaction noted after the 30 min wait period.

## 2023-10-13 ENCOUNTER — Ambulatory Visit (INDEPENDENT_AMBULATORY_CARE_PROVIDER_SITE_OTHER): Payer: Self-pay | Admitting: Adult Health

## 2023-10-13 ENCOUNTER — Other Ambulatory Visit: Payer: Self-pay

## 2023-10-13 ENCOUNTER — Encounter: Payer: Self-pay | Admitting: Adult Health

## 2023-10-13 ENCOUNTER — Ambulatory Visit

## 2023-10-13 VITALS — BP 92/64 | HR 112 | Temp 98.1°F

## 2023-10-13 DIAGNOSIS — J309 Allergic rhinitis, unspecified: Secondary | ICD-10-CM

## 2023-10-13 DIAGNOSIS — Z2989 Encounter for other specified prophylactic measures: Secondary | ICD-10-CM | POA: Diagnosis not present

## 2023-10-13 DIAGNOSIS — R300 Dysuria: Secondary | ICD-10-CM

## 2023-10-13 DIAGNOSIS — R5383 Other fatigue: Secondary | ICD-10-CM

## 2023-10-13 LAB — POCT URINALYSIS DIPSTICK (MANUAL)
Leukocytes, UA: NEGATIVE
Nitrite, UA: NEGATIVE
Poct Bilirubin: NEGATIVE
Poct Glucose: NORMAL mg/dL
Poct Urobilinogen: NORMAL mg/dL
Spec Grav, UA: 1.025 (ref 1.010–1.025)
pH, UA: 6.5 (ref 5.0–8.0)

## 2023-10-13 NOTE — Progress Notes (Signed)
 [facility] [address] [place], [place] [address]   Office Visit Note  Patient Name: Patricia Horn Date of Birth [birth year]  Medical Record number [id number]  Date of Service: 10/13/2023  Chief Complaint  Patient presents with   Acute Visit    UTI symptoms- super tired, dark urine, no painful urination, no burning, states that she has had UTI before with no symptoms     HPI Pt is here for a sick visit. She reports she started having some fatigue and found out her B12 is low. She has a history of UTI's with atypical symptoms.  She reports dark urine, but denies any significant dysuria, flank pain, hesitancy or frequency.    Current Medication:  Outpatient Encounter Medications as of 10/13/2023  Medication Sig   Cholecalciferol (VITAMIN D3) 50 MCG (2000 UT) CAPS Take 1 capsule by mouth every morning.   DULoxetine  (CYMBALTA ) 30 MG capsule Take 3 capsules (90 mg total) by mouth daily.   EPINEPHrine  0.3 mg/0.3 mL IJ SOAJ injection Inject 0.3 mg into the muscle as needed for anaphylaxis.   hydrOXYzine  (ATARAX ) 10 MG tablet Take 1-2 tablets (10-20 mg total) by mouth daily.   levocetirizine (XYZAL ) 5 MG tablet Take 1 tablet (5 mg total) by mouth every evening.   norethindrone-ethinyl estradiol-FE (LARIN FE 1/20) 1-20 MG-MCG tablet Take 1 tablet by mouth daily. Skip placebo pills. Take continuously.   Olopatadine-Mometasone (RYALTRIS ) 665-25 MCG/ACT SUSP Place 2 sprays into the nose 2 (two) times daily. (Patient taking differently: Place 2 sprays into the nose 2 (two) times daily as needed.)   omeprazole  (PRILOSEC) 40 MG capsule Take 40 mg by mouth daily.   QUEtiapine  (SEROQUEL ) 25 MG tablet Take 1 tablet (25 mg total) by mouth at bedtime.   Semaglutide -Weight Management (WEGOVY ) 1.7 MG/0.75ML SOAJ Inject 1.7 mg into the skin once a week.   No facility-administered encounter medications on file as of 10/13/2023.      Medical History: Past Medical History:  Diagnosis Date    Anxiety    Depression    Environmental allergies    Fibroid uterus    Tendinitis    Umbilical hernia    Uterine leiomyoma 03/02/2019     Vital Signs: BP 92/64   Pulse (!) 112   Temp 98.1 F (36.7 C) (Tympanic)   SpO2 97%    Review of Systems  Constitutional:  Positive for fever.  Genitourinary:  Positive for dysuria.    Physical Exam Vitals reviewed.  Constitutional:      Appearance: Normal appearance.  HENT:     Head: Normocephalic.  Pulmonary:     Effort: Pulmonary effort is normal.  Abdominal:     General: There is no distension.  Neurological:     Mental Status: She is alert.     Assessment/Plan: 1. Other fatigue (Primary) Continue previous B12 regiment.   2. Dysuria Results for orders placed or performed in visit on 10/13/23 (from the past 24 hours)  POCT Urinalysis Dip Manual     Status: Abnormal   Collection Time: 10/13/23  1:52 PM  Result Value Ref Range   Spec Grav, UA 1.025 1.010 - 1.025   pH, UA 6.5 5.0 - 8.0   Leukocytes, UA Negative Negative   Nitrite, UA Negative Negative   Poct Protein trace Negative, trace mg/dL   Poct Glucose Normal Normal mg/dL   Poct Ketones ++ moderate (A) Negative   Poct Urobilinogen Normal Normal mg/dL   Poct Bilirubin Negative Negative  Poct Blood trace Negative, trace    - POCT Urinalysis Dip Manual     General Counseling: [name] verbalizes understanding of the findings of todays visit and agrees with plan of treatment. I have discussed any further diagnostic evaluation that may be needed or ordered today. We also reviewed her medications today. she has been encouraged to call the office with any questions or concerns that should arise related to todays visit.   Orders Placed This Encounter  Procedures   POCT Urinalysis Dip Manual    No orders of the defined types were placed in this encounter.   Time spent:15 Minutes    Sheria Dills AGNP-C Nurse Practitioner

## 2023-10-13 NOTE — Progress Notes (Signed)
 Return pt here for allergy injections. Pt has epi pen for the appointment. No previous reactions to the injection were reported. Pt is well, no complaints. No reaction noted after the 30 min wait period.

## 2023-10-15 DIAGNOSIS — B9689 Other specified bacterial agents as the cause of diseases classified elsewhere: Secondary | ICD-10-CM | POA: Diagnosis not present

## 2023-10-15 DIAGNOSIS — R3129 Other microscopic hematuria: Secondary | ICD-10-CM | POA: Diagnosis not present

## 2023-10-15 DIAGNOSIS — N760 Acute vaginitis: Secondary | ICD-10-CM | POA: Diagnosis not present

## 2023-10-20 ENCOUNTER — Ambulatory Visit

## 2023-10-20 DIAGNOSIS — Z2989 Encounter for other specified prophylactic measures: Secondary | ICD-10-CM | POA: Diagnosis not present

## 2023-10-20 DIAGNOSIS — J309 Allergic rhinitis, unspecified: Secondary | ICD-10-CM | POA: Diagnosis not present

## 2023-10-20 NOTE — Progress Notes (Signed)
 Return pt here for allergy injections. Pt has epi pen for the appointment. No previous reactions to the injection were reported. Pt is well, no complaints. No reaction noted after the 30 min wait period.

## 2023-10-22 DIAGNOSIS — R87611 Atypical squamous cells cannot exclude high grade squamous intraepithelial lesion on cytologic smear of cervix (ASC-H): Secondary | ICD-10-CM | POA: Diagnosis not present

## 2023-10-22 DIAGNOSIS — N871 Moderate cervical dysplasia: Secondary | ICD-10-CM | POA: Diagnosis not present

## 2023-10-22 DIAGNOSIS — D069 Carcinoma in situ of cervix, unspecified: Secondary | ICD-10-CM | POA: Diagnosis not present

## 2023-10-27 ENCOUNTER — Ambulatory Visit (INDEPENDENT_AMBULATORY_CARE_PROVIDER_SITE_OTHER)

## 2023-10-27 DIAGNOSIS — Z2989 Encounter for other specified prophylactic measures: Secondary | ICD-10-CM

## 2023-10-27 DIAGNOSIS — J309 Allergic rhinitis, unspecified: Secondary | ICD-10-CM

## 2023-10-27 NOTE — Progress Notes (Signed)
 Return pt here for allergy injections. Pt has epi pen for the appointment. No previous reactions to the injection were reported. Pt is well, no complaints. No reaction noted after the 30 min wait period.

## 2023-11-02 ENCOUNTER — Encounter: Payer: Self-pay | Admitting: Family Medicine

## 2023-11-03 ENCOUNTER — Ambulatory Visit (INDEPENDENT_AMBULATORY_CARE_PROVIDER_SITE_OTHER)

## 2023-11-03 DIAGNOSIS — E538 Deficiency of other specified B group vitamins: Secondary | ICD-10-CM | POA: Diagnosis not present

## 2023-11-03 DIAGNOSIS — Z2989 Encounter for other specified prophylactic measures: Secondary | ICD-10-CM

## 2023-11-03 MED ORDER — CYANOCOBALAMIN 1000 MCG/ML IJ SOLN
1000.0000 ug | Freq: Once | INTRAMUSCULAR | Status: AC
  Administered 2023-11-03: 1000 ug via INTRAMUSCULAR

## 2023-11-03 NOTE — Progress Notes (Signed)
 Return pt here for allergy injections. Pt has epi pen for the appointment. No previous reactions to the injection were reported. Pt is well, no complaints. No reaction noted after the 30 min wait period.

## 2023-11-03 NOTE — Progress Notes (Signed)
 Patient is in office today for a nurse visit for B12 Injection. Patient Injection was given in the  Right deltoid. Patient tolerated injection well.

## 2023-11-05 DIAGNOSIS — D069 Carcinoma in situ of cervix, unspecified: Secondary | ICD-10-CM | POA: Diagnosis not present

## 2023-11-05 DIAGNOSIS — Z01812 Encounter for preprocedural laboratory examination: Secondary | ICD-10-CM | POA: Diagnosis not present

## 2023-11-10 ENCOUNTER — Ambulatory Visit (INDEPENDENT_AMBULATORY_CARE_PROVIDER_SITE_OTHER)

## 2023-11-10 DIAGNOSIS — Z2989 Encounter for other specified prophylactic measures: Secondary | ICD-10-CM

## 2023-11-10 DIAGNOSIS — J309 Allergic rhinitis, unspecified: Secondary | ICD-10-CM

## 2023-11-10 NOTE — Progress Notes (Signed)
 Return pt here for allergy injections. Pt has epi pen for the appointment. No previous reactions to the injection were reported. Pt is well, no complaints. No reaction noted after the 30 min wait period.

## 2023-11-17 ENCOUNTER — Ambulatory Visit (INDEPENDENT_AMBULATORY_CARE_PROVIDER_SITE_OTHER)

## 2023-11-17 DIAGNOSIS — J309 Allergic rhinitis, unspecified: Secondary | ICD-10-CM | POA: Diagnosis not present

## 2023-11-17 DIAGNOSIS — Z2989 Encounter for other specified prophylactic measures: Secondary | ICD-10-CM | POA: Diagnosis not present

## 2023-11-17 NOTE — Progress Notes (Signed)
 Return pt here for allergy injections. Pt has epi pen for the appointment. No previous reactions to the injection were reported. Pt is well, no complaints. No reaction noted after the 30 min wait period.

## 2023-11-24 ENCOUNTER — Ambulatory Visit (INDEPENDENT_AMBULATORY_CARE_PROVIDER_SITE_OTHER)

## 2023-11-24 DIAGNOSIS — Z2989 Encounter for other specified prophylactic measures: Secondary | ICD-10-CM | POA: Diagnosis not present

## 2023-11-24 DIAGNOSIS — J309 Allergic rhinitis, unspecified: Secondary | ICD-10-CM

## 2023-11-24 NOTE — Progress Notes (Signed)
 Return pt here for allergy injections. Pt has epi pen for the appointment. No previous reactions to the injection were reported. Pt is well, no complaints. No reaction noted after the 30 min wait period.

## 2023-12-01 ENCOUNTER — Ambulatory Visit

## 2023-12-01 DIAGNOSIS — R102 Pelvic and perineal pain: Secondary | ICD-10-CM | POA: Diagnosis not present

## 2023-12-01 DIAGNOSIS — N898 Other specified noninflammatory disorders of vagina: Secondary | ICD-10-CM | POA: Diagnosis not present

## 2023-12-01 DIAGNOSIS — B3731 Acute candidiasis of vulva and vagina: Secondary | ICD-10-CM | POA: Diagnosis not present

## 2023-12-04 ENCOUNTER — Ambulatory Visit

## 2023-12-04 DIAGNOSIS — J309 Allergic rhinitis, unspecified: Secondary | ICD-10-CM | POA: Diagnosis not present

## 2023-12-04 DIAGNOSIS — Z2989 Encounter for other specified prophylactic measures: Secondary | ICD-10-CM | POA: Diagnosis not present

## 2023-12-04 NOTE — Progress Notes (Signed)
 Return pt here for allergy injections. Pt has epi pen for the appointment. No previous reactions to the injection were reported. Pt is well, no complaints. No reaction noted after the 30 min wait period.

## 2023-12-06 ENCOUNTER — Encounter: Payer: Self-pay | Admitting: Family Medicine

## 2023-12-08 ENCOUNTER — Other Ambulatory Visit: Payer: Self-pay | Admitting: Family Medicine

## 2023-12-08 DIAGNOSIS — E66811 Obesity, class 1: Secondary | ICD-10-CM

## 2023-12-09 ENCOUNTER — Encounter: Payer: Self-pay | Admitting: Family Medicine

## 2023-12-09 DIAGNOSIS — J301 Allergic rhinitis due to pollen: Secondary | ICD-10-CM | POA: Diagnosis not present

## 2023-12-09 MED ORDER — WEGOVY 2.4 MG/0.75ML ~~LOC~~ SOAJ: 2.4000 mg | SUBCUTANEOUS | 0 refills | Status: DC

## 2023-12-10 ENCOUNTER — Ambulatory Visit (INDEPENDENT_AMBULATORY_CARE_PROVIDER_SITE_OTHER)

## 2023-12-10 DIAGNOSIS — Z2989 Encounter for other specified prophylactic measures: Secondary | ICD-10-CM

## 2023-12-10 DIAGNOSIS — J309 Allergic rhinitis, unspecified: Secondary | ICD-10-CM

## 2023-12-10 NOTE — Progress Notes (Signed)
 Return pt here for allergy injections. Pt has epi pen for the appointment. No previous reactions to the injection were reported. Pt brought in new vial today, started at lower dose to build up. Pt is well, no complaints. No reaction noted after the 30 min wait period.

## 2023-12-17 ENCOUNTER — Ambulatory Visit (INDEPENDENT_AMBULATORY_CARE_PROVIDER_SITE_OTHER)

## 2023-12-17 DIAGNOSIS — J309 Allergic rhinitis, unspecified: Secondary | ICD-10-CM | POA: Diagnosis not present

## 2023-12-17 DIAGNOSIS — Z2989 Encounter for other specified prophylactic measures: Secondary | ICD-10-CM

## 2023-12-17 NOTE — Progress Notes (Signed)
 Return pt here for allergy injections. Pt has epi pen for the appointment. No previous reactions to the injection were reported. Pt is well, no complaints. 2mm wheal noted after the 30 min wait period.

## 2023-12-24 ENCOUNTER — Ambulatory Visit

## 2023-12-24 DIAGNOSIS — Z2989 Encounter for other specified prophylactic measures: Secondary | ICD-10-CM

## 2023-12-24 DIAGNOSIS — J309 Allergic rhinitis, unspecified: Secondary | ICD-10-CM | POA: Diagnosis not present

## 2023-12-24 NOTE — Progress Notes (Signed)
 Return pt here for allergy injections. Pt has epi pen for the appointment. No previous reactions to the injection were reported. Pt is well, no complaints. No reaction noted after the 30 min wait period.

## 2023-12-31 ENCOUNTER — Encounter: Payer: Self-pay | Admitting: Family Medicine

## 2023-12-31 ENCOUNTER — Ambulatory Visit (INDEPENDENT_AMBULATORY_CARE_PROVIDER_SITE_OTHER): Admitting: Family Medicine

## 2023-12-31 ENCOUNTER — Ambulatory Visit

## 2023-12-31 VITALS — BP 108/74 | HR 97 | Resp 16 | Ht 62.0 in | Wt 161.7 lb

## 2023-12-31 DIAGNOSIS — E559 Vitamin D deficiency, unspecified: Secondary | ICD-10-CM | POA: Diagnosis not present

## 2023-12-31 DIAGNOSIS — Z13220 Encounter for screening for lipoid disorders: Secondary | ICD-10-CM | POA: Diagnosis not present

## 2023-12-31 DIAGNOSIS — D72829 Elevated white blood cell count, unspecified: Secondary | ICD-10-CM

## 2023-12-31 DIAGNOSIS — Z2989 Encounter for other specified prophylactic measures: Secondary | ICD-10-CM | POA: Diagnosis not present

## 2023-12-31 DIAGNOSIS — J309 Allergic rhinitis, unspecified: Secondary | ICD-10-CM | POA: Diagnosis not present

## 2023-12-31 DIAGNOSIS — Z8639 Personal history of other endocrine, nutritional and metabolic disease: Secondary | ICD-10-CM | POA: Diagnosis not present

## 2023-12-31 DIAGNOSIS — Z1159 Encounter for screening for other viral diseases: Secondary | ICD-10-CM

## 2023-12-31 DIAGNOSIS — F338 Other recurrent depressive disorders: Secondary | ICD-10-CM

## 2023-12-31 DIAGNOSIS — K219 Gastro-esophageal reflux disease without esophagitis: Secondary | ICD-10-CM

## 2023-12-31 DIAGNOSIS — F325 Major depressive disorder, single episode, in full remission: Secondary | ICD-10-CM | POA: Diagnosis not present

## 2023-12-31 DIAGNOSIS — E538 Deficiency of other specified B group vitamins: Secondary | ICD-10-CM

## 2023-12-31 DIAGNOSIS — Z79899 Other long term (current) drug therapy: Secondary | ICD-10-CM

## 2023-12-31 DIAGNOSIS — F411 Generalized anxiety disorder: Secondary | ICD-10-CM

## 2023-12-31 LAB — LIPID PANEL
Cholesterol: 140 mg/dL (ref <200)
HDL: 57 mg/dL (ref >=50)
LDL Cholesterol (Calc): 65 mg/dL
Non-HDL Cholesterol (Calc): 83 mg/dL (ref <130)
Total CHOL/HDL Ratio: 2.5 (calc) (ref <5.0)
Triglycerides: 96 mg/dL (ref <150)

## 2023-12-31 LAB — CBC WITH DIFFERENTIAL/PLATELET
Absolute Lymphocytes: 2248 {cells}/uL (ref 850–3900)
Absolute Monocytes: 299 {cells}/uL (ref 200–950)
Basophils Absolute: 22 {cells}/uL (ref 0–200)
Basophils Relative: 0.3 %
Eosinophils Absolute: 58 {cells}/uL (ref 15–500)
Eosinophils Relative: 0.8 %
HCT: 36.5 % (ref 35.0–45.0)
Hemoglobin: 12 g/dL (ref 11.7–15.5)
MCH: 30 pg (ref 27.0–33.0)
MCHC: 32.9 g/dL (ref 32.0–36.0)
MCV: 91.3 fL (ref 80.0–100.0)
MPV: 9.1 fL (ref 7.5–12.5)
Monocytes Relative: 4.1 %
Neutro Abs: 4672 {cells}/uL (ref 1500–7800)
Neutrophils Relative %: 64 %
Platelets: 417 Thousand/uL — ABNORMAL HIGH (ref 140–400)
RBC: 4 Million/uL (ref 3.80–5.10)
RDW: 15.1 % — ABNORMAL HIGH (ref 11.0–15.0)
Total Lymphocyte: 30.8 %
WBC: 7.3 Thousand/uL (ref 3.8–10.8)

## 2023-12-31 LAB — COMPREHENSIVE METABOLIC PANEL WITH GFR
AG Ratio: 1.4 (calc) (ref 1.0–2.5)
ALT: 14 U/L (ref 6–29)
AST: 13 U/L (ref 10–30)
Albumin: 4 g/dL (ref 3.6–5.1)
Alkaline phosphatase (APISO): 65 U/L (ref 31–125)
BUN: 12 mg/dL (ref 7–25)
CO2: 25 mmol/L (ref 20–32)
Calcium: 9.5 mg/dL (ref 8.6–10.2)
Chloride: 105 mmol/L (ref 98–110)
Creat: 0.93 mg/dL (ref 0.50–0.97)
Globulin: 2.9 g/dL (ref 1.9–3.7)
Glucose, Bld: 99 mg/dL (ref 65–99)
Potassium: 4.6 mmol/L (ref 3.5–5.3)
Sodium: 138 mmol/L (ref 135–146)
Total Bilirubin: 0.3 mg/dL (ref 0.2–1.2)
Total Protein: 6.9 g/dL (ref 6.1–8.1)
eGFR: 82 mL/min/1.73m2 (ref >=60)

## 2023-12-31 LAB — VITAMIN D 25 HYDROXY (VIT D DEFICIENCY, FRACTURES): Vit D, 25-Hydroxy: 35 ng/mL (ref 30–100)

## 2023-12-31 LAB — B12 AND FOLATE PANEL
Folate: >24 ng/mL
Vitamin B-12: 458 pg/mL (ref 200–1100)

## 2023-12-31 MED ORDER — DULOXETINE HCL 30 MG PO CPEP
90.0000 mg | ORAL_CAPSULE | Freq: Every day | ORAL | 0 refills | Status: DC
Start: 2023-12-31 — End: 2024-03-30

## 2023-12-31 MED ORDER — WEGOVY 2.4 MG/0.75ML ~~LOC~~ SOAJ
2.4000 mg | SUBCUTANEOUS | 0 refills | Status: DC
Start: 2023-12-31 — End: 2024-03-30

## 2023-12-31 MED ORDER — BUPROPION HCL ER (XL) 150 MG PO TB24
150.0000 mg | ORAL_TABLET | ORAL | 0 refills | Status: DC
Start: 2023-12-31 — End: 2024-03-29

## 2023-12-31 NOTE — Progress Notes (Signed)
 Name: Patricia Horn   MRN: [id number]    DOB: November 06, 1990   Date:12/31/2023       Progress Note  Subjective  Chief Complaint  Chief Complaint  Patient presents with   Medical Management of Chronic Issues   Discussed the use of AI scribe software for clinical note transcription with the patient, who gave verbal consent to proceed.  History of Present Illness Patricia Horn is a 33 year old female who presents for weight management and depression.  She has been on a weight loss journey, initially starting with Ozempic  in November 2023, and later transitioning to Wegovy . She experienced a significant weight reduction from 194 lbs in August 2023 to 161 lbs currently. She aims to reach a goal weight of 155 lbs. Her weight loss regimen includes attending training classes twice a week and dietary modifications, although she reports a plateau in her weight loss.  Her current diet includes a breakfast of a Clif Bar and V8 energy drink, with occasional protein shakes. Lunch typically consists of Lean Cuisine microwave meals due to time constraints, and dinner varies, sometimes repeating the lunch meal. She has reduced her intake of milk and salads due to cost and convenience. She occasionally consumes rotisserie chicken and vegetables. She acknowledges a need to increase protein intake and is considering logging her food intake to better manage her diet.  She experiences depression, describing it as feeling 'bored with life.' She is currently taking duloxetine  90 mg  for major depression. She notes a seasonal pattern to her depressive symptoms, which tend to worsen in the fall and winter months. She has a history of using Seroquel  for sleep, but currently manages her sleep with Tylenol  PM. She reports variable sleep quality, sometimes disrupted, and has not found previous sleep medications effective.  She has a history of B12 deficiency, previously managed with injections, and is now taking sublingual  supplements. She also reports occasional reflux symptoms, managed with medication as needed. Her vitamin D  levels were low in the past, and she has recently started a vitamin pack from [facility] for women over thirty.  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 She is scheduled for follow-up in November.    Patient Active Problem List   Diagnosis Date Noted   Low serum vitamin B12 09/29/2023   MDD (major depressive disorder), recurrent episode, moderate (HCC) 10/22/2020   Environmental allergies 04/19/2020   Allergy to cats 04/19/2020   Uterine leiomyoma 03/02/2019   Bilateral hand pain 12/28/2017   Right carpal tunnel syndrome 11/10/2016   Urticaria 11/09/2015   Obesity (BMI 35.0-39.9 without comorbidity) 01/05/2015   Umbilical cyst 11/30/2014   Perennial allergic rhinitis 11/16/2014   GAD (generalized anxiety disorder) 10/06/2014   ANA positive 10/06/2014    Past Surgical History:  Procedure Laterality Date   HERNIA REPAIR  1995   umbilical    MYOMECTOMY  2015    Family History  Problem Relation Age of Onset   Hypertension Mother    Scleroderma Mother    Pulmonary fibrosis Mother    Asthma Mother    Cancer Mother     Social History   Tobacco Use   Smoking status: Never   Smokeless tobacco: Never  Substance Use Topics   Alcohol use: Yes    Alcohol/week: 1.0 standard drink of alcohol    Types: 1 Standard drinks or equivalent per week    Comment: ocassional     Current Outpatient Medications:  Cholecalciferol (VITAMIN D3) 50 MCG (2000 UT) CAPS, Take 1 capsule by mouth every morning., Disp: , Rfl:    DULoxetine  (CYMBALTA ) 30 MG capsule, Take 3 capsules (90 mg total) by mouth daily., Disp: 270 capsule, Rfl: 0   EPINEPHrine  0.3 mg/0.3 mL IJ SOAJ injection, Inject 0.3 mg into the muscle as needed for anaphylaxis., Disp: 1 each, Rfl: 5   hydrOXYzine  (ATARAX ) 10 MG tablet, Take 1-2 tablets (10-20 mg total) by mouth daily., Disp: 90  tablet, Rfl: 0   levocetirizine (XYZAL ) 5 MG tablet, Take 1 tablet (5 mg total) by mouth every evening., Disp: 90 tablet, Rfl: 1   norethindrone-ethinyl estradiol-FE (LARIN FE 1/20) 1-20 MG-MCG tablet, Take 1 tablet by mouth daily. Skip placebo pills. Take continuously., Disp: 112 tablet, Rfl: 3   omeprazole  (PRILOSEC) 40 MG capsule, Take 40 mg by mouth daily., Disp: , Rfl:    semaglutide -weight management (WEGOVY ) 2.4 MG/0.75ML SOAJ SQ injection, Inject 2.4 mg into the skin once a week., Disp: 9 mL, Rfl: 0   Olopatadine-Mometasone (RYALTRIS ) 665-25 MCG/ACT SUSP, Place 2 sprays into the nose 2 (two) times daily. (Patient not taking: Reported on 12/31/2023), Disp: 29 g, Rfl: 1   QUEtiapine  (SEROQUEL ) 25 MG tablet, Take 1 tablet (25 mg total) by mouth at bedtime. (Patient not taking: Reported on 12/31/2023), Disp: 30 tablet, Rfl: 2   Semaglutide -Weight Management (WEGOVY ) 1.7 MG/0.75ML SOAJ, Inject 1.7 mg into the skin once a week. (Patient not taking: Reported on 12/31/2023), Disp: 9 mL, Rfl: 0  Allergies  Allergen Reactions   Peanut Oil Swelling    Eyelids swell.   Peanut-Containing Drug Products     I personally reviewed active problem list, medication list, allergies, family history with the patient/caregiver today.   ROS  Ten systems reviewed and is negative except as mentioned in HPI    Objective Physical Exam  CONSTITUTIONAL: Patient appears well-developed and well-nourished.  No distress. HEENT: Head atraumatic, normocephalic, neck supple. CARDIOVASCULAR: Normal rate, regular rhythm and normal heart sounds.  No murmur heard. No BLE edema. PULMONARY: Effort normal and breath sounds normal. No respiratory distress. MUSCULOSKELETAL: Normal gait. Without gross motor or sensory deficit. PSYCHIATRIC: Patient has a normal mood and affect. behavior is normal. Judgment and thought content normal.  Vitals:   12/31/23 0943  BP: 108/74  Pulse: 97  Resp: 16  SpO2: 100%  Weight: 161 lb  11.2 oz (73.3 kg)  Height: 5' 2 (1.575 m)    Body mass index is 29.58 kg/m.  Recent Results (from the past 2160 hours)  POCT Urinalysis Dip Manual     Status: Abnormal   Collection Time: 10/13/23  1:52 PM  Result Value Ref Range   Spec Grav, UA 1.025 1.010 - 1.025   pH, UA 6.5 5.0 - 8.0   Leukocytes, UA Negative Negative   Nitrite, UA Negative Negative   Poct Protein trace Negative, trace mg/dL   Poct Glucose Normal Normal mg/dL   Poct Ketones ++ moderate (A) Negative   Poct Urobilinogen Normal Normal mg/dL   Poct Bilirubin Negative Negative   Poct Blood trace Negative, trace     PHQ2/9:    12/31/2023    9:40 AM 09/30/2023    8:47 AM 07/01/2023    8:40 AM 05/08/2023    7:33 AM 03/26/2023   11:47 AM  Depression screen PHQ 2/9  Decreased Interest 1 0 0 0 0  Down, Depressed, Hopeless 1 0 0 0 0  PHQ - 2 Score 2 0 0 0  0  Altered sleeping 0 0 0 0 3  Tired, decreased energy 0 0 0 0 3  Change in appetite 0 0 0 0 2  Feeling bad or failure about yourself  0 0 0 0 0  Trouble concentrating 0 0 0 0 0  Moving slowly or fidgety/restless 0 0 0 0 0  Suicidal thoughts 0 0 0 0 0  PHQ-9 Score 2 0 0 0 8  Difficult doing work/chores Somewhat difficult Not difficult at all Not difficult at all Not difficult at all Somewhat difficult    phq 9 is positive  Fall Risk:    12/31/2023    9:38 AM 09/30/2023    8:47 AM 05/08/2023    7:33 AM 03/26/2023   11:47 AM 12/25/2022   10:56 AM  Fall Risk   Falls in the past year? 0 0 0 0 0  Number falls in past yr: 0 0 0    Injury with Fall? 0 0 0    Risk for fall due to : No Fall Risks No Fall Risks No Fall Risks No Fall Risks No Fall Risks  Follow up Falls evaluation completed Falls prevention discussed;Education provided;Falls evaluation completed Falls prevention discussed;Education provided;Falls evaluation completed Falls prevention discussed Falls prevention discussed     Assessment & Plan Overweight now but previous BMI over 30  Weight  decreased from 194 lbs to 161 lbs, BMI now 29.58. Experiencing weight loss plateau. - Continue Wegovy  2.4 mg. - Log food intake for 1-2 weeks to assess caloric and protein intake. - Increase protein intake, consider protein shakes for breakfast. - Incorporate more real food, reduce processed meals. - Increase physical activity, including walking and additional classes.  Major depressive disorder, recurrent, in partial remission with seasonal pattern Reports boredom and anticipates worsening symptoms in fall/winter. Currently on duloxetine  daily. Difficulty sleeping, uses Tylenol  PM. Hydroxyzine  suggested for sleep. 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 - Prescribe Wellbutrin  in the morning. - Try hydroxyzine  for sleep, starting with 20 mg, increase to 50 mg if needed. - Encourage social and outdoor activities. - Follow up with therapist [place].  Vitamin B12 deficiency Previously received B12 injections, currently on sublingual B12. B12 levels need re-evaluation. - Order B12 level test.  Vitamin D  deficiency Vitamin D  levels not checked since 2021, previously low. Started [employer] vitamin pack for women over 30. - Order vitamin D  level test.  Gastroesophageal reflux disease (GERD) Symptoms intermittent, managed with medication as needed. - Continue current management with medication as needed.

## 2023-12-31 NOTE — Progress Notes (Signed)
 Return pt here for allergy injections. Pt has epi pen for the appointment. No previous reactions to the injection were reported. Pt is well, no complaints. 3mm wheal noted after the 30 min wait period.

## 2024-01-02 ENCOUNTER — Ambulatory Visit: Payer: Self-pay | Admitting: Family Medicine

## 2024-01-07 ENCOUNTER — Ambulatory Visit

## 2024-01-07 DIAGNOSIS — Z2989 Encounter for other specified prophylactic measures: Secondary | ICD-10-CM | POA: Diagnosis not present

## 2024-01-07 DIAGNOSIS — J309 Allergic rhinitis, unspecified: Secondary | ICD-10-CM

## 2024-01-07 NOTE — Progress Notes (Signed)
 Return pt here for allergy injections. Pt has epi pen for the appointment. No previous reactions to the injection were reported. Pt is well, no complaints. No reaction noted after the 30 min wait period.

## 2024-01-14 ENCOUNTER — Ambulatory Visit (INDEPENDENT_AMBULATORY_CARE_PROVIDER_SITE_OTHER)

## 2024-01-14 DIAGNOSIS — J309 Allergic rhinitis, unspecified: Secondary | ICD-10-CM

## 2024-01-14 DIAGNOSIS — Z2989 Encounter for other specified prophylactic measures: Secondary | ICD-10-CM | POA: Diagnosis not present

## 2024-01-14 NOTE — Progress Notes (Signed)
 Return pt here for allergy injections. Pt has epi pen for the appointment. No previous reactions to the injection were reported. Pt is well, no complaints. No reaction noted after the 30 min wait period.

## 2024-01-21 ENCOUNTER — Ambulatory Visit

## 2024-01-21 DIAGNOSIS — J309 Allergic rhinitis, unspecified: Secondary | ICD-10-CM

## 2024-01-21 DIAGNOSIS — Z2989 Encounter for other specified prophylactic measures: Secondary | ICD-10-CM | POA: Diagnosis not present

## 2024-01-21 NOTE — Progress Notes (Signed)
 Return pt here for allergy injections. Pt has epi pen for the appointment. No previous reactions to the injection were reported. Pt is well, no complaints. No reaction noted after the 30 min wait period.

## 2024-01-23 ENCOUNTER — Other Ambulatory Visit: Payer: Self-pay | Admitting: Family Medicine

## 2024-01-23 DIAGNOSIS — F325 Major depressive disorder, single episode, in full remission: Secondary | ICD-10-CM

## 2024-01-23 DIAGNOSIS — F411 Generalized anxiety disorder: Secondary | ICD-10-CM

## 2024-01-28 ENCOUNTER — Ambulatory Visit (INDEPENDENT_AMBULATORY_CARE_PROVIDER_SITE_OTHER)

## 2024-01-28 DIAGNOSIS — J309 Allergic rhinitis, unspecified: Secondary | ICD-10-CM | POA: Diagnosis not present

## 2024-01-28 NOTE — Progress Notes (Signed)
 Return pt here for allergy injections. Pt has epi pen for the appointment. No previous reactions to the injection were reported. Pt is well, no complaints. No reaction noted after the 30 min wait period.

## 2024-02-04 ENCOUNTER — Ambulatory Visit

## 2024-02-04 DIAGNOSIS — Z2989 Encounter for other specified prophylactic measures: Secondary | ICD-10-CM

## 2024-02-04 DIAGNOSIS — J309 Allergic rhinitis, unspecified: Secondary | ICD-10-CM

## 2024-02-04 NOTE — Progress Notes (Signed)
 Return pt here for allergy injections. Pt has epi pen for the appointment. No previous reactions to the injection were reported. Pt is well, no complaints. No reaction noted after the 30 min wait period.

## 2024-02-09 ENCOUNTER — Ambulatory Visit

## 2024-02-11 ENCOUNTER — Ambulatory Visit (INDEPENDENT_AMBULATORY_CARE_PROVIDER_SITE_OTHER)

## 2024-02-11 DIAGNOSIS — Z2989 Encounter for other specified prophylactic measures: Secondary | ICD-10-CM

## 2024-02-11 DIAGNOSIS — J309 Allergic rhinitis, unspecified: Secondary | ICD-10-CM | POA: Diagnosis not present

## 2024-02-11 NOTE — Progress Notes (Signed)
 Return pt here for allergy injections. Pt has epi pen for the appointment. No previous reactions to the injection were reported. Pt is well, no complaints. No reaction noted after the 30 min wait period.

## 2024-02-15 ENCOUNTER — Ambulatory Visit (INDEPENDENT_AMBULATORY_CARE_PROVIDER_SITE_OTHER)

## 2024-02-15 DIAGNOSIS — Z23 Encounter for immunization: Secondary | ICD-10-CM | POA: Diagnosis not present

## 2024-02-15 NOTE — Progress Notes (Signed)
 Pt here for Flu Vaccine only, not seen by provider this visit

## 2024-02-18 ENCOUNTER — Ambulatory Visit

## 2024-02-18 DIAGNOSIS — J309 Allergic rhinitis, unspecified: Secondary | ICD-10-CM | POA: Diagnosis not present

## 2024-02-18 DIAGNOSIS — Z2989 Encounter for other specified prophylactic measures: Secondary | ICD-10-CM

## 2024-02-18 NOTE — Progress Notes (Signed)
 Return pt here for allergy injections. Pt has epi pen for the appointment. No previous reactions to the injection were reported. Pt is well, no complaints. No reaction noted after the 30 min wait period.

## 2024-02-25 ENCOUNTER — Ambulatory Visit (INDEPENDENT_AMBULATORY_CARE_PROVIDER_SITE_OTHER)

## 2024-02-25 DIAGNOSIS — Z2989 Encounter for other specified prophylactic measures: Secondary | ICD-10-CM | POA: Diagnosis not present

## 2024-02-25 DIAGNOSIS — J309 Allergic rhinitis, unspecified: Secondary | ICD-10-CM

## 2024-02-25 NOTE — Progress Notes (Signed)
 Return pt here for allergy injections. Pt has epi pen for the appointment. No previous reactions to the injection were reported. Pt is well, no complaints. No reaction noted after the 30 min wait period.

## 2024-02-26 DIAGNOSIS — J301 Allergic rhinitis due to pollen: Secondary | ICD-10-CM | POA: Diagnosis not present

## 2024-03-03 ENCOUNTER — Ambulatory Visit

## 2024-03-03 DIAGNOSIS — Z2989 Encounter for other specified prophylactic measures: Secondary | ICD-10-CM

## 2024-03-03 DIAGNOSIS — J309 Allergic rhinitis, unspecified: Secondary | ICD-10-CM | POA: Diagnosis not present

## 2024-03-03 NOTE — Progress Notes (Signed)
 Return pt here for allergy injections. Pt has epi pen for the appointment. No previous reactions to the injection were reported. Pt is well, no complaints. 2mm wheal noted after the 30 min wait period.

## 2024-03-10 ENCOUNTER — Encounter: Payer: Self-pay | Admitting: Adult Health

## 2024-03-10 ENCOUNTER — Ambulatory Visit (INDEPENDENT_AMBULATORY_CARE_PROVIDER_SITE_OTHER): Payer: Self-pay | Admitting: Adult Health

## 2024-03-10 ENCOUNTER — Other Ambulatory Visit: Payer: Self-pay

## 2024-03-10 ENCOUNTER — Ambulatory Visit

## 2024-03-10 VITALS — BP 110/80 | HR 96 | Temp 97.7°F | Ht 63.0 in | Wt 162.0 lb

## 2024-03-10 DIAGNOSIS — Z516 Encounter for desensitization to allergens: Secondary | ICD-10-CM

## 2024-03-10 DIAGNOSIS — R300 Dysuria: Secondary | ICD-10-CM

## 2024-03-10 DIAGNOSIS — N3000 Acute cystitis without hematuria: Secondary | ICD-10-CM

## 2024-03-10 DIAGNOSIS — D069 Carcinoma in situ of cervix, unspecified: Secondary | ICD-10-CM | POA: Diagnosis not present

## 2024-03-10 MED ORDER — NITROFURANTOIN MONOHYD MACRO 100 MG PO CAPS: 100.0000 mg | ORAL_CAPSULE | Freq: Two times a day (BID) | ORAL | 0 refills | Status: DC

## 2024-03-10 NOTE — Progress Notes (Signed)
 Therapist, [facility] [address] [place], [place] [address]   Office Visit Note  Patient Name: Patricia Horn Date of Birth [birth year]  Medical Record number [id number]  Date of Service: 03/10/2024  Chief Complaint  Patient presents with   Urinary Frequency    Patient reports having urinary frequency since last week. She took Azo.     Urinary Frequency  Associated symptoms include frequency and urgency. Pertinent negatives include no flank pain or hematuria.   Pt is here for a sick visit. She reports since last week she has been having urinary frequency, hesitancy.  She has been having difficulty completely emptying her bladder.  She has had some mild abdominal pain. She took AZo for the last 2 days.  Denies fever, chills, flank pain   Current Medication:  Outpatient Encounter Medications as of 03/10/2024  Medication Sig   buPROPion  (WELLBUTRIN  XL) 150 MG 24 hr tablet Take 1 tablet (150 mg total) by mouth every morning.   Cholecalciferol (VITAMIN D3) 50 MCG (2000 UT) CAPS Take 1 capsule by mouth every morning.   DULoxetine  (CYMBALTA ) 30 MG capsule Take 3 capsules (90 mg total) by mouth daily.   EPINEPHrine  0.3 mg/0.3 mL IJ SOAJ injection Inject 0.3 mg into the muscle as needed for anaphylaxis.   hydrOXYzine  (ATARAX ) 10 MG tablet Take 1-2 tablets (10-20 mg total) by mouth daily. (Patient taking differently: Take 10-20 mg by mouth daily as needed.)   levocetirizine (XYZAL ) 5 MG tablet Take 1 tablet (5 mg total) by mouth every evening.   nitrofurantoin, macrocrystal-monohydrate, (MACROBID) 100 MG capsule Take 1 capsule (100 mg total) by mouth 2 (two) times daily.   norethindrone-ethinyl estradiol-FE (LARIN FE 1/20) 1-20 MG-MCG tablet Take 1 tablet by mouth daily. Skip placebo pills. Take continuously.   omeprazole  (PRILOSEC) 40 MG capsule Take 40 mg by mouth daily. (Patient taking differently: Take 40 mg by mouth daily as needed.)   semaglutide -weight management (WEGOVY ) 2.4  MG/0.75ML SOAJ SQ injection Inject 2.4 mg into the skin once a week.   Olopatadine-Mometasone (RYALTRIS ) 665-25 MCG/ACT SUSP Place 2 sprays into the nose 2 (two) times daily. (Patient not taking: Reported on 03/10/2024)   No facility-administered encounter medications on file as of 03/10/2024.      Medical History: Past Medical History:  Diagnosis Date   Allergy    Anxiety    Depression    Environmental allergies    Fibroid uterus    Tendinitis    Umbilical hernia    Uterine leiomyoma 03/02/2019     Vital Signs: BP 110/80   Pulse 96   Temp 97.7 F (36.5 C)   Ht 5' 3 (1.6 m)   Wt 162 lb (73.5 kg)   SpO2 98%   BMI 28.70 kg/m    Review of Systems  Gastrointestinal:  Positive for abdominal pain.  Genitourinary:  Positive for decreased urine volume, frequency and urgency. Negative for dysuria, flank pain and hematuria.    Physical Exam Vitals reviewed.  Abdominal:     Tenderness: There is abdominal tenderness in the suprapubic area. There is no right CVA tenderness or left CVA tenderness.    Assessment/Plan: 1. Acute cystitis without hematuria (Primary) Take Nitrofurantoin every 12 hours (twice a day) with food x 7d; Finish all antibiotics. Drink plenty of water. Avoid or limit alcohol and caffeine, which may make symptoms worse. You may take an over-the-counter pain reliever (i.e., AZO urinary pain relief, Tylenol ) as needed for pain next 1-2 days. Send secure  message to provider or schedule return appointment as needed for new/worsening symptoms (such as fever or abdominal pain) if your symptoms are not improving after 2-3 days taking antibiotics or if your symptoms do not completely resolve following antibiotics.  - nitrofurantoin, macrocrystal-monohydrate, (MACROBID) 100 MG capsule; Take 1 capsule (100 mg total) by mouth 2 (two) times daily.  Dispense: 14 capsule; Refill: 0  2. Dysuria - Urine Culture     General Counseling: Deazia verbalizes understanding of the  findings of todays visit and agrees with plan of treatment. I have discussed any further diagnostic evaluation that may be needed or ordered today. We also reviewed her medications today. she has been encouraged to call the office with any questions or concerns that should arise related to todays visit.   Orders Placed This Encounter  Procedures   Urine Culture    Meds ordered this encounter  Medications   nitrofurantoin, macrocrystal-monohydrate, (MACROBID) 100 MG capsule    Sig: Take 1 capsule (100 mg total) by mouth 2 (two) times daily.    Dispense:  14 capsule    Refill:  0    Time spent:15 Minutes    Juliene DOROTHA Howells AGNP-C Nurse Practitioner

## 2024-03-10 NOTE — Progress Notes (Signed)
 Return pt here for allergy injections. Pt has epi pen for the appointment. No previous reactions to the injection were reported. Pt is well, no complaints. No reaction noted after the 30 min wait period.

## 2024-03-12 LAB — URINE CULTURE

## 2024-03-14 ENCOUNTER — Ambulatory Visit: Payer: Self-pay | Admitting: Adult Health

## 2024-03-17 ENCOUNTER — Ambulatory Visit

## 2024-03-17 DIAGNOSIS — Z516 Encounter for desensitization to allergens: Secondary | ICD-10-CM | POA: Diagnosis not present

## 2024-03-17 NOTE — Progress Notes (Signed)
 Return pt here for allergy injections. Pt has epi pen for the appointment. No previous reactions to the injection were reported. Pt is well, no complaints. No reaction noted after the 30 min wait period.

## 2024-03-24 ENCOUNTER — Ambulatory Visit (INDEPENDENT_AMBULATORY_CARE_PROVIDER_SITE_OTHER)

## 2024-03-24 DIAGNOSIS — Z516 Encounter for desensitization to allergens: Secondary | ICD-10-CM | POA: Diagnosis not present

## 2024-03-24 NOTE — Progress Notes (Signed)
 Return pt here for allergy injections. Pt has epi pen for the appointment. No previous reactions to the injection were reported. Pt is well, no complaints. No reaction noted after the 30 min wait period.

## 2024-03-28 ENCOUNTER — Other Ambulatory Visit: Payer: Self-pay | Admitting: Family Medicine

## 2024-03-28 DIAGNOSIS — F338 Other recurrent depressive disorders: Secondary | ICD-10-CM

## 2024-03-29 NOTE — Telephone Encounter (Signed)
 Requested Prescriptions  Pending Prescriptions Disp Refills   buPROPion  (WELLBUTRIN  XL) 150 MG 24 hr tablet [Pharmacy Med Name: buPROPion  HCl ER (XL) 150 MG Oral Tablet Extended Release 24 Hour] 90 tablet 0    Sig: TAKE 1 TABLET BY MOUTH ONCE DAILY IN THE MORNING     Psychiatry: Antidepressants - bupropion  Passed - 03/29/2024 11:44 AM      Passed - Cr in normal range and within 360 days    Creat  Date Value Ref Range Status  12/31/2023 0.93 0.50 - 0.97 mg/dL Final         Passed - AST in normal range and within 360 days    AST  Date Value Ref Range Status  12/31/2023 13 10 - 30 U/L Final         Passed - ALT in normal range and within 360 days    ALT  Date Value Ref Range Status  12/31/2023 14 6 - 29 U/L Final         Passed - Completed PHQ-2 or PHQ-9 in the last 360 days      Passed - Last BP in normal range    BP Readings from Last 1 Encounters:  03/10/24 110/80         Passed - Valid encounter within last 6 months    Recent Outpatient Visits           2 months ago Major depression in remission   [facility] Glenard Mire, MD   6 months ago Major depression in remission   [facility] Glenard Mire, MD   9 months ago Obesity (BMI 30.0-34.9)   [facility] Sowles, Krichna, MD

## 2024-03-30 ENCOUNTER — Encounter: Payer: Self-pay | Admitting: Family Medicine

## 2024-03-30 ENCOUNTER — Ambulatory Visit: Admitting: Family Medicine

## 2024-03-30 VITALS — BP 102/70 | HR 96 | Resp 16 | Ht 63.0 in | Wt 155.8 lb

## 2024-03-30 DIAGNOSIS — J3089 Other allergic rhinitis: Secondary | ICD-10-CM

## 2024-03-30 DIAGNOSIS — F411 Generalized anxiety disorder: Secondary | ICD-10-CM | POA: Diagnosis not present

## 2024-03-30 DIAGNOSIS — Z8639 Personal history of other endocrine, nutritional and metabolic disease: Secondary | ICD-10-CM | POA: Diagnosis not present

## 2024-03-30 DIAGNOSIS — F338 Other recurrent depressive disorders: Secondary | ICD-10-CM

## 2024-03-30 DIAGNOSIS — J3081 Allergic rhinitis due to animal (cat) (dog) hair and dander: Secondary | ICD-10-CM

## 2024-03-30 DIAGNOSIS — E538 Deficiency of other specified B group vitamins: Secondary | ICD-10-CM

## 2024-03-30 DIAGNOSIS — F325 Major depressive disorder, single episode, in full remission: Secondary | ICD-10-CM | POA: Diagnosis not present

## 2024-03-30 DIAGNOSIS — K219 Gastro-esophageal reflux disease without esophagitis: Secondary | ICD-10-CM | POA: Diagnosis not present

## 2024-03-30 DIAGNOSIS — E559 Vitamin D deficiency, unspecified: Secondary | ICD-10-CM

## 2024-03-30 MED ORDER — DULOXETINE HCL 30 MG PO CPEP: 90.0000 mg | ORAL_CAPSULE | Freq: Every day | ORAL | 1 refills | Status: AC

## 2024-03-30 MED ORDER — WEGOVY 2.4 MG/0.75ML ~~LOC~~ SOAJ: 2.4000 mg | SUBCUTANEOUS | 0 refills | Status: AC

## 2024-03-30 NOTE — Progress Notes (Signed)
 Name: Patricia Horn   MRN: [id number]    DOB: [birth year]   Date:03/30/2024       Progress Note  Subjective  Chief Complaint  Chief Complaint  Patient presents with   Medical Management of Chronic Issues   Discussed the use of AI scribe software for clinical note transcription with the patient, who gave verbal consent to proceed.  History of Present Illness Patricia Horn is a 33 year old female who presents for follow-up on weight management and mental health.  She has lost seven pounds since her last visit, now weighing 155 pounds with a BMI of 27.6. This weight reduction is due to maintaining an exercise routine of working out twice a week and making dietary changes, such as eating soups and reducing coffee intake by substituting it with tea. She is currently taking Wegovy  2.4 mg for weight management, which she started after initially using Ozempic . Her highest recorded weight was 194 pounds in August 2023.  She has major depression and generalized anxiety disorder, with her depression previously in remission. Seasonal affective disorder affects her mental health, especially during winter, coinciding with the anniversary of her mother's death. She plans to check in with her therapist, [facility], in December. She is currently taking duloxetine  30 mg three times a day and Wellbutrin , with a recent refill requested. Hydroxyzine  is used as needed for anxiety.  She experiences reflux symptoms that are diet-dependent, particularly after consuming acidic foods like pizza. These symptoms are managed with omeprazole  as needed.  She had a recent episode of frequent urination, for which she was prescribed Macrobid , although the test results for a urinary tract infection were negative. She completed the course of antibiotics and her symptoms resolved.  For allergies, she takes Xyzal  as needed, particularly before allergy shots, and no longer uses a nasal spray.    Patient Active Problem List    Diagnosis Date Noted   Low serum vitamin B12 09/29/2023   MDD (major depressive disorder), recurrent episode, moderate (HCC) 10/22/2020   Environmental allergies 04/19/2020   Allergy to cats 04/19/2020   Uterine leiomyoma 03/02/2019   Bilateral hand pain 12/28/2017   Right carpal tunnel syndrome 11/10/2016   Urticaria 11/09/2015   Obesity (BMI 35.0-39.9 without comorbidity) 01/05/2015   Umbilical cyst 11/30/2014   Perennial allergic rhinitis 11/16/2014   GAD (generalized anxiety disorder) 10/06/2014   ANA positive 10/06/2014    Past Surgical History:  Procedure Laterality Date   HERNIA REPAIR  1995   umbilical    MYOMECTOMY  2015    Family History  Problem Relation Age of Onset   Hypertension Mother    Scleroderma Mother    Pulmonary fibrosis Mother    Asthma Mother    Cancer Mother     Social History   Tobacco Use   Smoking status: Never   Smokeless tobacco: Never  Substance Use Topics   Alcohol use: Yes    Alcohol/week: 1.0 standard drink of alcohol    Types: 1 Standard drinks or equivalent per week    Comment: ocassional     Current Outpatient Medications:    buPROPion  (WELLBUTRIN  XL) 150 MG 24 hr tablet, TAKE 1 TABLET BY MOUTH ONCE DAILY IN THE MORNING, Disp: 90 tablet, Rfl: 0   Cholecalciferol (VITAMIN D3) 50 MCG (2000 UT) CAPS, Take 1 capsule by mouth every morning., Disp: , Rfl:    DULoxetine  (CYMBALTA ) 30 MG capsule, Take 3 capsules (90 mg total) by mouth daily., Disp: 270 capsule, Rfl: 0  EPINEPHrine  0.3 mg/0.3 mL IJ SOAJ injection, Inject 0.3 mg into the muscle as needed for anaphylaxis., Disp: 1 each, Rfl: 5   hydrOXYzine  (ATARAX ) 10 MG tablet, Take 1-2 tablets (10-20 mg total) by mouth daily. (Patient taking differently: Take 10-20 mg by mouth daily as needed.), Disp: 90 tablet, Rfl: 0   levocetirizine (XYZAL ) 5 MG tablet, Take 1 tablet (5 mg total) by mouth every evening., Disp: 90 tablet, Rfl: 1   nitrofurantoin , macrocrystal-monohydrate, (MACROBID )  100 MG capsule, Take 1 capsule (100 mg total) by mouth 2 (two) times daily., Disp: 14 capsule, Rfl: 0   norethindrone-ethinyl estradiol-FE (LARIN FE 1/20) 1-20 MG-MCG tablet, Take 1 tablet by mouth daily. Skip placebo pills. Take continuously., Disp: 112 tablet, Rfl: 3   omeprazole  (PRILOSEC) 40 MG capsule, Take 40 mg by mouth daily. (Patient taking differently: Take 40 mg by mouth daily as needed.), Disp: , Rfl:    semaglutide -weight management (WEGOVY ) 2.4 MG/0.75ML SOAJ SQ injection, Inject 2.4 mg into the skin once a week., Disp: 9 mL, Rfl: 0   Olopatadine-Mometasone (RYALTRIS ) 665-25 MCG/ACT SUSP, Place 2 sprays into the nose 2 (two) times daily. (Patient not taking: Reported on 03/10/2024), Disp: 29 g, Rfl: 1  Allergies  Allergen Reactions   Peanut Oil Swelling    Eyelids swell.   Peanut-Containing Drug Products     I personally reviewed active problem list, medication list, allergies, family history with the patient/caregiver today.   ROS  Ten systems reviewed and is negative except as mentioned in HPI    Objective Physical Exam MEASUREMENTS: Weight- 155, BMI- 27.6. CONSTITUTIONAL: Patient appears well-developed and well-nourished.  No distress. HEENT: Head atraumatic, normocephalic, neck supple. CARDIOVASCULAR: Normal rate, regular rhythm and normal heart sounds.  No murmur heard. No BLE edema. PULMONARY: Effort normal and breath sounds normal. No respiratory distress. ABDOMINAL: There is no tenderness or distention. MUSCULOSKELETAL: Normal gait. Without gross motor or sensory deficit. PSYCHIATRIC: Patient has a normal mood and affect. behavior is normal. Judgment and thought content normal.  Vitals:   03/30/24 0832  BP: 102/70  Pulse: 96  Resp: 16  SpO2: 97%  Weight: 155 lb 12.8 oz (70.7 kg)  Height: 5' 3 (1.6 m)    Body mass index is 27.6 kg/m.  Recent Results (from the past 2160 hours)  Comprehensive metabolic panel with GFR     Status: None   Collection Time:  12/31/23 10:23 AM  Result Value Ref Range   Glucose, Bld 99 65 - 99 mg/dL    Comment: .            Fasting reference interval .    BUN 12 7 - 25 mg/dL   Creat 9.06 9.49 - 9.02 mg/dL   eGFR 82 > OR = 60 fO/fpw/8.26f7   BUN/Creatinine Ratio SEE NOTE: 6 - 22 (calc)    Comment:    Not Reported: BUN and Creatinine are within    reference range. .    Sodium 138 135 - 146 mmol/L   Potassium 4.6 3.5 - 5.3 mmol/L   Chloride 105 98 - 110 mmol/L   CO2 25 20 - 32 mmol/L   Calcium 9.5 8.6 - 10.2 mg/dL   Total Protein 6.9 6.1 - 8.1 g/dL   Albumin 4.0 3.6 - 5.1 g/dL   Globulin 2.9 1.9 - 3.7 g/dL (calc)   AG Ratio 1.4 1.0 - 2.5 (calc)   Total Bilirubin 0.3 0.2 - 1.2 mg/dL   Alkaline phosphatase (APISO) 65 31 - 125 U/L  AST 13 10 - 30 U/L   ALT 14 6 - 29 U/L  CBC with Differential/Platelet     Status: Abnormal   Collection Time: 12/31/23 10:23 AM  Result Value Ref Range   WBC 7.3 3.8 - 10.8 Thousand/uL   RBC 4.00 3.80 - 5.10 Million/uL   Hemoglobin 12.0 11.7 - 15.5 g/dL   HCT 63.4 64.9 - 54.9 %   MCV 91.3 80.0 - 100.0 fL   MCH 30.0 27.0 - 33.0 pg   MCHC 32.9 32.0 - 36.0 g/dL    Comment: For adults, a slight decrease in the calculated MCHC value (in the range of 30 to 32 g/dL) is most likely not clinically significant; however, it should be interpreted with caution in correlation with other red cell parameters and the patient's clinical condition.    RDW 15.1 (H) 11.0 - 15.0 %   Platelets 417 (H) 140 - 400 Thousand/uL   MPV 9.1 7.5 - 12.5 fL   Neutro Abs 4,672 1,500 - 7,800 cells/uL   Absolute Lymphocytes 2,248 850 - 3,900 cells/uL   Absolute Monocytes 299 200 - 950 cells/uL   Eosinophils Absolute 58 15 - 500 cells/uL   Basophils Absolute 22 0 - 200 cells/uL   Neutrophils Relative % 64 %   Total Lymphocyte 30.8 %   Monocytes Relative 4.1 %   Eosinophils Relative 0.8 %   Basophils Relative 0.3 %  Lipid panel     Status: None   Collection Time: 12/31/23 10:23 AM  Result Value  Ref Range   Cholesterol 140 <200 mg/dL   HDL 57 > OR = 50 mg/dL   Triglycerides 96 <849 mg/dL   LDL Cholesterol (Calc) 65 mg/dL (calc)    Comment: Reference range: <100 . Desirable range <100 mg/dL for primary prevention;   <70 mg/dL for patients with CHD or diabetic patients  with > or = 2 CHD risk factors. SABRA LDL-C is now calculated using the Martin-Hopkins  calculation, which is a validated novel method providing  better accuracy than the Friedewald equation in the  estimation of LDL-C.  Gladis APPLETHWAITE et al. SANDREA. 7986;689(80): 2061-2068  (http://education.[facility].com/faq/FAQ164)    Total CHOL/HDL Ratio 2.5 <5.0 (calc)   Non-HDL Cholesterol (Calc) 83 <869 mg/dL (calc)    Comment: For patients with diabetes plus 1 major ASCVD risk  factor, treating to a non-HDL-C goal of <100 mg/dL  (LDL-C of <29 mg/dL) is considered a therapeutic  option.   B12 and Folate Panel     Status: None   Collection Time: 12/31/23 10:23 AM  Result Value Ref Range   Vitamin B-12 458 200 - 1,100 pg/mL   Folate >24.0 ng/mL    Comment:                            Reference Range                            Low:           <3.4                            Borderline:    3.4-5.4                            Normal:        >5.4 .  VITAMIN D  25 Hydroxy (Vit-D Deficiency, Fractures)     Status: None   Collection Time: 12/31/23 10:23 AM  Result Value Ref Range   Vit D, 25-Hydroxy 35 30 - 100 ng/mL    Comment: Vitamin D  Status         25-OH Vitamin D : . Deficiency:                    <20 ng/mL Insufficiency:             20 - 29 ng/mL Optimal:                 > or = 30 ng/mL . For 25-OH Vitamin D  testing on patients on  D2-supplementation and patients for whom quantitation  of D2 and D3 fractions is required, the [facility]ureD(TM) 25-OH VIT D, (D2,D3), LC/MS/MS is recommended: order  code 07111 (patients >22yrs). . See Note 1 . Note 1 . For additional information, please refer to   http://education.[facility].com/faq/FAQ199  (This link is being provided for informational/ educational purposes only.)   Urine Culture     Status: None   Collection Time: 03/10/24  9:08 AM   Specimen: Urine   Urine  Result Value Ref Range   Urine Culture, Routine Final report    Organism ID, Bacteria Comment     Comment: Mixed urogenital flora Less than 10,000 colonies/mL     Diabetic Foot Exam:     PHQ2/9:    03/30/2024    8:32 AM 12/31/2023    9:40 AM 09/30/2023    8:47 AM 07/01/2023    8:40 AM 05/08/2023    7:33 AM  Depression screen PHQ 2/9  Decreased Interest 0 1 0 0 0  Down, Depressed, Hopeless 0 1 0 0 0  PHQ - 2 Score 0 2 0 0 0  Altered sleeping 0 0 0 0 0  Tired, decreased energy 0 0 0 0 0  Change in appetite 0 0 0 0 0  Feeling bad or failure about yourself  0 0 0 0 0  Trouble concentrating 0 0 0 0 0  Moving slowly or fidgety/restless 0 0 0 0 0  Suicidal thoughts 0 0 0 0 0  PHQ-9 Score 0 2  0  0  0   Difficult doing work/chores Not difficult at all Somewhat difficult Not difficult at all Not difficult at all Not difficult at all     Data saved with a previous flowsheet row definition    phq 9 is negative  Fall Risk:    03/30/2024    8:32 AM 12/31/2023    9:38 AM 09/30/2023    8:47 AM 05/08/2023    7:33 AM 03/26/2023   11:47 AM  Fall Risk   Falls in the past year? 0 0 0 0 0  Number falls in past yr: 0 0 0 0   Injury with Fall? 0 0 0 0   Risk for fall due to : No Fall Risks No Fall Risks No Fall Risks No Fall Risks No Fall Risks  Follow up Falls evaluation completed Falls evaluation completed Falls prevention discussed;Education provided;Falls evaluation completed Falls prevention discussed;Education provided;Falls evaluation completed Falls prevention discussed     Assessment & Plan Obesity currently managed with Wegovy  Obesity well-managed with Wegovy , resulting in weight loss. Current weight 155 pounds, BMI 27.60. - Continue Wegovy  2.4 mg. -  Encouraged continued exercise and dietary modifications.  Major depressive disorder in full remission, generalized anxiety disorder, and seasonal  affective disorder Major depressive disorder in full remission. Generalized anxiety disorder and seasonal affective disorder present, exacerbated by winter and anniversary of mother's death. Discussed benefits of physical activity, nature exposure, and light therapy. - Encouraged physical activity and nature exposure. - Recommended light therapy with UV lights for 2 hours daily. - Plan to check in with therapist in December.  Gastroesophageal reflux disease Symptoms intermittent, related to dietary choices. Managed with omeprazole  as needed. - Continue omeprazole  as needed based on dietary intake.  Allergic rhinitis Managed with Xyzal  as needed. - Continue Xyzal  as needed.  General Health Maintenance Received flu shot.

## 2024-03-30 NOTE — Progress Notes (Deleted)
 Name: Patricia Horn   MRN: [id number]    DOB: 05-Nov-1990   Date:03/30/2024       Progress Note  Subjective  Chief Complaint  Chief Complaint  Patient presents with   Medical Management of Chronic Issues   Discussed the use of AI scribe software for clinical note transcription with the patient, who gave verbal consent to proceed.  History of Present Illness    *** Patient Active Problem List   Diagnosis Date Noted   Low serum vitamin B12 09/29/2023   MDD (major depressive disorder), recurrent episode, moderate (HCC) 10/22/2020   Environmental allergies 04/19/2020   Allergy to cats 04/19/2020   Uterine leiomyoma 03/02/2019   Bilateral hand pain 12/28/2017   Right carpal tunnel syndrome 11/10/2016   Urticaria 11/09/2015   Obesity (BMI 35.0-39.9 without comorbidity) 01/05/2015   Umbilical cyst 11/30/2014   Perennial allergic rhinitis 11/16/2014   GAD (generalized anxiety disorder) 10/06/2014   ANA positive 10/06/2014    Past Surgical History:  Procedure Laterality Date   HERNIA REPAIR  1995   umbilical    MYOMECTOMY  2015    Family History  Problem Relation Age of Onset   Hypertension Mother    Scleroderma Mother    Pulmonary fibrosis Mother    Asthma Mother    Cancer Mother     Social History   Tobacco Use   Smoking status: Never   Smokeless tobacco: Never  Substance Use Topics   Alcohol use: Yes    Alcohol/week: 1.0 standard drink of alcohol    Types: 1 Standard drinks or equivalent per week    Comment: ocassional     Current Outpatient Medications:    buPROPion  (WELLBUTRIN  XL) 150 MG 24 hr tablet, TAKE 1 TABLET BY MOUTH ONCE DAILY IN THE MORNING, Disp: 90 tablet, Rfl: 0   Cholecalciferol (VITAMIN D3) 50 MCG (2000 UT) CAPS, Take 1 capsule by mouth every morning., Disp: , Rfl:    EPINEPHrine  0.3 mg/0.3 mL IJ SOAJ injection, Inject 0.3 mg into the muscle as needed for anaphylaxis., Disp: 1 each, Rfl: 5   hydrOXYzine  (ATARAX ) 10 MG tablet, Take 1-2 tablets  (10-20 mg total) by mouth daily. (Patient taking differently: Take 10-20 mg by mouth daily as needed.), Disp: 90 tablet, Rfl: 0   levocetirizine (XYZAL ) 5 MG tablet, Take 1 tablet (5 mg total) by mouth every evening., Disp: 90 tablet, Rfl: 1   norethindrone-ethinyl estradiol-FE (LARIN FE 1/20) 1-20 MG-MCG tablet, Take 1 tablet by mouth daily. Skip placebo pills. Take continuously., Disp: 112 tablet, Rfl: 3   omeprazole  (PRILOSEC) 40 MG capsule, Take 40 mg by mouth daily. (Patient taking differently: Take 40 mg by mouth daily as needed.), Disp: , Rfl:    DULoxetine  (CYMBALTA ) 30 MG capsule, Take 3 capsules (90 mg total) by mouth daily., Disp: 270 capsule, Rfl: 1   semaglutide -weight management (WEGOVY ) 2.4 MG/0.75ML SOAJ SQ injection, Inject 2.4 mg into the skin once a week., Disp: 9 mL, Rfl: 0  Allergies  Allergen Reactions   Peanut Oil Swelling    Eyelids swell.   Peanut-Containing Drug Products     I personally reviewed active problem list, medication list, allergies, family history with the patient/caregiver today.   ROS  Ten systems reviewed and is negative except as mentioned in HPI    Objective Physical Exam   Vitals:   03/30/24 0832  BP: 102/70  Pulse: 96  Resp: 16  SpO2: 97%  Weight: 155 lb 12.8 oz (70.7 kg)  Height: 5' 3 (1.6 m)    Body mass index is 27.6 kg/m.  Recent Results (from the past 2160 hours)  Comprehensive metabolic panel with GFR     Status: None   Collection Time: 12/31/23 10:23 AM  Result Value Ref Range   Glucose, Bld 99 65 - 99 mg/dL    Comment: .            Fasting reference interval .    BUN 12 7 - 25 mg/dL   Creat 9.06 9.49 - 9.02 mg/dL   eGFR 82 > OR = 60 fO/fpw/8.26f7   BUN/Creatinine Ratio SEE NOTE: 6 - 22 (calc)    Comment:    Not Reported: BUN and Creatinine are within    reference range. .    Sodium 138 135 - 146 mmol/L   Potassium 4.6 3.5 - 5.3 mmol/L   Chloride 105 98 - 110 mmol/L   CO2 25 20 - 32 mmol/L   Calcium 9.5  8.6 - 10.2 mg/dL   Total Protein 6.9 6.1 - 8.1 g/dL   Albumin 4.0 3.6 - 5.1 g/dL   Globulin 2.9 1.9 - 3.7 g/dL (calc)   AG Ratio 1.4 1.0 - 2.5 (calc)   Total Bilirubin 0.3 0.2 - 1.2 mg/dL   Alkaline phosphatase (APISO) 65 31 - 125 U/L   AST 13 10 - 30 U/L   ALT 14 6 - 29 U/L  CBC with Differential/Platelet     Status: Abnormal   Collection Time: 12/31/23 10:23 AM  Result Value Ref Range   WBC 7.3 3.8 - 10.8 Thousand/uL   RBC 4.00 3.80 - 5.10 Million/uL   Hemoglobin 12.0 11.7 - 15.5 g/dL   HCT 63.4 64.9 - 54.9 %   MCV 91.3 80.0 - 100.0 fL   MCH 30.0 27.0 - 33.0 pg   MCHC 32.9 32.0 - 36.0 g/dL    Comment: For adults, a slight decrease in the calculated MCHC value (in the range of 30 to 32 g/dL) is most likely not clinically significant; however, it should be interpreted with caution in correlation with other red cell parameters and the patient's clinical condition.    RDW 15.1 (H) 11.0 - 15.0 %   Platelets 417 (H) 140 - 400 Thousand/uL   MPV 9.1 7.5 - 12.5 fL   Neutro Abs 4,672 1,500 - 7,800 cells/uL   Absolute Lymphocytes 2,248 850 - 3,900 cells/uL   Absolute Monocytes 299 200 - 950 cells/uL   Eosinophils Absolute 58 15 - 500 cells/uL   Basophils Absolute 22 0 - 200 cells/uL   Neutrophils Relative % 64 %   Total Lymphocyte 30.8 %   Monocytes Relative 4.1 %   Eosinophils Relative 0.8 %   Basophils Relative 0.3 %  Lipid panel     Status: None   Collection Time: 12/31/23 10:23 AM  Result Value Ref Range   Cholesterol 140 <200 mg/dL   HDL 57 > OR = 50 mg/dL   Triglycerides 96 <849 mg/dL   LDL Cholesterol (Calc) 65 mg/dL (calc)    Comment: Reference range: <100 . Desirable range <100 mg/dL for primary prevention;   <70 mg/dL for patients with CHD or diabetic patients  with > or = 2 CHD risk factors. SABRA LDL-C is now calculated using the Martin-Hopkins  calculation, which is a validated novel method providing  better accuracy than the Friedewald equation in the   estimation of LDL-C.  Gladis APPLETHWAITE et al. SANDREA. 7986;689(80): 2061-2068  (http://education.[facility].com/faq/FAQ164)  Total CHOL/HDL Ratio 2.5 <5.0 (calc)   Non-HDL Cholesterol (Calc) 83 <869 mg/dL (calc)    Comment: For patients with diabetes plus 1 major ASCVD risk  factor, treating to a non-HDL-C goal of <100 mg/dL  (LDL-C of <29 mg/dL) is considered a therapeutic  option.   B12 and Folate Panel     Status: None   Collection Time: 12/31/23 10:23 AM  Result Value Ref Range   Vitamin B-12 458 200 - 1,100 pg/mL   Folate >24.0 ng/mL    Comment:                            Reference Range                            Low:           <3.4                            Borderline:    3.4-5.4                            Normal:        >5.4 .   VITAMIN D  25 Hydroxy (Vit-D Deficiency, Fractures)     Status: None   Collection Time: 12/31/23 10:23 AM  Result Value Ref Range   Vit D, 25-Hydroxy 35 30 - 100 ng/mL    Comment: Vitamin D  Status         25-OH Vitamin D : . Deficiency:                    <20 ng/mL Insufficiency:             20 - 29 ng/mL Optimal:                 > or = 30 ng/mL . For 25-OH Vitamin D  testing on patients on  D2-supplementation and patients for whom quantitation  of D2 and D3 fractions is required, the [facility]ureD(TM) 25-OH VIT D, (D2,D3), LC/MS/MS is recommended: order  code 07111 (patients >21yrs). . See Note 1 . Note 1 . For additional information, please refer to  http://education.[facility].com/faq/FAQ199  (This link is being provided for informational/ educational purposes only.)   Urine Culture     Status: None   Collection Time: 03/10/24  9:08 AM   Specimen: Urine   Urine  Result Value Ref Range   Urine Culture, Routine Final report    Organism ID, Bacteria Comment     Comment: Mixed urogenital flora Less than 10,000 colonies/mL     Diabetic Foot Exam: {Perform Simple Foot Exam  Perform Detailed exam:1} {Insert foot Exam  (Optional):30965}   PHQ2/9:    03/30/2024    8:32 AM 12/31/2023    9:40 AM 09/30/2023    8:47 AM 07/01/2023    8:40 AM 05/08/2023    7:33 AM  Depression screen PHQ 2/9  Decreased Interest 0 1 0 0 0  Down, Depressed, Hopeless 0 1 0 0 0  PHQ - 2 Score 0 2 0 0 0  Altered sleeping 0 0 0 0 0  Tired, decreased energy 0 0 0 0 0  Change in appetite 0 0 0 0 0  Feeling bad or failure about yourself  0 0 0 0 0  Trouble concentrating 0 0 0  0 0  Moving slowly or fidgety/restless 0 0 0 0 0  Suicidal thoughts 0 0 0 0 0  PHQ-9 Score 0 2  0  0  0   Difficult doing work/chores Not difficult at all Somewhat difficult Not difficult at all Not difficult at all Not difficult at all     Data saved with a previous flowsheet row definition    phq 9 is negative  Fall Risk:    03/30/2024    8:32 AM 12/31/2023    9:38 AM 09/30/2023    8:47 AM 05/08/2023    7:33 AM 03/26/2023   11:47 AM  Fall Risk   Falls in the past year? 0 0 0 0 0  Number falls in past yr: 0 0 0 0   Injury with Fall? 0 0 0 0   Risk for fall due to : No Fall Risks No Fall Risks No Fall Risks No Fall Risks No Fall Risks  Follow up Falls evaluation completed Falls evaluation completed Falls prevention discussed;Education provided;Falls evaluation completed Falls prevention discussed;Education provided;Falls evaluation completed Falls prevention discussed     Assessment and Plan Assessment & Plan

## 2024-04-07 ENCOUNTER — Ambulatory Visit

## 2024-04-07 DIAGNOSIS — Z516 Encounter for desensitization to allergens: Secondary | ICD-10-CM

## 2024-04-07 NOTE — Progress Notes (Signed)
 Return pt here for allergy injections. Pt has epi pen for the appointment. No previous reactions to the injection were reported. Pt is well, no complaints. No reaction noted after the 30 min wait period.

## 2024-04-12 DIAGNOSIS — F331 Major depressive disorder, recurrent, moderate: Secondary | ICD-10-CM | POA: Diagnosis not present

## 2024-04-14 ENCOUNTER — Ambulatory Visit

## 2024-04-14 DIAGNOSIS — Z516 Encounter for desensitization to allergens: Secondary | ICD-10-CM | POA: Diagnosis not present

## 2024-04-14 NOTE — Progress Notes (Signed)
 Return pt here for allergy injections. Pt has epi pen for the appointment. No previous reactions to the injection were reported. Pt is well, no complaints. No reaction noted after the 30 min wait period.

## 2024-04-21 DIAGNOSIS — J301 Allergic rhinitis due to pollen: Secondary | ICD-10-CM | POA: Diagnosis not present

## 2024-04-25 DIAGNOSIS — J301 Allergic rhinitis due to pollen: Secondary | ICD-10-CM | POA: Diagnosis not present

## 2024-05-02 DIAGNOSIS — J301 Allergic rhinitis due to pollen: Secondary | ICD-10-CM | POA: Diagnosis not present

## 2024-05-11 ENCOUNTER — Ambulatory Visit (INDEPENDENT_AMBULATORY_CARE_PROVIDER_SITE_OTHER)

## 2024-05-11 DIAGNOSIS — Z516 Encounter for desensitization to allergens: Secondary | ICD-10-CM

## 2024-05-11 NOTE — Progress Notes (Signed)
 Return pt here for allergy injections. Pt has epi pen for the appointment. No previous reactions to the injection were reported. Pt is well, no complaints. 3mm wheal noted after the 30 min wait period.

## 2024-05-18 ENCOUNTER — Ambulatory Visit

## 2024-05-18 DIAGNOSIS — Z516 Encounter for desensitization to allergens: Secondary | ICD-10-CM

## 2024-05-18 NOTE — Progress Notes (Signed)
 Return pt here for allergy injections. Pt has epi pen for the appointment. No previous reactions to the injection were reported. Pt is well, no complaints. No reaction noted after the 30 min wait period.

## 2024-05-25 ENCOUNTER — Ambulatory Visit

## 2024-06-01 ENCOUNTER — Ambulatory Visit

## 2024-06-01 DIAGNOSIS — Z516 Encounter for desensitization to allergens: Secondary | ICD-10-CM

## 2024-06-01 NOTE — Progress Notes (Signed)
 Return pt here for allergy injections. Pt has epi pen for the appointment. No previous reactions to the injection were reported. Pt is well, no complaints. No reaction noted after the 30 min wait period.

## 2024-06-02 ENCOUNTER — Encounter: Payer: Self-pay | Admitting: Family Medicine

## 2024-06-08 ENCOUNTER — Ambulatory Visit

## 2024-06-08 DIAGNOSIS — Z516 Encounter for desensitization to allergens: Secondary | ICD-10-CM

## 2024-06-08 NOTE — Progress Notes (Signed)
 Return pt here for allergy injections. Pt has epi pen for the appointment. No previous reactions to the injection were reported. Pt is well, no complaints. No reaction noted after the 30 min wait period.

## 2024-06-15 ENCOUNTER — Ambulatory Visit

## 2024-06-16 ENCOUNTER — Ambulatory Visit: Admitting: Family Medicine

## 2024-06-22 ENCOUNTER — Ambulatory Visit

## 2024-06-29 ENCOUNTER — Ambulatory Visit

## 2024-06-30 ENCOUNTER — Ambulatory Visit: Admitting: Family Medicine

## 2024-07-06 ENCOUNTER — Ambulatory Visit

## 2024-07-13 ENCOUNTER — Ambulatory Visit

## 2024-07-20 ENCOUNTER — Ambulatory Visit

## 2024-08-03 ENCOUNTER — Ambulatory Visit

## 2024-08-10 ENCOUNTER — Ambulatory Visit

## 2024-08-17 ENCOUNTER — Ambulatory Visit

## 2024-08-24 ENCOUNTER — Ambulatory Visit

## 2024-08-31 ENCOUNTER — Ambulatory Visit

## 2024-09-07 ENCOUNTER — Ambulatory Visit

## 2024-09-14 ENCOUNTER — Ambulatory Visit

## 2024-09-21 ENCOUNTER — Ambulatory Visit
